# Patient Record
Sex: Female | Born: 1940
Health system: Southern US, Community
[De-identification: ages and names within clinical notes are randomized; demographics above are authoritative.]

## PROBLEM LIST (undated history)

## (undated) DIAGNOSIS — L719 Rosacea, unspecified: Secondary | ICD-10-CM

## (undated) DIAGNOSIS — T8859XA Other complications of anesthesia, initial encounter: Secondary | ICD-10-CM

## (undated) DIAGNOSIS — E0590 Thyrotoxicosis, unspecified without thyrotoxic crisis or storm: Secondary | ICD-10-CM

## (undated) DIAGNOSIS — K589 Irritable bowel syndrome without diarrhea: Secondary | ICD-10-CM

## (undated) DIAGNOSIS — R232 Flushing: Secondary | ICD-10-CM

## (undated) DIAGNOSIS — M4802 Spinal stenosis, cervical region: Secondary | ICD-10-CM

## (undated) DIAGNOSIS — R519 Headache, unspecified: Secondary | ICD-10-CM

## (undated) DIAGNOSIS — M1611 Unilateral primary osteoarthritis, right hip: Secondary | ICD-10-CM

## (undated) DIAGNOSIS — I10 Essential (primary) hypertension: Secondary | ICD-10-CM

## (undated) DIAGNOSIS — E785 Hyperlipidemia, unspecified: Secondary | ICD-10-CM

## (undated) DIAGNOSIS — Z9889 Other specified postprocedural states: Secondary | ICD-10-CM

## (undated) DIAGNOSIS — M1990 Unspecified osteoarthritis, unspecified site: Secondary | ICD-10-CM

## (undated) DIAGNOSIS — I639 Cerebral infarction, unspecified: Secondary | ICD-10-CM

## (undated) DIAGNOSIS — I480 Paroxysmal atrial fibrillation: Secondary | ICD-10-CM

## (undated) DIAGNOSIS — T4145XA Adverse effect of unspecified anesthetic, initial encounter: Secondary | ICD-10-CM

## (undated) DIAGNOSIS — R51 Headache: Secondary | ICD-10-CM

## (undated) DIAGNOSIS — R112 Nausea with vomiting, unspecified: Secondary | ICD-10-CM

## (undated) DIAGNOSIS — K5900 Constipation, unspecified: Secondary | ICD-10-CM

## (undated) HISTORY — PX: EYE SURGERY: SHX253

## (undated) HISTORY — PX: BACK SURGERY: SHX140

## (undated) HISTORY — PX: CARPAL TUNNEL RELEASE: SHX101

## (undated) HISTORY — PX: ABDOMINAL HYSTERECTOMY: SHX81

## (undated) HISTORY — PX: CATARACT EXTRACTION, BILATERAL: SHX1313

## (undated) HISTORY — PX: TUBAL LIGATION: SHX77

## (undated) HISTORY — PX: HEMORRHOID SURGERY: SHX153

## (undated) HISTORY — PX: CHOLECYSTECTOMY: SHX55

---

## 2007-03-15 HISTORY — PX: CARPAL TUNNEL RELEASE: SHX101

## 2007-07-25 ENCOUNTER — Ambulatory Visit (HOSPITAL_BASED_OUTPATIENT_CLINIC_OR_DEPARTMENT_OTHER): Admission: RE | Admit: 2007-07-25 | Discharge: 2007-07-25 | Payer: Self-pay | Admitting: *Deleted

## 2008-11-26 ENCOUNTER — Ambulatory Visit (HOSPITAL_BASED_OUTPATIENT_CLINIC_OR_DEPARTMENT_OTHER): Admission: RE | Admit: 2008-11-26 | Discharge: 2008-11-26 | Payer: Self-pay | Admitting: *Deleted

## 2009-12-01 DIAGNOSIS — I639 Cerebral infarction, unspecified: Secondary | ICD-10-CM

## 2009-12-01 HISTORY — DX: Cerebral infarction, unspecified: I63.9

## 2010-02-02 ENCOUNTER — Encounter: Admission: RE | Admit: 2010-02-02 | Discharge: 2010-02-02 | Payer: Self-pay | Admitting: Family Medicine

## 2010-06-18 LAB — POCT I-STAT, CHEM 8
Creatinine, Ser: 0.9 mg/dL (ref 0.4–1.2)
Glucose, Bld: 90 mg/dL (ref 70–99)
HCT: 45 % (ref 36.0–46.0)
Hemoglobin: 15.3 g/dL — ABNORMAL HIGH (ref 12.0–15.0)
Potassium: 3.4 mEq/L — ABNORMAL LOW (ref 3.5–5.1)
Sodium: 140 mEq/L (ref 135–145)
TCO2: 29 mmol/L (ref 0–100)

## 2010-07-27 NOTE — Op Note (Signed)
NAMELAKENZIE, MCCLAFFERTY NO.:  [id number]   MEDICAL RECORD NO.:  [id number]          PATIENT TYPE:  AMB   LOCATION:  DSC                          FACILITY:  [facility]   PHYSICIAN:  Tennis Must Meyerdierks, M.D.DATE OF BIRTH:  October 03, 1940   DATE OF PROCEDURE:  07/25/2007  DATE OF DISCHARGE:                               OPERATIVE REPORT   PREOPERATIVE DIAGNOSIS:  Right carpal tunnel syndrome.   POSTOPERATIVE DIAGNOSIS:  Right carpal tunnel syndrome.   PROCEDURE:  Decompression median nerve, right carpal tunnel.   SURGEON:  Lowell Bouton, MD   ANESTHESIA:  General.   OPERATIVE FINDINGS:  The patient had no masses in the carpal canal.  The  motor branch of the nerve was intact.   PROCEDURE:  Under 0.5% Marcaine local anesthesia with a tourniquet on  the right arm, the right hand was prepped and draped in usual fashion  and after exsanguinating the limb, the tourniquet was inflated to 250  mmHg.  A 3-cm longitudinal incision was made in the palm just ulnar to  the thenar crease and carried through the subcutaneous tissues.  Bleeding points were coagulated.  Blunt dissection was carried through  the superficial palmar fascia down to the transverse carpal ligament.  Hemostat was then placed in the carpal canal up against the hook of  hamate and the transverse carpal ligament was divided on the ulnar  border of the median nerve.  The proximal end of the ligament was  divided with scissors after dissecting the nerve away from the  undersurface of the ligament.  The carpal canal was then palpated and  was found to be adequately decompressed.  The nerve was examined and the  motor branch was identified.  The wound was then irrigated copiously  with saline.  Marcaine 0.5% was placed in the skin edges for pain  control.  The wound was closed with 4-0 nylon sutures.  Sterile  dressings were applied followed by a volar wrist splint.  The patient  had the tourniquet  released with good circulation of the hand.  She went  to the recovery room awake in stable and good condition.      Lowell Bouton, M.D.  Electronically Signed     EMM/MEDQ  D:  07/25/2007  T:  07/26/2007  Job:  [id number]

## 2014-04-01 ENCOUNTER — Other Ambulatory Visit (HOSPITAL_COMMUNITY): Payer: Self-pay | Admitting: Orthopaedic Surgery

## 2014-04-09 ENCOUNTER — Encounter (HOSPITAL_COMMUNITY): Payer: Self-pay

## 2014-04-09 ENCOUNTER — Encounter (HOSPITAL_COMMUNITY)
Admission: RE | Admit: 2014-04-09 | Discharge: 2014-04-09 | Disposition: A | Discharge status: Home / Self Care | Payer: PPO | Source: Ambulatory Visit | Attending: Orthopaedic Surgery | Admitting: Orthopaedic Surgery

## 2014-04-09 ENCOUNTER — Other Ambulatory Visit (HOSPITAL_COMMUNITY): Payer: Self-pay | Admitting: *Deleted

## 2014-04-09 DIAGNOSIS — Z01810 Encounter for preprocedural cardiovascular examination: Secondary | ICD-10-CM | POA: Insufficient documentation

## 2014-04-09 DIAGNOSIS — R9431 Abnormal electrocardiogram [ECG] [EKG]: Secondary | ICD-10-CM | POA: Insufficient documentation

## 2014-04-09 DIAGNOSIS — I10 Essential (primary) hypertension: Secondary | ICD-10-CM | POA: Insufficient documentation

## 2014-04-09 DIAGNOSIS — Z01812 Encounter for preprocedural laboratory examination: Secondary | ICD-10-CM | POA: Insufficient documentation

## 2014-04-09 DIAGNOSIS — M48061 Spinal stenosis, lumbar region without neurogenic claudication: Secondary | ICD-10-CM

## 2014-04-09 DIAGNOSIS — Z87891 Personal history of nicotine dependence: Secondary | ICD-10-CM | POA: Insufficient documentation

## 2014-04-09 DIAGNOSIS — Z01818 Encounter for other preprocedural examination: Secondary | ICD-10-CM | POA: Insufficient documentation

## 2014-04-09 HISTORY — DX: Essential (primary) hypertension: I10

## 2014-04-09 HISTORY — DX: Cerebral infarction, unspecified: I63.9

## 2014-04-09 HISTORY — DX: Unspecified osteoarthritis, unspecified site: M19.90

## 2014-04-09 HISTORY — DX: Irritable bowel syndrome, unspecified: K58.9

## 2014-04-09 HISTORY — DX: Rosacea, unspecified: L71.9

## 2014-04-09 LAB — TYPE AND SCREEN
ABO/RH(D): A POS
Antibody Screen: NEGATIVE

## 2014-04-09 LAB — CBC
HCT: 43.7 % (ref 36.0–46.0)
HEMOGLOBIN: 15.3 g/dL — AB (ref 12.0–15.0)
MCH: 30.8 pg (ref 26.0–34.0)
MCHC: 35 g/dL (ref 30.0–36.0)
MCV: 88.1 fL (ref 78.0–100.0)
PLATELETS: 170 10*3/uL (ref 150–400)
RBC: 4.96 MIL/uL (ref 3.87–5.11)
RDW: 13.2 % (ref 11.5–15.5)
WBC: 8 10*3/uL (ref 4.0–10.5)

## 2014-04-09 LAB — COMPREHENSIVE METABOLIC PANEL
ALBUMIN: 4.1 g/dL (ref 3.5–5.2)
ALT: 26 U/L (ref 0–35)
ANION GAP: 8 (ref 5–15)
AST: 24 U/L (ref 0–37)
Alkaline Phosphatase: 77 U/L (ref 39–117)
BUN: 17 mg/dL (ref 6–23)
CALCIUM: 9.4 mg/dL (ref 8.4–10.5)
CHLORIDE: 107 mmol/L (ref 96–112)
CO2: 24 mmol/L (ref 19–32)
CREATININE: 0.8 mg/dL (ref 0.50–1.10)
GFR, EST AFRICAN AMERICAN: 83 mL/min — AB (ref >90)
GFR, EST NON AFRICAN AMERICAN: 71 mL/min — AB (ref >90)
GLUCOSE: 103 mg/dL — AB (ref 70–99)
POTASSIUM: 4.1 mmol/L (ref 3.5–5.1)
SODIUM: 139 mmol/L (ref 135–145)
TOTAL PROTEIN: 6.7 g/dL (ref 6.0–8.3)
Total Bilirubin: 1.3 mg/dL — ABNORMAL HIGH (ref 0.3–1.2)

## 2014-04-09 LAB — URINALYSIS, ROUTINE W REFLEX MICROSCOPIC
Bilirubin Urine: NEGATIVE
Glucose, UA: NEGATIVE mg/dL
Hgb urine dipstick: NEGATIVE
Ketones, ur: NEGATIVE mg/dL
LEUKOCYTES UA: NEGATIVE
Nitrite: NEGATIVE
PH: 5.5 (ref 5.0–8.0)
Protein, ur: NEGATIVE mg/dL
Specific Gravity, Urine: 1.021 (ref 1.005–1.030)
Urobilinogen, UA: 0.2 mg/dL (ref 0.0–1.0)

## 2014-04-09 LAB — SURGICAL PCR SCREEN
MRSA, PCR: NEGATIVE
Staphylococcus aureus: NEGATIVE

## 2014-04-09 LAB — PROTIME-INR
INR: 1.04 (ref 0.00–1.49)
PROTHROMBIN TIME: 13.7 s (ref 11.6–15.2)

## 2014-04-09 LAB — ABO/RH: ABO/RH(D): A POS

## 2014-04-09 NOTE — Pre-Procedure Instructions (Signed)
SALEM Booker  04/09/2014   Your procedure is scheduled on:  Wednesday, April 16, 2014 at 12:30 PM.   Report to [facility] Entrance "A" Admitting Office at 10:30 AM.   Call this number if you have problems the morning of surgery: [phone]               Any questions prior to day of surgery, please call [phone] between 8 & 4 PM.    Remember:   Do not eat food or drink liquids after midnight Tuesday, 04/15/14.  Take these medicines the morning of surgery with A SIP OF WATER: Bupropion (Wellbutrin), Dicyclomine (Bentyl) - if needed  Stop Aspirin as of today.   Do not wear jewelry, make-up or nail polish.  Do not wear lotions, powders, or perfumes. You may wear deodorant.  Do not shave 48 hours prior to surgery.   Do not bring valuables to the hospital.  [facility] is not responsible                  for any belongings or valuables.               Contacts, dentures or bridgework may not be worn into surgery.  Leave suitcase in the car. After surgery it may be brought to your room.  For patients admitted to the hospital, discharge time is determined by your                treatment team.            Special Instructions: [facility] - Preparing for Surgery  Before surgery, you can play an important role.  Because skin is not sterile, your skin needs to be as free of germs as possible.  You can reduce the number of germs on you skin by washing with CHG (chlorahexidine gluconate) soap before surgery.  CHG is an antiseptic cleaner which kills germs and bonds with the skin to continue killing germs even after washing.  Please DO NOT use if you have an allergy to CHG or antibacterial soaps.  If your skin becomes reddened/irritated stop using the CHG and inform your nurse when you arrive at Short Stay.  Do not shave (including legs and underarms) for at least 48 hours prior to the first CHG shower.  You may shave your face.  Please follow these instructions  carefully:   1.  Shower with CHG Soap the night before surgery and the                                morning of Surgery.  2.  If you choose to wash your hair, wash your hair first as usual with your       normal shampoo.  3.  After you shampoo, rinse your hair and body thoroughly to remove the                      Shampoo.  4.  Use CHG as you would any other liquid soap.  You can apply chg directly       to the skin and wash gently with scrungie or a clean washcloth.  5.  Apply the CHG Soap to your body ONLY FROM THE NECK DOWN.        Do not use on open wounds or open sores.  Avoid contact with your eyes, ears, mouth and genitals (private parts).  Wash genitals (private parts) with your normal soap.  6.  Wash thoroughly, paying special attention to the area where your surgery        will be performed.  7.  Thoroughly rinse your body with warm water from the neck down.  8.  DO NOT shower/wash with your normal soap after using and rinsing off       the CHG Soap.  9.  Pat yourself dry with a clean towel.            10.  Wear clean pajamas.            11.  Place clean sheets on your bed the night of your first shower and do not        sleep with pets.  Day of Surgery  Do not apply any lotions the morning of surgery.  Please wear clean clothes to the hospital.     Please read over the following fact sheets that you were given: Pain Booklet, Coughing and Deep Breathing, Blood Transfusion Information, MRSA Information and Surgical Site Infection Prevention

## 2014-04-09 NOTE — Progress Notes (Signed)
Pt denies chest pain or sob. States she had a stress test and Echo done in 2011 because she had some left "breast" pain and her physician wanted to rule out her heart. She states those were "fine". Requested copies of Echo, stress test and recent EKG from [facility] in [place].

## 2014-04-10 NOTE — Progress Notes (Addendum)
Anesthesia Chart Review:  Pt is 74 year old female scheduled for L4 Gill procedure, L4-5 decompression and fusion, cage, pedicle instrumentation on 04/16/2014 with Dr. Lorin Mercy.   Cardiologist is [facility] at [employer].   PMH includes: HTN, stroke 2011. Former smoker. BMI 34  Preoperative labs reviewed.    EKG: NSR. Inferior infarct , age undetermined. Poor R wave progression.  Pt reported to PAT RN had stress and echo in 2011 for L breast pain and results came back normal. Requested records from [facility]. Will update note when records received.   Willeen Cass, FNP-BC [facility] Short Stay Surgical Center/Anesthesiology Phone: [phone] 04/10/2014 3:13 PM   Addendum: Still awaiting records.   Discussed with Dr. Tobias Alexander. If stress and echo from 2011 normal, pt may proceed with surgery as scheduled.   Willeen Cass, FNP-BC [facility] Short Stay Surgical Center/Anesthesiology Phone: [phone] 04/11/2014 4:42 PM  Addendum 04/14/2014:  Received records from [place].   Stress echo 11/05/2013: No evidence stress-induced wall motion abnormalities. Poor exercise capacity.   If no changes, I anticipate pt can proceed with surgery as scheduled.   Willeen Cass, FNP-BC [facility] Short Stay Surgical Center/Anesthesiology Phone: [phone] 04/14/2014 2:51 PM

## 2014-04-15 MED ORDER — CEFAZOLIN SODIUM-DEXTROSE 2-3 GM-% IV SOLR
2.0000 g | INTRAVENOUS | Status: AC
  Administered 2014-04-16: 2 g via INTRAVENOUS
  Filled 2014-04-15: qty 50

## 2014-04-15 NOTE — H&P (Signed)
[facility]   A Division of [employer]   [address] [place], [place] [address] Telephone: [phone]  Fax: [phone]     PATIENT: Christina Booker, Christina Booker   MR#: [id number]  DOB: 28-Aug-1940   Visit Date: 03/18/2014     CHIEF COMPLAINT:  Neurogenic claudication and L4-5 severe stenosis with spondylolisthesis.   HISTORY OF PRESENT ILLNESS:  Patient returns with persistent problems with back pain, leg weakness, inability to stand more than 4 to 5 minutes, inability to walk more than a half block.  She gets relief with standing.  She is better if she is leaning over a grocery cart and can walk much further.  She has increased pain with extension, better with flexion.   MEDICATIONS:  Reviewed and include atorvastatin 20 mg daily, bupropion 150 mg daily, lisinopril 5 mg daily, aspirin 325 p.o. daily, vitamin D3 1000 mg b.i.d., MiraLAX p.r.n., Benefiber 1/2 tablespoon q. a.m., zolpidem 10 mg p.r.n., and dicyclomine 10 mg p.r.n.    ALLERGIES:  PENICILLIN which caused a rash.    PAST SURGICAL HISTORY:  Includes bilateral carpal tunnel releases, right in 2009 and left in 2010.  Tubal ligation in 1977, hysterectomy in 1989.  Impacted colon in 1994.  TIA in 2011.   FAMILY HISTORY:  Positive for diabetes, heart disease, multiple myeloma, and uterine cancer.   SOCIAL HISTORY:  Patient is married to her husband, Cletus Gash, who is with her today.  Patient is retired.  She used to smoke 1 pack per day for 16 years.  She drinks 2 or 3 drinks of alcohol a week.   REVIEW OF SYSTEMS:  Positive for arthritis, previous TIA, hypertension, and increased cholesterol.   PHYSICAL EXAMINATION:  Patient is alert and oriented, WD, WN, NAD.  Extraocular movements intact.  Reflexes are 2+ and symmetrical.  Upper extremity range of motion is full.  No evidence of synovitis.  Lungs:  Clear.  Heart:  Regular rate and rhythm.  Normal coordination of the upper extremities.  Hip range  of motion and FABER test are negative.  Quads and hamstrings are strong.  Previous plain radiographs showed some degenerative changes at the L4-5 level.  Dital pulses 2+.  Anterior tib EHL, gastrocsoleus, quads, and hamstrings are normal to testing.  Nail beds are normal.   IMAGING:  Old x-rays demonstrate a grade 1/2 spondylolisthesis at L4-5, slight calcification of the abdominal aorta just cephalad to the iliac bifurcation.  Mild spurring at L1-2 anteriorly.   MRI on 03/17/2014 shows left extraforaminal disk at L3-4 unchanged, 2 mm of retrolisthesis at L2-3 with some narrowing and stenosis.  L4-5 shows 5 mm-plus spondylolisthesis which is increased since the prior MRI 3 years ago.  Severe facet arthritis, significant severe effusion, hypertrophy in the ligaments, and severe compression of the thecal sac which is compressed and progressed.  She has bilateral lateral recess compression.  Moderate facet changes at L5-S1.   ASSESSMENT/PLAN:  Instability and stenosis L4-5, instrumented fusion with Artis Delay procedure L4-5, interbody spacer.  Patient's own bone.  Risks of surgery discussed.  All questions answered.  Risks of bleeding, infection, dural tear, pseudarthrosis, progression at adjacent level, reoperation, pedicle screw malposition all discussed.  Risks of anesthesia, potential for heart attack and death all discussed.  She is seeing Dr. Bea Graff and will need to have preoperative clearance.   For additional information please see handwritten notes, reports, orders and prescriptions in this chart.      Mark C. Lorin Mercy, M.D.  Auto-Authenticated by Thana Farr. Lorin Mercy, M.D.  MCY/lm DD: 03/20/2014  DT: 03/20/2014   cc: Gilford Rile, MD([employer])   [facility]([employer])

## 2014-04-16 ENCOUNTER — Inpatient Hospital Stay (HOSPITAL_COMMUNITY): Payer: PPO | Admitting: Anesthesiology

## 2014-04-16 ENCOUNTER — Inpatient Hospital Stay (HOSPITAL_COMMUNITY)
Admission: RE | Admit: 2014-04-16 | Discharge: 2014-04-19 | DRG: 460 | Disposition: A | Discharge status: Home Health | Payer: PPO | Source: Ambulatory Visit | Attending: Orthopaedic Surgery | Admitting: Orthopaedic Surgery

## 2014-04-16 ENCOUNTER — Encounter (HOSPITAL_COMMUNITY)
Admission: RE | Disposition: A | Discharge status: Home Health | Payer: PPO | Source: Ambulatory Visit | Attending: Orthopaedic Surgery

## 2014-04-16 ENCOUNTER — Encounter (HOSPITAL_COMMUNITY): Payer: Self-pay | Admitting: *Deleted

## 2014-04-16 ENCOUNTER — Inpatient Hospital Stay (HOSPITAL_COMMUNITY): Payer: PPO | Admitting: Emergency Medicine

## 2014-04-16 ENCOUNTER — Inpatient Hospital Stay (HOSPITAL_COMMUNITY): Payer: PPO

## 2014-04-16 DIAGNOSIS — M48061 Spinal stenosis, lumbar region without neurogenic claudication: Secondary | ICD-10-CM

## 2014-04-16 DIAGNOSIS — Z8673 Personal history of transient ischemic attack (TIA), and cerebral infarction without residual deficits: Secondary | ICD-10-CM | POA: Diagnosis not present

## 2014-04-16 DIAGNOSIS — Z87891 Personal history of nicotine dependence: Secondary | ICD-10-CM

## 2014-04-16 DIAGNOSIS — M4806 Spinal stenosis, lumbar region: Secondary | ICD-10-CM | POA: Diagnosis present

## 2014-04-16 DIAGNOSIS — M4316 Spondylolisthesis, lumbar region: Principal | ICD-10-CM | POA: Diagnosis present

## 2014-04-16 DIAGNOSIS — Z981 Arthrodesis status: Secondary | ICD-10-CM

## 2014-04-16 DIAGNOSIS — Z79899 Other long term (current) drug therapy: Secondary | ICD-10-CM

## 2014-04-16 DIAGNOSIS — Z7982 Long term (current) use of aspirin: Secondary | ICD-10-CM | POA: Diagnosis not present

## 2014-04-16 DIAGNOSIS — M549 Dorsalgia, unspecified: Secondary | ICD-10-CM | POA: Diagnosis present

## 2014-04-16 SURGERY — POSTERIOR LUMBAR FUSION 1 LEVEL
Anesthesia: General

## 2014-04-16 MED ORDER — PHENYLEPHRINE HCL 10 MG/ML IJ SOLN
10.0000 mg | INTRAVENOUS | Status: DC | PRN
  Administered 2014-04-16: 50 ug/min via INTRAVENOUS

## 2014-04-16 MED ORDER — PROMETHAZINE HCL 25 MG/ML IJ SOLN
INTRAMUSCULAR | Status: AC
  Administered 2014-04-16: 6.25 mg via INTRAVENOUS
  Filled 2014-04-16: qty 1

## 2014-04-16 MED ORDER — SUCCINYLCHOLINE CHLORIDE 20 MG/ML IJ SOLN
INTRAMUSCULAR | Status: AC
  Filled 2014-04-16: qty 1

## 2014-04-16 MED ORDER — CHLORHEXIDINE GLUCONATE 4 % EX LIQD
60.0000 mL | Freq: Once | CUTANEOUS | Status: DC
  Filled 2014-04-16: qty 60

## 2014-04-16 MED ORDER — GLYCOPYRROLATE 0.2 MG/ML IJ SOLN
INTRAMUSCULAR | Status: AC
Start: 2014-04-16 — End: 2014-04-16
  Filled 2014-04-16: qty 1

## 2014-04-16 MED ORDER — HYDROMORPHONE HCL 1 MG/ML IJ SOLN
0.2500 mg | INTRAMUSCULAR | Status: DC | PRN
  Administered 2014-04-16: 1 mg via INTRAVENOUS

## 2014-04-16 MED ORDER — SURGIFOAM 100 EX MISC
CUTANEOUS | Status: DC | PRN
  Administered 2014-04-16: 20000 mL via TOPICAL

## 2014-04-16 MED ORDER — POLYETHYLENE GLYCOL 3350 17 G PO PACK
17.0000 g | PACK | Freq: Every day | ORAL | Status: DC | PRN
  Administered 2014-04-18: 17 g via ORAL
  Filled 2014-04-16 (×2): qty 1

## 2014-04-16 MED ORDER — ONDANSETRON HCL 4 MG PO TABS: 4.0000 mg | ORAL_TABLET | Freq: Four times a day (QID) | ORAL | Status: DC | PRN

## 2014-04-16 MED ORDER — OXYCODONE-ACETAMINOPHEN 5-325 MG PO TABS
1.0000 | ORAL_TABLET | Freq: Four times a day (QID) | ORAL | Status: DC | PRN
  Administered 2014-04-16 – 2014-04-19 (×8): 1 via ORAL
  Filled 2014-04-16 (×9): qty 1

## 2014-04-16 MED ORDER — OXYCODONE HCL 5 MG PO TABS
5.0000 mg | ORAL_TABLET | ORAL | Status: DC | PRN
  Administered 2014-04-17 – 2014-04-19 (×6): 5 mg via ORAL
  Filled 2014-04-16 (×6): qty 1

## 2014-04-16 MED ORDER — KETOROLAC TROMETHAMINE 30 MG/ML IJ SOLN
30.0000 mg | Freq: Once | INTRAMUSCULAR | Status: AC
  Administered 2014-04-16: 30 mg via INTRAVENOUS

## 2014-04-16 MED ORDER — POTASSIUM CHLORIDE IN NACL 20-0.45 MEQ/L-% IV SOLN
INTRAVENOUS | Status: DC
  Administered 2014-04-17: via INTRAVENOUS
  Filled 2014-04-16 (×8): qty 1000

## 2014-04-16 MED ORDER — LACTATED RINGERS IV SOLN
INTRAVENOUS | Status: DC
  Administered 2014-04-16 (×2): via INTRAVENOUS

## 2014-04-16 MED ORDER — ROCURONIUM BROMIDE 100 MG/10ML IV SOLN
INTRAVENOUS | Status: DC | PRN
  Administered 2014-04-16: 40 mg via INTRAVENOUS

## 2014-04-16 MED ORDER — METOCLOPRAMIDE HCL 5 MG/ML IJ SOLN: 5.0000 mg | Freq: Three times a day (TID) | INTRAMUSCULAR | Status: DC | PRN

## 2014-04-16 MED ORDER — BUPIVACAINE-EPINEPHRINE (PF) 0.25% -1:200000 IJ SOLN
INTRAMUSCULAR | Status: AC
  Filled 2014-04-16: qty 30

## 2014-04-16 MED ORDER — PROPOFOL 10 MG/ML IV BOLUS
INTRAVENOUS | Status: DC | PRN
  Administered 2014-04-16: 140 mg via INTRAVENOUS

## 2014-04-16 MED ORDER — DICYCLOMINE HCL 10 MG PO CAPS
10.0000 mg | ORAL_CAPSULE | Freq: Two times a day (BID) | ORAL | Status: DC | PRN
  Administered 2014-04-19: 10 mg via ORAL
  Filled 2014-04-16: qty 1

## 2014-04-16 MED ORDER — SUFENTANIL CITRATE 50 MCG/ML IV SOLN
INTRAVENOUS | Status: AC
  Filled 2014-04-16: qty 1

## 2014-04-16 MED ORDER — BUPROPION HCL ER (XL) 150 MG PO TB24
150.0000 mg | ORAL_TABLET | Freq: Every morning | ORAL | Status: DC
  Administered 2014-04-17 – 2014-04-19 (×3): 150 mg via ORAL
  Filled 2014-04-16 (×3): qty 1

## 2014-04-16 MED ORDER — MIDAZOLAM HCL 5 MG/5ML IJ SOLN
INTRAMUSCULAR | Status: DC | PRN
  Administered 2014-04-16: 1 mg via INTRAVENOUS

## 2014-04-16 MED ORDER — KETOROLAC TROMETHAMINE 30 MG/ML IJ SOLN
INTRAMUSCULAR | Status: AC
  Administered 2014-04-16: 30 mg via INTRAVENOUS
  Filled 2014-04-16: qty 1

## 2014-04-16 MED ORDER — METHOCARBAMOL 500 MG PO TABS
500.0000 mg | ORAL_TABLET | Freq: Four times a day (QID) | ORAL | Status: DC | PRN
  Administered 2014-04-16 – 2014-04-19 (×7): 500 mg via ORAL
  Filled 2014-04-16 (×8): qty 1

## 2014-04-16 MED ORDER — PHENYLEPHRINE HCL 10 MG/ML IJ SOLN
INTRAMUSCULAR | Status: DC | PRN
  Administered 2014-04-16: 80 ug via INTRAVENOUS

## 2014-04-16 MED ORDER — METHOCARBAMOL 1000 MG/10ML IJ SOLN
500.0000 mg | Freq: Four times a day (QID) | INTRAVENOUS | Status: DC | PRN
  Filled 2014-04-16: qty 5

## 2014-04-16 MED ORDER — HYDROMORPHONE HCL 1 MG/ML IJ SOLN
0.5000 mg | INTRAMUSCULAR | Status: DC | PRN
  Administered 2014-04-17: 0.5 mg via INTRAVENOUS
  Filled 2014-04-16: qty 1

## 2014-04-16 MED ORDER — PROMETHAZINE HCL 25 MG/ML IJ SOLN
6.2500 mg | INTRAMUSCULAR | Status: DC | PRN
  Administered 2014-04-16: 6.25 mg via INTRAVENOUS

## 2014-04-16 MED ORDER — ONDANSETRON HCL 4 MG/2ML IJ SOLN
4.0000 mg | Freq: Four times a day (QID) | INTRAMUSCULAR | Status: DC | PRN
  Administered 2014-04-16: 4 mg via INTRAVENOUS
  Filled 2014-04-16: qty 2

## 2014-04-16 MED ORDER — SUFENTANIL CITRATE 50 MCG/ML IV SOLN
INTRAVENOUS | Status: DC | PRN
  Administered 2014-04-16: 10 ug via INTRAVENOUS
  Administered 2014-04-16 (×2): 5 ug via INTRAVENOUS
  Administered 2014-04-16: 20 ug via INTRAVENOUS

## 2014-04-16 MED ORDER — OXYCODONE HCL 5 MG/5ML PO SOLN: 5.0000 mg | Freq: Once | ORAL | Status: DC | PRN

## 2014-04-16 MED ORDER — LISINOPRIL 5 MG PO TABS
5.0000 mg | ORAL_TABLET | Freq: Every day | ORAL | Status: DC
  Administered 2014-04-16: 5 mg via ORAL
  Filled 2014-04-16 (×4): qty 1

## 2014-04-16 MED ORDER — THROMBIN 20000 UNITS EX SOLR
CUTANEOUS | Status: AC
  Filled 2014-04-16: qty 20000

## 2014-04-16 MED ORDER — BISACODYL 10 MG RE SUPP
10.0000 mg | Freq: Every day | RECTAL | Status: DC | PRN
  Administered 2014-04-18 – 2014-04-19 (×2): 10 mg via RECTAL
  Filled 2014-04-16 (×2): qty 1

## 2014-04-16 MED ORDER — GLYCOPYRROLATE 0.2 MG/ML IJ SOLN
INTRAMUSCULAR | Status: DC | PRN
  Administered 2014-04-16: 0.2 mg via INTRAVENOUS

## 2014-04-16 MED ORDER — BUPIVACAINE-EPINEPHRINE 0.25% -1:200000 IJ SOLN
INTRAMUSCULAR | Status: DC | PRN
  Administered 2014-04-16: 15 mL

## 2014-04-16 MED ORDER — PROPOFOL 10 MG/ML IV BOLUS
INTRAVENOUS | Status: AC
  Filled 2014-04-16: qty 20

## 2014-04-16 MED ORDER — EPHEDRINE SULFATE 50 MG/ML IJ SOLN
INTRAMUSCULAR | Status: DC | PRN
  Administered 2014-04-16: 10 mg via INTRAVENOUS
  Administered 2014-04-16 (×3): 5 mg via INTRAVENOUS

## 2014-04-16 MED ORDER — 0.9 % SODIUM CHLORIDE (POUR BTL) OPTIME
TOPICAL | Status: DC | PRN
  Administered 2014-04-16: 1000 mL

## 2014-04-16 MED ORDER — DOCUSATE SODIUM 100 MG PO CAPS
100.0000 mg | ORAL_CAPSULE | Freq: Two times a day (BID) | ORAL | Status: DC
  Administered 2014-04-16 – 2014-04-19 (×6): 100 mg via ORAL
  Filled 2014-04-16 (×6): qty 1

## 2014-04-16 MED ORDER — LIDOCAINE HCL (CARDIAC) 20 MG/ML IV SOLN
INTRAVENOUS | Status: DC | PRN
  Administered 2014-04-16: 100 mg via INTRAVENOUS

## 2014-04-16 MED ORDER — HYDROMORPHONE HCL 1 MG/ML IJ SOLN
INTRAMUSCULAR | Status: AC
  Administered 2014-04-16: 1 mg via INTRAVENOUS
  Filled 2014-04-16: qty 1

## 2014-04-16 MED ORDER — METOCLOPRAMIDE HCL 10 MG PO TABS: 5.0000 mg | ORAL_TABLET | Freq: Three times a day (TID) | ORAL | Status: DC | PRN

## 2014-04-16 MED ORDER — OXYCODONE HCL 5 MG PO TABS: 5.0000 mg | ORAL_TABLET | Freq: Once | ORAL | Status: DC | PRN

## 2014-04-16 MED ORDER — ATORVASTATIN CALCIUM 20 MG PO TABS
20.0000 mg | ORAL_TABLET | Freq: Every day | ORAL | Status: DC
  Administered 2014-04-16 – 2014-04-18 (×3): 20 mg via ORAL
  Filled 2014-04-16 (×4): qty 1

## 2014-04-16 SURGICAL SUPPLY — 67 items
BLADE SURG ROTATE 9660 (MISCELLANEOUS) IMPLANT
BUR ROUND FLUTED 4 SOFT TCH (BURR) ×2 IMPLANT
BUR ROUND FLUTED 4MM SOFT TCH (BURR) ×1
CAP SPINAL LOCKING TI (Cap) ×12 IMPLANT
CORDS BIPOLAR (ELECTRODE) ×3 IMPLANT
COVER SURGICAL LIGHT HANDLE (MISCELLANEOUS) ×3 IMPLANT
DERMABOND ADHESIVE PROPEN (GAUZE/BANDAGES/DRESSINGS) ×2
DERMABOND ADVANCED (GAUZE/BANDAGES/DRESSINGS) ×2
DERMABOND ADVANCED .7 DNX12 (GAUZE/BANDAGES/DRESSINGS) ×1 IMPLANT
DERMABOND ADVANCED .7 DNX6 (GAUZE/BANDAGES/DRESSINGS) ×1 IMPLANT
DRAPE C-ARM 42X72 X-RAY (DRAPES) ×3 IMPLANT
DRAPE MICROSCOPE LEICA (MISCELLANEOUS) ×3 IMPLANT
DRAPE SURG 17X23 STRL (DRAPES) ×9 IMPLANT
DRAPE TABLE COVER HEAVY DUTY (DRAPES) ×3 IMPLANT
DRSG EMULSION OIL 3X3 NADH (GAUZE/BANDAGES/DRESSINGS) ×3 IMPLANT
DRSG MEPILEX BORDER 4X4 (GAUZE/BANDAGES/DRESSINGS) ×3 IMPLANT
DRSG MEPILEX BORDER 4X8 (GAUZE/BANDAGES/DRESSINGS) ×3 IMPLANT
DRSG PAD ABDOMINAL 8X10 ST (GAUZE/BANDAGES/DRESSINGS) ×6 IMPLANT
DURAPREP 26ML APPLICATOR (WOUND CARE) ×3 IMPLANT
ELECT BLADE 4.0 EZ CLEAN MEGAD (MISCELLANEOUS) ×3
ELECT CAUTERY BLADE 6.4 (BLADE) ×3 IMPLANT
ELECT REM PT RETURN 9FT ADLT (ELECTROSURGICAL) ×3
ELECTRODE BLDE 4.0 EZ CLN MEGD (MISCELLANEOUS) ×1 IMPLANT
ELECTRODE REM PT RTRN 9FT ADLT (ELECTROSURGICAL) ×1 IMPLANT
EVACUATOR 1/8 PVC DRAIN (DRAIN) ×3 IMPLANT
GLOVE BIOGEL PI IND STRL 6.5 (GLOVE) ×1 IMPLANT
GLOVE BIOGEL PI IND STRL 8 (GLOVE) ×2 IMPLANT
GLOVE BIOGEL PI INDICATOR 6.5 (GLOVE) ×2
GLOVE BIOGEL PI INDICATOR 8 (GLOVE) ×4
GLOVE BIOGEL PI ORTHO PRO 7.5 (GLOVE) ×2
GLOVE ECLIPSE 7.0 STRL STRAW (GLOVE) ×3 IMPLANT
GLOVE ORTHO TXT STRL SZ7.5 (GLOVE) ×3 IMPLANT
GLOVE PI ORTHO PRO STRL 7.5 (GLOVE) ×1 IMPLANT
GOWN STRL REUS W/ TWL LRG LVL3 (GOWN DISPOSABLE) ×3 IMPLANT
GOWN STRL REUS W/TWL LRG LVL3 (GOWN DISPOSABLE) ×6
HEMOSTAT SURGICEL 2X14 (HEMOSTASIS) IMPLANT
KIT BASIN OR (CUSTOM PROCEDURE TRAY) ×3 IMPLANT
KIT POSITION SURG JACKSON T1 (MISCELLANEOUS) ×3 IMPLANT
KIT ROOM TURNOVER OR (KITS) ×3 IMPLANT
MANIFOLD NEPTUNE II (INSTRUMENTS) ×3 IMPLANT
NDL SUT .5 MAYO 1.404X.05X (NEEDLE) ×1 IMPLANT
NEEDLE MAYO TAPER (NEEDLE) ×2
NS IRRIG 1000ML POUR BTL (IV SOLUTION) ×3 IMPLANT
PACK LAMINECTOMY ORTHO (CUSTOM PROCEDURE TRAY) ×3 IMPLANT
PAD ARMBOARD 7.5X6 YLW CONV (MISCELLANEOUS) ×6 IMPLANT
PATTIES SURGICAL .5 X.5 (GAUZE/BANDAGES/DRESSINGS) ×3 IMPLANT
PATTIES SURGICAL .75X.75 (GAUZE/BANDAGES/DRESSINGS) IMPLANT
ROD 45MM (Rod) ×4 IMPLANT
ROD SPNL CVD 45X5.5XHRD NS (Rod) ×2 IMPLANT
SCREW MATRIX MIS 6.0X45MM (Screw) ×12 IMPLANT
SPACER CONCORDE CUR 5DEG L10MM (Orthopedic Implant) ×3 IMPLANT
SPONGE LAP 18X18 X RAY DECT (DISPOSABLE) ×3 IMPLANT
SPONGE LAP 4X18 X RAY DECT (DISPOSABLE) ×6 IMPLANT
SPONGE SURGIFOAM ABS GEL 100 (HEMOSTASIS) IMPLANT
STAPLER VISISTAT 35W (STAPLE) IMPLANT
SURGIFLO TRUKIT (HEMOSTASIS) IMPLANT
SUT BONE WAX W31G (SUTURE) ×3 IMPLANT
SUT VIC AB 2-0 CT1 27 (SUTURE) ×2
SUT VIC AB 2-0 CT1 TAPERPNT 27 (SUTURE) ×1 IMPLANT
SUT VICRYL 0 TIES 12 18 (SUTURE) ×3 IMPLANT
SUT VICRYL 4-0 PS2 18IN ABS (SUTURE) ×3 IMPLANT
SUT VICRYL AB 2 0 TIES (SUTURE) ×3 IMPLANT
TOWEL OR 17X24 6PK STRL BLUE (TOWEL DISPOSABLE) ×3 IMPLANT
TOWEL OR 17X26 10 PK STRL BLUE (TOWEL DISPOSABLE) ×3 IMPLANT
TRAY FOLEY CATH 16FRSI W/METER (SET/KITS/TRAYS/PACK) ×3 IMPLANT
WATER STERILE IRR 1000ML POUR (IV SOLUTION) ×3 IMPLANT
YANKAUER SUCT BULB TIP NO VENT (SUCTIONS) ×3 IMPLANT

## 2014-04-16 NOTE — Transfer of Care (Signed)
Immediate Anesthesia Transfer of Care Note  Patient: Christina Booker  Procedure(s) Performed: Procedure(s): L4 Gill Procedure, L4-5 Decompression and Fusion, Cage, Pedicle Instrumentation (N/A)  Patient Location: PACU  Anesthesia Type:General  Level of Consciousness: awake, alert  and oriented  Airway & Oxygen Therapy: Patient Spontanous Breathing and Patient connected to nasal cannula oxygen  Post-op Assessment: Report given to RN, Post -op Vital signs reviewed and stable and Patient moving all extremities  Post vital signs: Reviewed and stable  Last Vitals:  Filed Vitals:   04/16/14 1023  BP: 175/78  Pulse: 70  Temp: 36.6 C  Resp: 20    Complications: No apparent anesthesia complications

## 2014-04-16 NOTE — Anesthesia Preprocedure Evaluation (Addendum)
Anesthesia Evaluation  Patient identified by MRN, date of birth, ID band Patient awake    Reviewed: Allergy & Precautions, NPO status , Patient's Chart, lab work & pertinent test results  Airway Mallampati: II  TM Distance: >3 FB Neck ROM: Full    Dental   Pulmonary former smoker,  breath sounds clear to auscultation        Cardiovascular hypertension, Pt. on medications Rhythm:Regular Rate:Normal  8/15 Normal stress test with no evidence inducible ischemia.   Neuro/Psych TIA   GI/Hepatic negative GI ROS, Neg liver ROS,   Endo/Other  Morbid obesity  Renal/GU negative Renal ROS     Musculoskeletal  (+) Arthritis -,   Abdominal   Peds  Hematology negative hematology ROS (+)   Anesthesia Other Findings   Reproductive/Obstetrics                            Anesthesia Physical Anesthesia Plan  ASA: III  Anesthesia Plan: General   Post-op Pain Management:    Induction: Intravenous  Airway Management Planned: Oral ETT  Additional Equipment:   Intra-op Plan:   Post-operative Plan: Extubation in OR  Informed Consent: I have reviewed the patients History and Physical, chart, labs and discussed the procedure including the risks, benefits and alternatives for the proposed anesthesia with the patient or authorized representative who has indicated his/her understanding and acceptance.     Plan Discussed with: CRNA  Anesthesia Plan Comments:         Anesthesia Quick Evaluation

## 2014-04-16 NOTE — Interval H&P Note (Signed)
History and Physical Interval Note:  04/16/2014 12:27 PM  Christina Booker  has presented today for surgery, with the diagnosis of L4-5 Severe Stenosis, Spondylolisthesis  The various methods of treatment have been discussed with the patient and family. After consideration of risks, benefits and other options for treatment, the patient has consented to  Procedure(s): L4 Gill Procedure, L4-5 Decompression and Fusion, Cage, Pedicle Instrumentation (N/A) as a surgical intervention .  The patient's history has been reviewed, patient examined, no change in status, stable for surgery.  I have reviewed the patient's chart and labs.  Questions were answered to the patient's satisfaction.     Caitrin Pendergraph C

## 2014-04-16 NOTE — Brief Op Note (Signed)
04/16/2014  4:35 PM  PATIENT:  Christina Booker  74 y.o. female  PRE-OPERATIVE DIAGNOSIS:  L4-5 Severe Stenosis, Spondylolisthesis  POST-OPERATIVE DIAGNOSIS:  L4-5 Severe Stenosis, Spondylolisthesis  PROCEDURE:  Procedure(s): L4 Gill Procedure, L4-5 Decompression and Fusion, Cage, Pedicle Instrumentation (N/A)  SURGEON:  Surgeon(s) and Role:    * Marybelle Killings, MD - Primary  PHYSICIAN ASSISTANT: Benjiman Core pa-c   ANESTHESIA:   general  EBL:  Total I/O In: 6546 [I.V.:1000; Blood:450] Out: 1125 [Urine:175; Blood:950]   DRAINS: none   LOCAL MEDICATIONS USED:  MARCAINE     SPECIMEN:  No Specimen  DISPOSITION OF SPECIMEN:  N/A  COUNTS:  YES  TOURNIQUET:  * No tourniquets in log *

## 2014-04-16 NOTE — Progress Notes (Signed)
Orthopedic Tech Progress Note Patient Details:  Christina Booker 03/27/1940 [id number] Called brace order in to [employer]. Patient ID: [name], female   DOB: 03-May-1940, 74 y.o.   MRN: [id number]   Darrol Poke 04/16/2014, 5:09 PM

## 2014-04-16 NOTE — Anesthesia Postprocedure Evaluation (Signed)
Anesthesia Post Note  Patient: Christina Booker  Procedure(s) Performed: Procedure(s) (LRB): L4 Gill Procedure, L4-5 Decompression and Fusion, Cage, Pedicle Instrumentation (N/A)  Anesthesia type: general  Patient location: PACU  Post pain: Pain level controlled  Post assessment: Patient's Cardiovascular Status Stable  Last Vitals:  Filed Vitals:   04/16/14 1730  BP: 145/86  Pulse: 82  Temp:   Resp:     Post vital signs: Reviewed and stable  Level of consciousness: sedated  Complications: No apparent anesthesia complications

## 2014-04-17 LAB — COMPREHENSIVE METABOLIC PANEL
ALBUMIN: 2.6 g/dL — AB (ref 3.5–5.2)
ALT: 21 U/L (ref 0–35)
ANION GAP: 8 (ref 5–15)
AST: 36 U/L (ref 0–37)
Alkaline Phosphatase: 50 U/L (ref 39–117)
BILIRUBIN TOTAL: 1.5 mg/dL — AB (ref 0.3–1.2)
BUN: 18 mg/dL (ref 6–23)
CHLORIDE: 106 mmol/L (ref 96–112)
CO2: 24 mmol/L (ref 19–32)
CREATININE: 1.49 mg/dL — AB (ref 0.50–1.10)
Calcium: 8 mg/dL — ABNORMAL LOW (ref 8.4–10.5)
GFR calc Af Amer: 39 mL/min — ABNORMAL LOW (ref >90)
GFR, EST NON AFRICAN AMERICAN: 34 mL/min — AB (ref >90)
Glucose, Bld: 127 mg/dL — ABNORMAL HIGH (ref 70–99)
Potassium: 4.2 mmol/L (ref 3.5–5.1)
SODIUM: 138 mmol/L (ref 135–145)
Total Protein: 4.5 g/dL — ABNORMAL LOW (ref 6.0–8.3)

## 2014-04-17 LAB — HEMOGLOBIN AND HEMATOCRIT, BLOOD
HCT: 35 % — ABNORMAL LOW (ref 36.0–46.0)
Hemoglobin: 11.4 g/dL — ABNORMAL LOW (ref 12.0–15.0)

## 2014-04-17 MED FILL — Sodium Chloride IV Soln 0.9%: INTRAVENOUS | Qty: 2000 | Status: AC

## 2014-04-17 MED FILL — Heparin Sodium (Porcine) Inj 1000 Unit/ML: INTRAMUSCULAR | Qty: 30 | Status: AC

## 2014-04-17 NOTE — Evaluation (Signed)
Occupational Therapy Evaluation Patient Details Name: Christina Booker MRN: [id number] DOB: 1940/11/09 Today's Date: 04/17/2014    History of Present Illness Patient is a 74 y/o female s/p L4 Gill Procedure, L4-5 Decompression and Fusion, Cage, Pedicle Instrumentation POD 1. PMH of HTN, stroke.   Clinical Impression   PTA pt lived at home with her husband and was independent with ADLs. Pt currently requires min A for functional mobility and assist for LB ADLs due to back pain and back precautions limiting independence. Pt will benefit from acute OT for ADL training and functional mobility.     Follow Up Recommendations  No OT follow up;Supervision/Assistance - 24 hour    Equipment Recommendations  3 in 1 bedside comode    Recommendations for Other Services       Precautions / Restrictions Precautions Precautions: Fall;Back Precaution Booklet Issued: Yes (comment) Precaution Comments: Reviewed handout and precautions. Required Braces or Orthoses: Spinal Brace Spinal Brace: Lumbar corset Restrictions Weight Bearing Restrictions: No      Mobility Bed Mobility Overal bed mobility: Needs Assistance Bed Mobility: Rolling;Sidelying to Sit Rolling: Min guard Sidelying to sit: Min guard;HOB elevated       General bed mobility comments: Min guard and VC's for sequencing and technique.   Transfers Overall transfer level: Needs assistance Equipment used: Rolling walker (2 wheeled) Transfers: Sit to/from Stand Sit to Stand: Min assist         General transfer comment: Min A to stand from chair with cues for anterior translation and hand placement. Increased effort to stand.    Balance Overall balance assessment: Needs assistance Sitting-balance support: Feet supported;No upper extremity supported Sitting balance-Leahy Scale: Fair     Standing balance support: During functional activity Standing balance-Leahy Scale: Poor Standing balance comment: requires BUE support  on RW for balance/safety.                            ADL Overall ADL's : Needs assistance/impaired Eating/Feeding: Independent;Sitting   Grooming: Set up;Sitting   Upper Body Bathing: Set up;Sitting   Lower Body Bathing: Moderate assistance;Sit to/from stand   Upper Body Dressing : Sitting;Moderate assistance (including back brace)   Lower Body Dressing: Maximal assistance;Sit to/from stand Lower Body Dressing Details (indicate cue type and reason): max A due to back precautions Toilet Transfer: Minimal assistance;Ambulation;RW Toilet Transfer Details (indicate cue type and reason): bed>recliner         Functional mobility during ADLs: Min guard;Rolling walker General ADL Comments: Educated pt on back precautions and incorporating into ADLs. Pt will benefit from further compensatory and ADL training               Pertinent Vitals/Pain Pain Assessment: 0-10 Pain Score: 5  Pain Location: back, surgical Pain Descriptors / Indicators: Sore Pain Intervention(s): Monitored during session;Repositioned     Hand Dominance Right   Extremity/Trunk Assessment Upper Extremity Assessment Upper Extremity Assessment: Overall WFL for tasks assessed   Lower Extremity Assessment Lower Extremity Assessment: Generalized weakness   Cervical / Trunk Assessment Cervical / Trunk Assessment: Normal   Communication Communication Communication: No difficulties   Cognition Arousal/Alertness: Awake/alert Behavior During Therapy: WFL for tasks assessed/performed Overall Cognitive Status: Within Functional Limits for tasks assessed                                Home Living Family/patient expects to be discharged to:: Private  residence Living Arrangements: Spouse/significant other Available Help at Discharge: Family;Available 24 hours/day Type of Home: House Home Access: Stairs to enter [employer] of Steps: 4 Entrance Stairs-Rails: Right Home  Layout: One level     Bathroom Shower/Tub: Occupational psychologist: Standard     Home Equipment: Environmental consultant - 2 wheels;Grab bars - toilet          Prior Functioning/Environment Level of Independence: Independent             OT Diagnosis: Generalized weakness;Acute pain   OT Problem List: Decreased strength;Decreased range of motion;Decreased activity tolerance;Impaired balance (sitting and/or standing);Decreased knowledge of precautions;Decreased knowledge of use of DME or AE;Pain   OT Treatment/Interventions: Self-care/ADL training;Therapeutic exercise;Energy conservation;DME and/or AE instruction;Therapeutic activities;Patient/family education;Balance training    OT Goals(Current goals can be found in the care plan section) Acute Rehab OT Goals Patient Stated Goal: to return home OT Goal Formulation: With patient Time For Goal Achievement: 05/01/14 Potential to Achieve Goals: Good ADL Goals Pt Will Perform Grooming: with supervision;standing Pt Will Perform Lower Body Bathing: with set-up;with supervision;with adaptive equipment;sit to/from stand Pt Will Perform Lower Body Dressing: with set-up;with supervision;with adaptive equipment;sit to/from stand Pt Will Transfer to Toilet: with supervision;ambulating Pt Will Perform Toileting - Clothing Manipulation and hygiene: with supervision;sit to/from stand Pt Will Perform Tub/Shower Transfer: Shower transfer;with supervision;ambulating;3 in 1;rolling walker Additional ADL Goal #1: Pt will don/doff back brace independently to prepare for ADLs.   OT Frequency: Min 2X/week    End of Session Equipment Utilized During Treatment: Gait belt;Rolling walker;Back brace Nurse Communication: Mobility status  Activity Tolerance: Patient tolerated treatment well Patient left: in chair;with call bell/phone within reach   Time: 1627-1652 OT Time Calculation (min): 25 min Charges:  OT General Charges $OT Visit: 1 Procedure OT  Evaluation $Initial OT Evaluation Tier I: 1 Procedure OT Treatments $Self Care/Home Management : 8-22 mins G-Codes:    Juluis Rainier 04-24-2014, 5:35 PM   Cyndie Chime, OTR/L Occupational Therapist [phone] (pager)

## 2014-04-17 NOTE — Progress Notes (Signed)
Orthopedic Tech Progress Note Patient Details:  Christina Booker 1940-10-28 [id number] Spoke with patient's nurse who stated patient already had back brace. No action needed from Ortho Tech at this time. Patient ID: Christina Booker, female   DOB: 1941/01/31, 74 y.o.   MRN: [id number]   Fenton Foy 04/17/2014, 11:07 AM

## 2014-04-17 NOTE — Progress Notes (Signed)
Subjective: 1 Day Post-Op Procedure(s) (LRB): L4 Gill Procedure, L4-5 Decompression and Fusion, Cage, Pedicle Instrumentation (N/A) Patient reports pain as 2 on 0-10 scale.    Objective: Vital signs in last 24 hours: Temp:  [97.8 F (36.6 C)-99 F (37.2 C)] 99 F (37.2 C) (02/04 0512) Pulse Rate:  [68-89] 71 (02/04 0512) Resp:  [10-20] 16 (02/04 0512) BP: (93-175)/(50-95) 105/50 mmHg (02/04 0512) SpO2:  [96 %-100 %] 96 % (02/04 0512) Weight:  [78.382 kg (172 lb 12.8 oz)] 78.382 kg (172 lb 12.8 oz) (02/03 1023)  Intake/Output from previous day: 02/03 0701 - 02/04 0700 In: 2384.8 [P.O.:240; I.V.:1694.8; Blood:450] Out: 1625 [Urine:675; Blood:950] Intake/Output this shift:    No results for input(s): HGB in the last 72 hours. No results for input(s): WBC, RBC, HCT, PLT in the last 72 hours. No results for input(s): NA, K, CL, CO2, BUN, CREATININE, GLUCOSE, CALCIUM in the last 72 hours. No results for input(s): LABPT, INR in the last 72 hours.  Neurologically intact  Assessment/Plan: 1 Day Post-Op Procedure(s) (LRB): L4 Gill Procedure, L4-5 Decompression and Fusion, Cage, Pedicle Instrumentation (N/A) Up with therapy H and H ordered.   Banita Lehn C 04/17/2014, 7:54 AM

## 2014-04-17 NOTE — Evaluation (Signed)
Physical Therapy Evaluation Patient Details Name: Christina Booker MRN: [id number] DOB: 1940/04/24 Today's Date: 04/17/2014   History of Present Illness  Patient is a 74 y/o female s/p L4 Gill Procedure, L4-5 Decompression and Fusion, Cage, Pedicle Instrumentation POD 1. PMH of HTN, stroke.    Clinical Impression  Patient presents with pain, generalized weakness and balance deficits s/p above surgery. Education provided on back precautions and pt able to verbalize 3/3 from OT session. Pt tolerated ambulation with Min guard assist for safety. Pt would benefit from skilled PT to improve transfers, gait, balance and mobility so pt can maximize independence and return to PLOF.    Follow Up Recommendations Home health PT;Supervision/Assistance - 24 hour    Equipment Recommendations  None recommended by PT    Recommendations for Other Services       Precautions / Restrictions Precautions Precautions: Fall;Back Precaution Booklet Issued: Yes (comment) Precaution Comments: Reviewed handout and precautions. Required Braces or Orthoses: Spinal Brace Spinal Brace: Lumbar corset Restrictions Weight Bearing Restrictions: No      Mobility  Bed Mobility               General bed mobility comments: Received sitting in chair upon PT arrival.  Transfers Overall transfer level: Needs assistance Equipment used: Rolling walker (2 wheeled) Transfers: Sit to/from Stand Sit to Stand: Min assist         General transfer comment: Min A to stand from chair with cues for anterior translation and hand placement. Increased effort to stand.  Ambulation/Gait Ambulation/Gait assistance: Min guard Ambulation Distance (Feet): 75 Feet Assistive device: Rolling walker (2 wheeled) Gait Pattern/deviations: Step-to pattern;Decreased stride length;Decreased step length - right;Decreased step length - left;Narrow base of support   Gait velocity interpretation: Below normal speed for  age/gender General Gait Details: Pt with slow, unsteady gait. Cues for RW proximity and upright posture.   Stairs            Wheelchair Mobility    Modified Rankin (Stroke Patients Only)       Balance Overall balance assessment: Needs assistance Sitting-balance support: Feet supported;No upper extremity supported Sitting balance-Leahy Scale: Fair     Standing balance support: During functional activity Standing balance-Leahy Scale: Poor Standing balance comment: requires BUE support on RW for balance/safety.                             Pertinent Vitals/Pain Pain Assessment: 0-10 Pain Score: 5  Pain Location: back at surgical site Pain Descriptors / Indicators: Sore Pain Intervention(s): Monitored during session;Repositioned    Home Living Family/patient expects to be discharged to:: Private residence Living Arrangements: Spouse/significant other Available Help at Discharge: Family;Available 24 hours/day Type of Home: House Home Access: Stairs to enter Entrance Stairs-Rails: Right Entrance Stairs-Number of Steps: 4 Home Layout: One level Home Equipment: Walker - 2 wheels      Prior Function Level of Independence: Independent               Hand Dominance        Extremity/Trunk Assessment   Upper Extremity Assessment: Defer to OT evaluation           Lower Extremity Assessment: Generalized weakness         Communication   Communication: No difficulties  Cognition Arousal/Alertness: Awake/alert Behavior During Therapy: WFL for tasks assessed/performed Overall Cognitive Status: Within Functional Limits for tasks assessed  General Comments      Exercises        Assessment/Plan    PT Assessment Patient needs continued PT services  PT Diagnosis Generalized weakness;Acute pain;Difficulty walking   PT Problem List Decreased strength;Pain;Decreased activity tolerance;Decreased balance;Decreased  mobility;Decreased knowledge of precautions  PT Treatment Interventions Balance training;Gait training;Stair training;DME instruction;Therapeutic activities;Therapeutic exercise;Patient/family education;Functional mobility training   PT Goals (Current goals can be found in the Care Plan section) Acute Rehab PT Goals Patient Stated Goal: to return home PT Goal Formulation: With patient Time For Goal Achievement: 05/01/14 Potential to Achieve Goals: Fair    Frequency Min 5X/week   Barriers to discharge        Co-evaluation               End of Session Equipment Utilized During Treatment: Gait belt;Back brace Activity Tolerance: Patient tolerated treatment well Patient left: in chair;with call bell/phone within reach (chair alarm pad under patient but no alarm box. Pt instructed to press call bell for assist with mobility.) Nurse Communication: Mobility status         Time: 1735-6701 PT Time Calculation (min) (ACUTE ONLY): 18 min   Charges:   PT Evaluation $Initial PT Evaluation Tier I: 1 Procedure     PT G CodesCandy Sledge A 05-04-14, 5:29 PM Candy Sledge, PT, DPT [phone]

## 2014-04-17 NOTE — Op Note (Signed)
NAMEYASLENE, LINDAMOOD NO.:  [id number]  MEDICAL RECORD NO.:  [id number]  LOCATION:  [id number]                        FACILITY:  [facility]  PHYSICIAN:  Jametta Moorehead C. Lorin Mercy, M.D.    DATE OF BIRTH:  [birth year]  DATE OF PROCEDURE:  04/16/2014 DATE OF DISCHARGE:                              OPERATIVE REPORT   PREOPERATIVE DIAGNOSES:  Unstable spondylolisthesis with severe spinal stenosis, lateral recess and foraminal stenosis.  POSTOPERATIVE DIAGNOSES:  Unstable spondylolisthesis with severe spinal stenosis, lateral recess and foraminal stenosis.  PROCEDURE:  L4 Gill procedure, bilateral right and left L5 laminotomy superiorly.  Resection of left L4-5 facet joint.  A 10-mm carbon PEEK interbody spacer with local bone graft.  Bilateral gutter fluid fusion. Pedicle instrumentation DePuy.  A 10-mm lordotic cord curved cage.  6 x 45 mm screws, 45-mm rods, Matrix screws.  SURGEON:  Chesney Klimaszewski C. Lorin Mercy, MD  ASSISTANT:  Benjiman Core, PA-C medically necessary and present for the entire procedure.  ANESTHESIA:  General plus Marcaine local.  COMPLICATIONS:  None.  EBL:  700.  BRIEF HISTORY:  This is a 74 year old female who has had progressive stenosis, symptoms of neurogenic claudication, instability with rocking on flexion and extension views at the L4-5 level greater than 3 mm with severe lateral recess stenosis, foraminal stenosis more left than right and severe central stenosis multifactorial.  DESCRIPTION OF PROCEDURE:  After induction of general anesthesia, the patient was placed on the spine frame, prone, yellow pads underneath the shoulders and arms at 90/ 90.  SCDs were applied.  DuraPrep, Ancef prophylaxis.  Area squared with towels, Betadine, Steri-Drape after sterile skin marker, laminectomy sheets and drapes and time-out procedure.  Midline incision was made over the palpable 4-5 level subperiosteal dissection.  Once the lamina was exposed, the step-off was noted and  instability with abnormal motion, and Kocher clamp was placed at  the planned level for decompression.  The top of spinous process of L4 and in the mid portion of L5 confirmed with cross-table lateral C-arm that had been sterilely draped.  Dissection out to the transverse processes of L4 and L5 right and left, placement of the  Viper retractor.  The posterior element of L4 lamina spinous process was resected.  It was meticulously cleaned by the scrub nurse while a decompression was performed.  There was large chunks of hypertrophic ligamentum which removed.  Operative microscope was draped and brought in.  Overhanging spurs and spurs from the side caused hourglass constriction and these spurs were resected.  Obvious step-off of 4-5 abnormal motion was performed.  Facet  was resected on the left in line with the disk space.  There were multiple epidural veins which were coagulated with bipolar cautery.  Combination of some thrombin, Gelfoam, and Surgicel was used.  Resection of the disk was performed with angled curettes.  The top portion of L5 right and left laminotomy was performed.  Due to the patient's anterolisthesis to give the dura more room with L5 laminotomy on right and left.  Lateral recess was completely decompressed.  Right and left bone removed at the level of the pedicle and foraminotomies performed on the right to free up the nerve root with  resection of the facet joint on the left.  Cleaning up the disk space angled curettes, straight curettes, rasp, straight pituitaries, wide pituitaries, up and down biting pituitaries were performed.  The ring curettes were used as well.  Endplates were bleeding.  Trial sizers marking up to a 10 which restored the height to aid in reduction and correction of the spondylolisthesis with greater than 50% correction.  A 10-mm Concorde cage was packed with bone in the middle.  The patient's bone had been cleaned, was packed anterior to  the cage, and the cage was pushed and kicked over and it was checked under fluoroscopy and was almost exactly in the midline.  Cage was countersunk.  There was good reduction.  Screws were then placed using the prong pedicle Feeler, tapping pedicle Feeler again using a hockey stick medially in the canal to make sure there is no penetration to the inferior aspect of the pedicle as well as none through the medial side of the pedicle.  Fluoroscopy was used checking with the prong first, then once the joystick had been advanced, 45 mm were selected after tapping, checking with the C-arm again, and then placement of the screw, and then final confirmation pictures showing the good screw position. This was repeated four times.  Before the screw was finally inserted, lateral transverse process was decorticated from the lateral fusion for additional securement due to the patient's significant instability.  The 45-mm rods were inserted, compressed.  On the right side, rod was not sticking out through the bottom, and so the screw caps were loosened and the rod was slid down and then was sticking out on both sides and re- compressed.  The left side where the cage had been inserted was compressed first followed by the opposite side.  There was good compression, good reduction, and final x-rays were taken.  Good position of the rods.  Bone was meticulously packed in the decorticated lateral gutter onto the transverse processes.  Canal was checked and there was minimal bleeding.  The Gelfoam was removed.  All patties were removed, repeat irrigation.  Fascia was closed with #1 Vicryl, 2-0 Vicryl in subcutaneous tissue, subcuticular closure.  Dermabond and postop dressing.  Instrument count and needle count was correct.     Layali Freund C. Lorin Mercy, M.D.     MCY/MEDQ  D:  04/16/2014  T:  04/17/2014  Job:  [id number]

## 2014-04-18 LAB — COMPREHENSIVE METABOLIC PANEL
ALT: 18 U/L (ref 0–35)
AST: 28 U/L (ref 0–37)
Albumin: 2.6 g/dL — ABNORMAL LOW (ref 3.5–5.2)
Alkaline Phosphatase: 47 U/L (ref 39–117)
Anion gap: 4 — ABNORMAL LOW (ref 5–15)
BILIRUBIN TOTAL: 1.4 mg/dL — AB (ref 0.3–1.2)
BUN: 17 mg/dL (ref 6–23)
CO2: 24 mmol/L (ref 19–32)
Calcium: 8 mg/dL — ABNORMAL LOW (ref 8.4–10.5)
Chloride: 108 mmol/L (ref 96–112)
Creatinine, Ser: 1.11 mg/dL — ABNORMAL HIGH (ref 0.50–1.10)
GFR calc Af Amer: 56 mL/min — ABNORMAL LOW (ref >90)
GFR, EST NON AFRICAN AMERICAN: 48 mL/min — AB (ref >90)
Glucose, Bld: 115 mg/dL — ABNORMAL HIGH (ref 70–99)
Potassium: 3.9 mmol/L (ref 3.5–5.1)
Sodium: 136 mmol/L (ref 135–145)
Total Protein: 4.4 g/dL — ABNORMAL LOW (ref 6.0–8.3)

## 2014-04-18 MED ORDER — OXYCODONE-ACETAMINOPHEN 10-325 MG PO TABS: 1.0000 | ORAL_TABLET | ORAL | Status: DC | PRN

## 2014-04-18 MED ORDER — ONDANSETRON 4 MG PO TBDP: 4.0000 mg | ORAL_TABLET | Freq: Three times a day (TID) | ORAL | Status: DC | PRN

## 2014-04-18 MED ORDER — METHOCARBAMOL 500 MG PO TABS: 500.0000 mg | ORAL_TABLET | Freq: Four times a day (QID) | ORAL | Status: DC | PRN

## 2014-04-18 NOTE — Progress Notes (Signed)
Utilization review completed. Latana Colin, RN, BSN. 

## 2014-04-18 NOTE — Progress Notes (Signed)
Subjective: Doing well.  Pain controlled.   Objective: Vital signs in last 24 hours: Temp:  [98.6 F (37 C)-98.9 F (37.2 C)] 98.7 F (37.1 C) (02/05 0508) Pulse Rate:  [69-78] 77 (02/05 0508) Resp:  [16-17] 16 (02/05 0508) BP: (83-93)/(40-53) 93/53 mmHg (02/05 0508) SpO2:  [90 %-95 %] 92 % (02/05 0621)  Intake/Output from previous day: 02/04 0701 - 02/05 0700 In: 960 [P.O.:960] Out: 300 [Urine:300] Intake/Output this shift:     Recent Labs  04/17/14 0908  HGB 11.4*    Recent Labs  04/17/14 0908  HCT 35.0*    Recent Labs  04/17/14 1026 04/18/14 0454  NA 138 136  K 4.2 3.9  CL 106 108  CO2 24 24  BUN 18 17  CREATININE 1.49* 1.11*  GLUCOSE 127* 115*  CALCIUM 8.0* 8.0*   No results for input(s): LABPT, INR in the last 72 hours.  Exam:  Wound looks good.  No drainage or signs of infection.  bilat calves nontender.  NVI.    Assessment/Plan: Will plan for discharge home tomorrow.  Discussed with patient and family members present.  Continue PT.  Scripts on chart.    Stacey Sago M 04/18/2014, 1:09 PM

## 2014-04-18 NOTE — Progress Notes (Signed)
Physical Therapy Treatment Patient Details Name: ANNAYAH WORTHLEY MRN: [id number] DOB: 01-07-1941 Today's Date: 04/18/2014    History of Present Illness Patient is a 74 y/o female s/p L4 Gill Procedure, L4-5 Decompression and Fusion, Cage, Pedicle Instrumentation POD 1. PMH of HTN, stroke.    PT Comments    Pt. Would benefit from transfer training to increase independence in mobility. Was upset by not being helped getting out of the chair before session, but her mood was fine during session. Took instruction well and is appropriate for d/c to home health.   Follow Up Recommendations  Home health PT;Supervision/Assistance - 24 hour     Equipment Recommendations  None recommended by PT    Recommendations for Other Services       Precautions / Restrictions Precautions Precautions: Fall;Back Precaution Booklet Issued: Yes (comment) Required Braces or Orthoses: Spinal Brace Spinal Brace: Lumbar corset Restrictions Weight Bearing Restrictions: No    Mobility  Bed Mobility Overal bed mobility: Needs Assistance Bed Mobility: Sit to Supine;Rolling Rolling: Min guard     Sit to supine: Min guard   General bed mobility comments: Min guard and VC's for sequencing and technique.   Transfers Overall transfer level: Needs assistance Equipment used: Rolling walker (2 wheeled) Transfers: Sit to/from Stand Sit to Stand: Min assist         General transfer comment: Min A to stand from chair with cues for anterior translation and hand placement. Increased effort to stand. prefers one hand on and one hand on surface  Ambulation/Gait Ambulation/Gait assistance: Supervision Ambulation Distance (Feet): 100 Feet Assistive device: Rolling walker (2 wheeled) Gait Pattern/deviations: Step-through pattern;Step-to pattern;Decreased stride length   Gait velocity interpretation: Below normal speed for age/gender General Gait Details: Vc's and demonstration of step through gait to increase  stride length, cues for amount of pressure on RW and upright posture.    Stairs            Wheelchair Mobility    Modified Rankin (Stroke Patients Only)       Balance Overall balance assessment: Needs assistance   Sitting balance-Leahy Scale: Fair       Standing balance-Leahy Scale: Poor Standing balance comment: requires BUE support on RW for balance/safety                    Cognition   Behavior During Therapy: [facility] for tasks assessed/performed Overall Cognitive Status: Within Functional Limits for tasks assessed                      Exercises      General Comments        Pertinent Vitals/Pain Pain Score: 2  Pain Location: back Pain Descriptors / Indicators: Sore Pain Intervention(s): Monitored during session;Repositioned    Home Living                      Prior Function            PT Goals (current goals can now be found in the care plan section) Progress towards PT goals: Progressing toward goals    Frequency  Min 5X/week    PT Plan Current plan remains appropriate    Co-evaluation             End of Session Equipment Utilized During Treatment: Back brace Activity Tolerance: Patient tolerated treatment well Patient left: in bed;with call bell/phone within reach;with bed alarm set     Time: 7322-0254 PT  Time Calculation (min) (ACUTE ONLY): 28 min  Charges:                       G Codes:      Jodi Geralds, SPTA 04/18/2014, 10:47 AM

## 2014-04-18 NOTE — Progress Notes (Signed)
Was called into patients room by NT because pt was feeling SOB. Pt was frustrated because "she had called out to get back to bed and it took longer than expected". Pt was put on 2L O2 and O2 saturation was 97%. Pt states that she had "gotten worked up". Pt back to bed and resting. Will continue to monitor.

## 2014-04-19 LAB — COMPREHENSIVE METABOLIC PANEL
ALBUMIN: 2.6 g/dL — AB (ref 3.5–5.2)
ALK PHOS: 49 U/L (ref 39–117)
ALT: 17 U/L (ref 0–35)
AST: 24 U/L (ref 0–37)
Anion gap: 7 (ref 5–15)
BILIRUBIN TOTAL: 1.3 mg/dL — AB (ref 0.3–1.2)
BUN: 9 mg/dL (ref 6–23)
CO2: 24 mmol/L (ref 19–32)
Calcium: 8.1 mg/dL — ABNORMAL LOW (ref 8.4–10.5)
Chloride: 105 mmol/L (ref 96–112)
Creatinine, Ser: 0.81 mg/dL (ref 0.50–1.10)
GFR calc Af Amer: 82 mL/min — ABNORMAL LOW (ref >90)
GFR, EST NON AFRICAN AMERICAN: 70 mL/min — AB (ref >90)
Glucose, Bld: 121 mg/dL — ABNORMAL HIGH (ref 70–99)
Potassium: 4.1 mmol/L (ref 3.5–5.1)
SODIUM: 136 mmol/L (ref 135–145)
TOTAL PROTEIN: 5 g/dL — AB (ref 6.0–8.3)

## 2014-04-19 NOTE — Progress Notes (Signed)
Physical Therapy Treatment Patient Details Name: KESHONA KARTES MRN: [id number] DOB: 12-17-1940 Today's Date: 04/19/2014    History of Present Illness      PT Comments    Stair training complete.  Pt pending d/c home today.  Follow Up Recommendations  Home health PT;Supervision/Assistance - 24 hour     Equipment Recommendations  None recommended by PT    Recommendations for Other Services       Precautions / Restrictions Precautions Precautions: Back;Fall Precaution Comments: Reviewed handout and precautions. Required Braces or Orthoses: Spinal Brace Spinal Brace: Lumbar corset Restrictions Weight Bearing Restrictions: No    Mobility  Bed Mobility                  Transfers   Equipment used: Rolling walker (2 wheeled) Transfers: Sit to/from [employer] Transfers Sit to Stand: Supervision Stand pivot transfers: Supervision       General transfer comment: verbal cues for safety  Ambulation/Gait Ambulation/Gait assistance: Supervision Ambulation Distance (Feet): 150 Feet Assistive device: Rolling walker (2 wheeled) Gait Pattern/deviations: Step-through pattern;Decreased stride length Gait velocity: decreased       Stairs Stairs: Yes Stairs assistance: Min guard Stair Management: Two rails;Forwards;Step to pattern Number of Stairs: 5 (x 2)    Wheelchair Mobility    Modified Rankin (Stroke Patients Only)       Balance                                    Cognition Arousal/Alertness: Awake/alert Behavior During Therapy: WFL for tasks assessed/performed Overall Cognitive Status: Within Functional Limits for tasks assessed                      Exercises      General Comments        Pertinent Vitals/Pain Pain Assessment: No/denies pain    Home Living                      Prior Function            PT Goals (current goals can now be found in the care plan section) Progress towards PT  goals: Progressing toward goals    Frequency  Min 5X/week    PT Plan Current plan remains appropriate    Co-evaluation             End of Session Equipment Utilized During Treatment: Gait belt;Back brace Activity Tolerance: Patient tolerated treatment well Patient left: in chair;with call bell/phone within reach;with family/visitor present     Time: 6314-9702 PT Time Calculation (min) (ACUTE ONLY): 16 min  Charges:  $Gait Training: 8-22 mins                    G Codes:      Lorriane Shire 04/19/2014, 11:25 AM

## 2014-05-06 NOTE — Discharge Summary (Signed)
Patient ID: MAZY CULTON MRN: [id number] DOB/AGE: [birth year] 74 y.o.  Admit date: 04/16/2014 Discharge date: 05/06/2014  Admission Diagnoses:  Active Problems:   S/P lumbar fusion   Discharge Diagnoses:  Active Problems:   S/P lumbar fusion  status post Procedure(s): L4 Gill Procedure, L4-5 Decompression and Fusion, Cage, Pedicle Instrumentation  Past Medical History  Diagnosis Date  . Hypertension   . Stroke 12/01/2009    tia  . Arthritis   . Irritable bowel syndrome   . Rosacea     Surgeries: Procedure(s): L4 Gill Procedure, L4-5 Decompression and Fusion, Cage, Pedicle Instrumentation on 04/16/2014   Consultants:    Discharged Condition: Improved  Hospital Course: Christina Booker is an 74 y.o. female who was admitted 04/16/2014 for operative treatment of lumbar stenosis. Patient failed conservative treatments (please see the history and physical for the specifics) and had severe unremitting pain that affects sleep, daily activities and work/hobbies. After pre-op clearance, the patient was taken to the operating room on 04/16/2014 and underwent  Procedure(s): L4 Gill Procedure, L4-5 Decompression and Fusion, Cage, Pedicle Instrumentation.    Patient was given perioperative antibiotics:  Anti-infectives    Start     Dose/Rate Route Frequency Ordered Stop   04/16/14 0600  ceFAZolin (ANCEF) IVPB 2 g/50 mL premix     2 g 100 mL/hr over 30 Minutes Intravenous On call to O.R. 04/15/14 1242 04/16/14 1304       Patient was given sequential compression devices and early ambulation to prevent DVT.   Patient benefited maximally from hospital stay and there were no complications. At the time of discharge, the patient was urinating/moving their bowels without difficulty, tolerating a regular diet, pain is controlled with oral pain medications and they have been cleared by PT/OT.   Recent vital signs: No data found.    Recent laboratory studies: No results for input(s):  WBC, HGB, HCT, PLT, NA, K, CL, CO2, BUN, CREATININE, GLUCOSE, INR, CALCIUM in the last 72 hours.  Invalid input(s): PT, 2   Discharge Medications:     Medication List    STOP taking these medications        aspirin EC 325 MG tablet     zolpidem 10 MG tablet  Commonly known as:  AMBIEN      TAKE these medications        atorvastatin 20 MG tablet  Commonly known as:  LIPITOR  Take 20 mg by mouth every evening.     BENEFIBER PO  Take by mouth every morning. 1 tablespoon     buPROPion 150 MG 24 hr tablet  Commonly known as:  WELLBUTRIN XL  Take 150 mg by mouth every morning.     Cholecalciferol 1000 UNITS capsule  Take 1,000 Units by mouth 2 (two) times daily.     dicyclomine 10 MG capsule  Commonly known as:  BENTYL  Take 10 mg by mouth 2 (two) times daily as needed for spasms.     glycerin adult 2 G Supp  Place 1 suppository rectally 3 (three) times daily as needed (constipation).     lisinopril 5 MG tablet  Commonly known as:  PRINIVIL,ZESTRIL  Take 5 mg by mouth every evening.     methocarbamol 500 MG tablet  Commonly known as:  ROBAXIN  Take 1 tablet (500 mg total) by mouth every 6 (six) hours as needed for muscle spasms.     ondansetron 4 MG disintegrating tablet  Commonly known as:  ZOFRAN ODT  Take 1 tablet (4 mg total) by mouth every 8 (eight) hours as needed.     oxyCODONE-acetaminophen 10-325 MG per tablet  Commonly known as:  PERCOCET  Take 1 tablet by mouth every 4 (four) hours as needed.     polyethylene glycol powder powder  Commonly known as:  GLYCOLAX/MIRALAX  Take by mouth every evening. 1 tablespoon        Diagnostic Studies: Dg Chest 2 View  04/10/2014   CLINICAL DATA:  Preoperative examination prior to a procedure for spinal stenosis  EXAM: CHEST  2 VIEW  COMPARISON:  Portable chest x-ray of December 07, 2009.  FINDINGS: The lungs are adequately inflated. There is no focal infiltrate. There is no pleural effusion. The heart and  pulmonary vascularity are normal. There is tortuosity of the descending thoracic aorta. The observed bony thorax is unremarkable.  IMPRESSION: There is no active cardiopulmonary disease.   Electronically Signed   By: David  Martinique   On: 04/10/2014 08:26   Dg Lumbar Spine 2-3 Views  04/16/2014   CLINICAL DATA:  Spinal stenosis.  Spondylolisthesis.  EXAM: LUMBAR SPINE - 2-3 VIEW; DG C-ARM 61-120 MIN  COMPARISON:  MRI dated 03/13/2014  FINDINGS: AP and lateral C-arm images demonstrate the patient has undergone interbody and posterior fusion at L4-5. Hardware appears in good position. Grade 1 spondylolisthesis is noted.  IMPRESSION: Fusion performed at L4-5.   Electronically Signed   By: Rozetta Nunnery M.D.   On: 04/16/2014 16:30   Dg C-arm 1-60 Min  04/16/2014   CLINICAL DATA:  Spinal stenosis.  Spondylolisthesis.  EXAM: LUMBAR SPINE - 2-3 VIEW; DG C-ARM 61-120 MIN  COMPARISON:  MRI dated 03/13/2014  FINDINGS: AP and lateral C-arm images demonstrate the patient has undergone interbody and posterior fusion at L4-5. Hardware appears in good position. Grade 1 spondylolisthesis is noted.  IMPRESSION: Fusion performed at L4-5.   Electronically Signed   By: Rozetta Nunnery M.D.   On: 04/16/2014 16:30        Discharge Instructions    Call MD / Call 911    Complete by:  As directed   If you experience chest pain or shortness of breath, CALL 911 and be transported to the hospital emergency room.  If you develope a fever above 101 F, pus (white drainage) or increased drainage or redness at the wound, or calf pain, call your surgeon's office.     Call MD / Call 911    Complete by:  As directed   If you experience chest pain or shortness of breath, CALL 911 and be transported to the hospital emergency room.  If you develope a fever above 101 F, pus (white drainage) or increased drainage or redness at the wound, or calf pain, call your surgeon's office.     Constipation Prevention    Complete by:  As directed   Drink  plenty of fluids.  Prune juice may be helpful.  You may use a stool softener, such as Colace (over the counter) 100 mg twice a day.  Use MiraLax (over the counter) for constipation as needed.     Constipation Prevention    Complete by:  As directed   Drink plenty of fluids.  Prune juice may be helpful.  You may use a stool softener, such as Colace (over the counter) 100 mg twice a day.  Use MiraLax (over the counter) for constipation as needed.     Diet - low sodium heart healthy    Complete  by:  As directed      Diet - low sodium heart healthy    Complete by:  As directed      Discharge instructions    Complete by:  As directed   Must wear LSO brace at all times.  Do not apply any creams or ointments to incision.  No bending, lifting, squatting, twisting or strenuous activity.     Driving restrictions    Complete by:  As directed   No driving until further notice.     Increase activity slowly as tolerated    Complete by:  As directed      Increase activity slowly as tolerated    Complete by:  As directed      Lifting restrictions    Complete by:  As directed   No lifting until further notice.           Follow-up Information    Follow up with [facility].   Why:  They will contact you to schedule home therapy visits.   Contact information:   [address] [place] [address] [phone]       Follow up with Marybelle Killings, MD In 1 week.   Specialty:  Orthopedic Surgery   Contact information:   [address] [place] [address] [phone]       Discharge Plan:  discharge to home  Disposition:     Signed: Lanae Crumbly 05/06/2014, 3:22 PM

## 2014-06-03 ENCOUNTER — Other Ambulatory Visit: Payer: Self-pay

## 2014-06-03 DIAGNOSIS — Z1231 Encounter for screening mammogram for malignant neoplasm of breast: Secondary | ICD-10-CM

## 2014-06-16 ENCOUNTER — Ambulatory Visit: Payer: PPO

## 2014-07-03 ENCOUNTER — Other Ambulatory Visit: Payer: Self-pay

## 2014-07-03 DIAGNOSIS — Z1231 Encounter for screening mammogram for malignant neoplasm of breast: Secondary | ICD-10-CM

## 2014-07-29 ENCOUNTER — Ambulatory Visit
Admission: RE | Admit: 2014-07-29 | Discharge: 2014-07-29 | Disposition: A | Discharge status: Home / Self Care | Payer: PPO | Source: Ambulatory Visit

## 2014-07-29 DIAGNOSIS — Z1231 Encounter for screening mammogram for malignant neoplasm of breast: Secondary | ICD-10-CM

## 2014-12-01 ENCOUNTER — Other Ambulatory Visit: Payer: Self-pay

## 2014-12-01 ENCOUNTER — Ambulatory Visit
Admission: RE | Admit: 2014-12-01 | Discharge: 2014-12-01 | Disposition: A | Discharge status: Home / Self Care | Payer: PPO | Source: Ambulatory Visit | Attending: General Surgery | Admitting: General Surgery

## 2014-12-01 DIAGNOSIS — R109 Unspecified abdominal pain: Secondary | ICD-10-CM

## 2014-12-01 DIAGNOSIS — R1030 Lower abdominal pain, unspecified: Secondary | ICD-10-CM

## 2014-12-16 ENCOUNTER — Other Ambulatory Visit (HOSPITAL_BASED_OUTPATIENT_CLINIC_OR_DEPARTMENT_OTHER): Payer: Self-pay | Admitting: Orthopaedic Surgery

## 2014-12-16 ENCOUNTER — Other Ambulatory Visit (HOSPITAL_COMMUNITY): Payer: Self-pay | Admitting: Orthopaedic Surgery

## 2014-12-16 ENCOUNTER — Encounter (HOSPITAL_BASED_OUTPATIENT_CLINIC_OR_DEPARTMENT_OTHER): Payer: Self-pay | Admitting: *Deleted

## 2014-12-17 ENCOUNTER — Other Ambulatory Visit (HOSPITAL_COMMUNITY): Payer: Self-pay | Admitting: Orthopaedic Surgery

## 2014-12-17 ENCOUNTER — Encounter (HOSPITAL_BASED_OUTPATIENT_CLINIC_OR_DEPARTMENT_OTHER)
Admission: RE | Admit: 2014-12-17 | Discharge: 2014-12-17 | Disposition: A | Discharge status: Home / Self Care | Payer: PPO | Source: Ambulatory Visit | Attending: Orthopaedic Surgery | Admitting: Orthopaedic Surgery

## 2014-12-17 DIAGNOSIS — Z6833 Body mass index (BMI) 33.0-33.9, adult: Secondary | ICD-10-CM | POA: Diagnosis not present

## 2014-12-17 DIAGNOSIS — Z79899 Other long term (current) drug therapy: Secondary | ICD-10-CM | POA: Diagnosis not present

## 2014-12-17 DIAGNOSIS — K589 Irritable bowel syndrome without diarrhea: Secondary | ICD-10-CM | POA: Diagnosis not present

## 2014-12-17 DIAGNOSIS — I10 Essential (primary) hypertension: Secondary | ICD-10-CM | POA: Diagnosis not present

## 2014-12-17 DIAGNOSIS — Z7982 Long term (current) use of aspirin: Secondary | ICD-10-CM | POA: Diagnosis not present

## 2014-12-17 DIAGNOSIS — M67442 Ganglion, left hand: Secondary | ICD-10-CM | POA: Diagnosis present

## 2014-12-17 DIAGNOSIS — Z01818 Encounter for other preprocedural examination: Secondary | ICD-10-CM | POA: Insufficient documentation

## 2014-12-17 DIAGNOSIS — M1990 Unspecified osteoarthritis, unspecified site: Secondary | ICD-10-CM | POA: Diagnosis not present

## 2014-12-17 DIAGNOSIS — Z87891 Personal history of nicotine dependence: Secondary | ICD-10-CM | POA: Diagnosis not present

## 2014-12-17 DIAGNOSIS — Z8673 Personal history of transient ischemic attack (TIA), and cerebral infarction without residual deficits: Secondary | ICD-10-CM | POA: Diagnosis not present

## 2014-12-18 LAB — BASIC METABOLIC PANEL
Anion gap: 6 (ref 5–15)
BUN: 11 mg/dL (ref 6–20)
CALCIUM: 9.1 mg/dL (ref 8.9–10.3)
CO2: 26 mmol/L (ref 22–32)
CREATININE: 0.94 mg/dL (ref 0.44–1.00)
Chloride: 107 mmol/L (ref 101–111)
GFR calc Af Amer: >60 mL/min (ref >60)
GFR, EST NON AFRICAN AMERICAN: 58 mL/min — AB (ref >60)
GLUCOSE: 90 mg/dL (ref 65–99)
POTASSIUM: 4.7 mmol/L (ref 3.5–5.1)
SODIUM: 139 mmol/L (ref 135–145)

## 2014-12-18 LAB — CBC
HCT: 44.4 % (ref 36.0–46.0)
Hemoglobin: 14.4 g/dL (ref 12.0–15.0)
MCH: 29.9 pg (ref 26.0–34.0)
MCHC: 32.4 g/dL (ref 30.0–36.0)
MCV: 92.1 fL (ref 78.0–100.0)
PLATELETS: 172 10*3/uL (ref 150–400)
RBC: 4.82 MIL/uL (ref 3.87–5.11)
RDW: 13.4 % (ref 11.5–15.5)
WBC: 6.3 10*3/uL (ref 4.0–10.5)

## 2014-12-22 ENCOUNTER — Encounter (HOSPITAL_BASED_OUTPATIENT_CLINIC_OR_DEPARTMENT_OTHER)
Admission: RE | Disposition: A | Discharge status: Home / Self Care | Payer: Self-pay | Source: Ambulatory Visit | Attending: Orthopaedic Surgery

## 2014-12-22 ENCOUNTER — Ambulatory Visit (HOSPITAL_BASED_OUTPATIENT_CLINIC_OR_DEPARTMENT_OTHER)
Admission: RE | Admit: 2014-12-22 | Discharge: 2014-12-22 | Disposition: A | Discharge status: Home / Self Care | Payer: PPO | Source: Ambulatory Visit | Attending: Orthopaedic Surgery | Admitting: Orthopaedic Surgery

## 2014-12-22 ENCOUNTER — Ambulatory Visit (HOSPITAL_BASED_OUTPATIENT_CLINIC_OR_DEPARTMENT_OTHER): Payer: PPO | Admitting: Anesthesiology

## 2014-12-22 ENCOUNTER — Encounter (HOSPITAL_BASED_OUTPATIENT_CLINIC_OR_DEPARTMENT_OTHER): Payer: Self-pay | Admitting: *Deleted

## 2014-12-22 DIAGNOSIS — Z7982 Long term (current) use of aspirin: Secondary | ICD-10-CM | POA: Insufficient documentation

## 2014-12-22 DIAGNOSIS — Z8673 Personal history of transient ischemic attack (TIA), and cerebral infarction without residual deficits: Secondary | ICD-10-CM | POA: Diagnosis not present

## 2014-12-22 DIAGNOSIS — Z79899 Other long term (current) drug therapy: Secondary | ICD-10-CM | POA: Insufficient documentation

## 2014-12-22 DIAGNOSIS — Z6833 Body mass index (BMI) 33.0-33.9, adult: Secondary | ICD-10-CM | POA: Insufficient documentation

## 2014-12-22 DIAGNOSIS — M67442 Ganglion, left hand: Secondary | ICD-10-CM | POA: Insufficient documentation

## 2014-12-22 DIAGNOSIS — K589 Irritable bowel syndrome without diarrhea: Secondary | ICD-10-CM | POA: Insufficient documentation

## 2014-12-22 DIAGNOSIS — Z87891 Personal history of nicotine dependence: Secondary | ICD-10-CM | POA: Insufficient documentation

## 2014-12-22 DIAGNOSIS — I10 Essential (primary) hypertension: Secondary | ICD-10-CM | POA: Insufficient documentation

## 2014-12-22 DIAGNOSIS — M1990 Unspecified osteoarthritis, unspecified site: Secondary | ICD-10-CM | POA: Insufficient documentation

## 2014-12-22 HISTORY — PX: GANGLION CYST EXCISION: SHX1691

## 2014-12-22 SURGERY — EXCISION, GANGLION CYST, WRIST
Anesthesia: Regional | Site: Hand | Laterality: Left

## 2014-12-22 MED ORDER — MIDAZOLAM HCL 2 MG/2ML IJ SOLN
1.0000 mg | INTRAMUSCULAR | Status: DC | PRN
Start: 2014-12-22 — End: 2014-12-22

## 2014-12-22 MED ORDER — PHENYLEPHRINE 40 MCG/ML (10ML) SYRINGE FOR IV PUSH (FOR BLOOD PRESSURE SUPPORT)
PREFILLED_SYRINGE | INTRAVENOUS | Status: AC
  Filled 2014-12-22: qty 10

## 2014-12-22 MED ORDER — ONDANSETRON HCL 4 MG/2ML IJ SOLN
INTRAMUSCULAR | Status: DC | PRN
  Administered 2014-12-22: 4 mg via INTRAVENOUS

## 2014-12-22 MED ORDER — ONDANSETRON HCL 4 MG/2ML IJ SOLN
INTRAMUSCULAR | Status: AC
  Filled 2014-12-22: qty 2

## 2014-12-22 MED ORDER — CEFAZOLIN SODIUM-DEXTROSE 2-3 GM-% IV SOLR
INTRAVENOUS | Status: AC
  Filled 2014-12-22: qty 50

## 2014-12-22 MED ORDER — CEFAZOLIN SODIUM-DEXTROSE 2-3 GM-% IV SOLR
INTRAVENOUS | Status: DC | PRN
  Administered 2014-12-22: 2 g via INTRAVENOUS

## 2014-12-22 MED ORDER — FENTANYL CITRATE (PF) 100 MCG/2ML IJ SOLN
INTRAMUSCULAR | Status: AC
  Filled 2014-12-22: qty 4

## 2014-12-22 MED ORDER — FENTANYL CITRATE (PF) 100 MCG/2ML IJ SOLN
50.0000 ug | INTRAMUSCULAR | Status: DC | PRN
  Administered 2014-12-22: 50 ug via INTRAVENOUS

## 2014-12-22 MED ORDER — PROMETHAZINE HCL 25 MG/ML IJ SOLN: 6.2500 mg | INTRAMUSCULAR | Status: DC | PRN

## 2014-12-22 MED ORDER — PROPOFOL 10 MG/ML IV BOLUS
INTRAVENOUS | Status: AC
  Filled 2014-12-22: qty 20

## 2014-12-22 MED ORDER — DEXAMETHASONE SODIUM PHOSPHATE 10 MG/ML IJ SOLN
INTRAMUSCULAR | Status: AC
  Filled 2014-12-22: qty 1

## 2014-12-22 MED ORDER — DEXAMETHASONE SODIUM PHOSPHATE 4 MG/ML IJ SOLN
INTRAMUSCULAR | Status: DC | PRN
  Administered 2014-12-22: 10 mg via INTRAVENOUS

## 2014-12-22 MED ORDER — LIDOCAINE HCL (CARDIAC) 20 MG/ML IV SOLN
INTRAVENOUS | Status: AC
  Filled 2014-12-22: qty 5

## 2014-12-22 MED ORDER — CHLORHEXIDINE GLUCONATE 4 % EX LIQD: 60.0000 mL | Freq: Once | CUTANEOUS | Status: DC

## 2014-12-22 MED ORDER — BUPIVACAINE HCL (PF) 0.5 % IJ SOLN
INTRAMUSCULAR | Status: AC
  Filled 2014-12-22: qty 30

## 2014-12-22 MED ORDER — SCOPOLAMINE 1 MG/3DAYS TD PT72: 1.0000 | MEDICATED_PATCH | Freq: Once | TRANSDERMAL | Status: DC | PRN

## 2014-12-22 MED ORDER — GLYCOPYRROLATE 0.2 MG/ML IJ SOLN
0.2000 mg | Freq: Once | INTRAMUSCULAR | Status: AC | PRN
  Administered 2014-12-22: 0.2 mg via INTRAVENOUS

## 2014-12-22 MED ORDER — LIDOCAINE HCL (CARDIAC) 20 MG/ML IV SOLN
INTRAVENOUS | Status: DC | PRN
  Administered 2014-12-22: 60 mg via INTRAVENOUS

## 2014-12-22 MED ORDER — HYDROCODONE-ACETAMINOPHEN 5-325 MG PO TABS: 2.0000 | ORAL_TABLET | Freq: Four times a day (QID) | ORAL | Status: DC | PRN

## 2014-12-22 MED ORDER — LACTATED RINGERS IV SOLN
INTRAVENOUS | Status: DC
  Administered 2014-12-22: 10:00:00 via INTRAVENOUS

## 2014-12-22 MED ORDER — MEPERIDINE HCL 25 MG/ML IJ SOLN: 6.2500 mg | INTRAMUSCULAR | Status: DC | PRN

## 2014-12-22 MED ORDER — GLYCOPYRROLATE 0.2 MG/ML IJ SOLN
INTRAMUSCULAR | Status: AC
  Filled 2014-12-22: qty 1

## 2014-12-22 MED ORDER — PROPOFOL 10 MG/ML IV BOLUS
INTRAVENOUS | Status: DC | PRN
  Administered 2014-12-22: 150 mg via INTRAVENOUS
  Administered 2014-12-22: 50 mg via INTRAVENOUS

## 2014-12-22 MED ORDER — HYDROMORPHONE HCL 1 MG/ML IJ SOLN: 0.2500 mg | INTRAMUSCULAR | Status: DC | PRN

## 2014-12-22 MED ORDER — BUPIVACAINE HCL (PF) 0.5 % IJ SOLN
INTRAMUSCULAR | Status: DC | PRN
  Administered 2014-12-22: 4 mL

## 2014-12-22 SURGICAL SUPPLY — 37 items
BANDAGE ELASTIC 4 VELCRO ST LF (GAUZE/BANDAGES/DRESSINGS) ×3 IMPLANT
BLADE SURG 15 STRL LF DISP TIS (BLADE) ×1 IMPLANT
BLADE SURG 15 STRL SS (BLADE) ×2
BNDG CONFORM 3 STRL LF (GAUZE/BANDAGES/DRESSINGS) ×3 IMPLANT
BNDG ESMARK 4X9 LF (GAUZE/BANDAGES/DRESSINGS) ×3 IMPLANT
BRUSH SCRUB EZ PLAIN DRY (MISCELLANEOUS) ×3 IMPLANT
CORDS BIPOLAR (ELECTRODE) ×3 IMPLANT
COVER BACK TABLE 60X90IN (DRAPES) ×3 IMPLANT
COVER MAYO STAND STRL (DRAPES) ×3 IMPLANT
DRAPE EXTREMITY T 121X128X90 (DRAPE) ×3 IMPLANT
GAUZE SPONGE 4X4 12PLY STRL (GAUZE/BANDAGES/DRESSINGS) ×3 IMPLANT
GAUZE XEROFORM 1X8 LF (GAUZE/BANDAGES/DRESSINGS) ×3 IMPLANT
GLOVE BIOGEL PI IND STRL 7.0 (GLOVE) ×2 IMPLANT
GLOVE BIOGEL PI IND STRL 7.5 (GLOVE) ×1 IMPLANT
GLOVE BIOGEL PI INDICATOR 7.0 (GLOVE) ×4
GLOVE BIOGEL PI INDICATOR 7.5 (GLOVE) ×2
GLOVE ECLIPSE 6.5 STRL STRAW (GLOVE) ×3 IMPLANT
GLOVE ORTHO TXT STRL SZ7.5 (GLOVE) ×3 IMPLANT
GOWN STRL REUS W/ TWL LRG LVL3 (GOWN DISPOSABLE) ×2 IMPLANT
GOWN STRL REUS W/TWL LRG LVL3 (GOWN DISPOSABLE) ×4
NEEDLE HYPO 25X1 1.5 SAFETY (NEEDLE) ×3 IMPLANT
NS IRRIG 1000ML POUR BTL (IV SOLUTION) ×3 IMPLANT
PACK BASIN DAY SURGERY FS (CUSTOM PROCEDURE TRAY) ×3 IMPLANT
PAD CAST 3X4 CTTN HI CHSV (CAST SUPPLIES) ×1 IMPLANT
PADDING CAST ABS 4INX4YD NS (CAST SUPPLIES) ×2
PADDING CAST ABS COTTON 4X4 ST (CAST SUPPLIES) ×1 IMPLANT
PADDING CAST COTTON 3X4 STRL (CAST SUPPLIES) ×2
SPLINT PLASTER CAST XFAST 3X15 (CAST SUPPLIES) IMPLANT
SPLINT PLASTER XTRA FASTSET 3X (CAST SUPPLIES)
STOCKINETTE 4X48 STRL (DRAPES) ×3 IMPLANT
SUT ETHILON 4 0 PS 2 18 (SUTURE) ×6 IMPLANT
SUT ETHILON 5 0 PS 2 18 (SUTURE) ×3 IMPLANT
SUT ETHILON 6 0 P 1 (SUTURE) ×3 IMPLANT
SYR BULB 3OZ (MISCELLANEOUS) ×3 IMPLANT
SYR CONTROL 10ML LL (SYRINGE) ×3 IMPLANT
TOWEL OR 17X24 6PK STRL BLUE (TOWEL DISPOSABLE) ×6 IMPLANT
UNDERPAD 30X30 (UNDERPADS AND DIAPERS) ×3 IMPLANT

## 2014-12-22 NOTE — Brief Op Note (Signed)
12/22/2014  11:49 AM  PATIENT:  Christina Booker  74 y.o. female  PRE-OPERATIVE DIAGNOSIS:  Left Hand Cyst  POST-OPERATIVE DIAGNOSIS:  Left Hand Cyst  PROCEDURE:  Procedure(s): Excision Left Hand Ganglion cyst (Left)  SURGEON:  Surgeon(s) and Role:    * Marybelle Killings, MD - Primary  PHYSICIAN ASSISTANT:   ASSISTANTS: none   ANESTHESIA:   local and general  EBL:     BLOOD ADMINISTERED:none  DRAINS: none   LOCAL MEDICATIONS USED:  MARCAINE     SPECIMEN:  Source of Specimen:  hand, grossly ganglion cyst of tendon sheath  DISPOSITION OF SPECIMEN:  PATHOLOGY  COUNTS:  YES  TOURNIQUET:   Total Tourniquet Time Documented: Upper Arm (Left) - 14 minutes Total: Upper Arm (Left) - 14 minutes   DICTATION: .Other Dictation: Dictation Number [id number]  PLAN OF CARE: Discharge to home after PACU  PATIENT DISPOSITION:  PACU - hemodynamically stable.   Delay start of Pharmacological VTE agent (>24hrs) due to surgical blood loss or risk of bleeding: not applicable

## 2014-12-22 NOTE — Interval H&P Note (Signed)
History and Physical Interval Note:  12/22/2014 10:56 AM  Christina Booker  has presented today for surgery, with the diagnosis of Left Hand Cyst  The various methods of treatment have been discussed with the patient and family. After consideration of risks, benefits and other options for treatment, the patient has consented to  Procedure(s): Excision Left Hand Palmar cyst (Left) as a surgical intervention .  The patient's history has been reviewed, patient examined, no change in status, stable for surgery.  I have reviewed the patient's chart and labs.  Questions were answered to the patient's satisfaction.     [name] C

## 2014-12-22 NOTE — Anesthesia Procedure Notes (Signed)
Procedure Name: LMA Insertion Date/Time: 12/22/2014 11:09 AM Performed by: Lyndee Leo Pre-anesthesia Checklist: Patient identified, Emergency Drugs available, Suction available and Patient being monitored Patient Re-evaluated:Patient Re-evaluated prior to inductionOxygen Delivery Method: Circle System Utilized Preoxygenation: Pre-oxygenation with 100% oxygen Intubation Type: IV induction Ventilation: Mask ventilation without difficulty LMA: LMA inserted LMA Size: 4.0 Number of attempts: 1 Airway Equipment and Method: Bite block Placement Confirmation: positive ETCO2 Tube secured with: Tape Dental Injury: Teeth and Oropharynx as per pre-operative assessment

## 2014-12-22 NOTE — Discharge Summary (Signed)
  Patient had hand surgery never admitted. Went home after procedure. No admission

## 2014-12-22 NOTE — H&P (Signed)
Christina Booker is an 74 y.o. female.   A 74 year old female returns.  She had previous lumbar fusion in February.  She has done well.  Off pain medicine in a week or so.  She has a new problem which is a painful left hand, palmar cyst.  When she walks which she likes to do daily with her husband, her hand tends to ache, throbs.  She has not done any abnormal activity with squeezing.  It is adjacent to her flexor tendon.  It sometimes gets bigger if she is using her hand more.  She denies any numbness or tingling in the long finger.  It does not wake her up at night.  No fever or chills.  She is here with her husband Cletus Gash.   CURRENT MEDICATIONS:   Reviewed and updated and are unchanged from 02/05/2014.     PAST SURGICAL HISTORY:   No change.  She has had carpal tunnel releases both hands.  Fusion 2016 was L4-5 single level for spondylolisthesis.   ALLERGIES:   Penicillin.     Past Medical History  Diagnosis Date  . Hypertension   . Stroke ([place]) 12/01/2009    tia  . Irritable bowel syndrome   . Rosacea   . Arthritis     hands, back    Past Surgical History  Procedure Laterality Date  . Carpal tunnel release Right 2009  . Carpal tunnel release Left   . Hemorrhoid surgery    . Abdominal hysterectomy    . Tubal ligation    . Back surgery      Family History  Problem Relation Age of Onset  . Cancer Mother   . Uterine cancer Mother   . Hypertension Mother   . Heart attack Father    Social History:  reports that she quit smoking about 22 years ago. She has never used smokeless tobacco. She reports that she drinks about 2.4 oz of alcohol per week. She reports that she does not use illicit drugs.  Allergies:  Allergies  Allergen Reactions  . Penicillins Rash    Medications Prior to Admission  Medication Sig Dispense Refill  . aspirin 325 MG tablet Take 325 mg by mouth daily.    Marland Kitchen atorvastatin (LIPITOR) 20 MG tablet Take 20 mg by mouth every evening.    Marland Kitchen buPROPion  (WELLBUTRIN XL) 150 MG 24 hr tablet Take 150 mg by mouth every morning.    . Cholecalciferol 1000 UNITS capsule Take 1,000 Units by mouth 2 (two) times daily.    Marland Kitchen dicyclomine (BENTYL) 10 MG capsule Take 10 mg by mouth 2 (two) times daily as needed for spasms.    Marland Kitchen lisinopril (PRINIVIL,ZESTRIL) 5 MG tablet Take 5 mg by mouth every evening.    . polyethylene glycol powder (GLYCOLAX/MIRALAX) powder Take by mouth every evening. 1 tablespoon    . Wheat Dextrin (BENEFIBER PO) Take by mouth every morning. 1 tablespoon    . glycerin adult 2 G SUPP Place 1 suppository rectally 3 (three) times daily as needed (constipation).      No results found for this or any previous visit (from the past 48 hour(s)). No results found.  Review of Systems  Constitutional: Negative.   HENT: Negative.   Respiratory: Negative.   Cardiovascular: Negative.   Genitourinary: Negative.   Neurological: Negative.   Psychiatric/Behavioral: Negative.     Blood pressure 147/69, pulse 64, temperature 97.7 F (36.5 C), temperature source Oral, resp. rate 16, height 5' (1.524 m), weight  78.109 kg (172 lb 3.2 oz), SpO2 98 %. Physical Exam  Constitutional: She is oriented to person, place, and time. No distress.  HENT:  Head: Atraumatic.  Eyes: EOM are normal.  Neck: Normal range of motion.  Respiratory: No respiratory distress.  GI: She exhibits no distension.  Musculoskeletal: She exhibits tenderness.  Neurological: She is alert and oriented to person, place, and time.  Skin: Skin is warm and dry.  Psychiatric: She has a normal mood and affect.    PHYSICAL EXAMINATION:  The patient is alert and oriented, cooperative, pleasant.  Extraocular movements intact.  No rash over exposed skin.  The lumbar incision is well-healed.  She has a palpable mass left hand adjacent to the flexor tendon, adjacent to the A1 pulley.  It is tender with palpation.  Normal digital Allen test.  Profundus sublimi are intact.  No palmar  fasciitis.  The carpal tunnel incision is well-healed.  Negative Phalen, negative Tinel.     ASSESSMENT:   Likely ganglion of the flexor tendon sheath.   PLAN:  We discussed options including injection.  She states that she does not want to consider injection.  She would like to proceed with cyst excision as an outpatient.  It bothers her on a daily basis with home activities.  She can have this under short general anesthetic.  We discussed a V-shaped incision, isolation of the nerve to protect it and then removal of the cyst.  She understands that there is about a 10% chance of cyst recurrence.  Questions were elicited and answered and she requested we proceed.  Mette Southgate M 12/22/2014, 10:33 AM

## 2014-12-22 NOTE — Anesthesia Postprocedure Evaluation (Signed)
Anesthesia Post Note  Patient: Christina Booker  Procedure(s) Performed: Procedure(s) (LRB): Excision Left Hand Ganglion cyst (Left)  Anesthesia type: General  Patient location: PACU  Post pain: Pain level controlled  Post assessment: Post-op Vital signs reviewed  Last Vitals: BP 139/70 mmHg  Pulse 69  Temp(Src) 36.6 C (Oral)  Resp 16  Ht 5' (1.524 m)  Wt 172 lb 3.2 oz (78.109 kg)  BMI 33.63 kg/m2  SpO2 99%  Post vital signs: Reviewed  Level of consciousness: sedated  Complications: No apparent anesthesia complications

## 2014-12-22 NOTE — Transfer of Care (Signed)
Immediate Anesthesia Transfer of Care Note  Patient: Christina Booker  Procedure(s) Performed: Procedure(s): Excision Left Hand Ganglion cyst (Left)  Patient Location: PACU  Anesthesia Type:GA combined with regional for post-op pain  Level of Consciousness: awake, sedated and patient cooperative  Airway & Oxygen Therapy: Patient Spontanous Breathing and Patient connected to face mask oxygen  Post-op Assessment: Report given to RN and Post -op Vital signs reviewed and stable  Post vital signs: Reviewed and stable  Last Vitals:  Filed Vitals:   12/22/14 0952  BP: 147/69  Pulse: 64  Temp: 36.5 C  Resp: 16    Complications: No apparent anesthesia complications

## 2014-12-22 NOTE — Discharge Instructions (Signed)
°  Post Anesthesia Home Care Instructions  Activity: Get plenty of rest for the remainder of the day. A responsible adult should stay with you for 24 hours following the procedure.  For the next 24 hours, DO NOT: -Drive a car -Paediatric nurse -Drink alcoholic beverages -Take any medication unless instructed by your physician -Make any legal decisions or sign important papers.  Meals: Start with liquid foods such as gelatin or soup. Progress to regular foods as tolerated. Avoid greasy, spicy, heavy foods. If nausea and/or vomiting occur, drink only clear liquids until the nausea and/or vomiting subsides. Call your physician if vomiting continues.  Special Instructions/Symptoms: Your throat may feel dry or sore from the anesthesia or the breathing tube placed in your throat during surgery. If this causes discomfort, gargle with warm salt water. The discomfort should disappear within 24 hours.  If you had a scopolamine patch placed behind your ear for the management of post- operative nausea and/or vomiting:  1. The medication in the patch is effective for 72 hours, after which it should be removed.  Wrap patch in a tissue and discard in the trash. Wash hands thoroughly with soap and water. 2. You may remove the patch earlier than 72 hours if you experience unpleasant side effects which may include dry mouth, dizziness or visual disturbances. 3. Avoid touching the patch. Wash your hands with soap and water after contact with the patch.   Keep hand elevated to help with pain and swelling for 24 to 48 hrs. Keep dressing dry. OK to shower with hand elevated and bag over hand. See Dr Lorin Mercy in about one week.

## 2014-12-22 NOTE — Anesthesia Preprocedure Evaluation (Addendum)
Anesthesia Evaluation  Patient identified by MRN, date of birth, ID band Patient awake    Reviewed: Allergy & Precautions, NPO status , Patient's Chart, lab work & pertinent test results  Airway Mallampati: II  TM Distance: >3 FB Neck ROM: Full    Dental   Pulmonary former smoker,    breath sounds clear to auscultation       Cardiovascular hypertension, Pt. on medications  Rhythm:Regular Rate:Normal  8/15 Normal stress test with no evidence inducible ischemia.   Neuro/Psych TIA   GI/Hepatic negative GI ROS, Neg liver ROS,   Endo/Other  Morbid obesity  Renal/GU negative Renal ROS     Musculoskeletal  (+) Arthritis ,   Abdominal   Peds  Hematology negative hematology ROS (+)   Anesthesia Other Findings   Reproductive/Obstetrics                        Anesthesia Physical  Anesthesia Plan  ASA: III  Anesthesia Plan: Bier Block and General   Post-op Pain Management:    Induction: Intravenous  Airway Management Planned: LMA  Additional Equipment:   Intra-op Plan:   Post-operative Plan:   Informed Consent: I have reviewed the patients History and Physical, chart, labs and discussed the procedure including the risks, benefits and alternatives for the proposed anesthesia with the patient or authorized representative who has indicated his/her understanding and acceptance.   Dental advisory given  Plan Discussed with: CRNA  Anesthesia Plan Comments:       Anesthesia Quick Evaluation

## 2014-12-23 ENCOUNTER — Encounter (HOSPITAL_BASED_OUTPATIENT_CLINIC_OR_DEPARTMENT_OTHER): Payer: Self-pay | Admitting: Orthopaedic Surgery

## 2014-12-24 NOTE — Op Note (Signed)
NAMESHARINA, Christina Booker             ACCOUNT NO.:  [id number]  MEDICAL RECORD NO.:  [id number]  LOCATION:                               FACILITY:  [facility]  PHYSICIAN:  Brendan Gruwell C. Lorin Mercy, M.D.    DATE OF BIRTH:  29-Jun-1940  DATE OF PROCEDURE:  12/22/2014 DATE OF DISCHARGE:  12/22/2014                              OPERATIVE REPORT   PREOPERATIVE DIAGNOSIS:  Left hand ganglion of the tendon sheath, left palm.  POSTOPERATIVE DIAGNOSIS:  Left hand ganglion of the tendon sheath, left palm.  PROCEDURE:  Excision of left thumb ganglion cyst of the tendon sheath.  SURGEON:  Yazmeen Woolf C. Lorin Mercy, M.D.  ANESTHESIA:  Local plus general.  TOURNIQUET TIME:  Less than 30 minutes.  BRIEF HISTORY:  A 74 year old female, has painful ganglion with gripping and walking.  She has throbbing pain in her hand with ganglion 1-2 cm in size just proximal and slightly toward the radial aspect of the A1 pulley of the left ring finger.  The patient desired to have the cyst excised.  DESCRIPTION OF PROCEDURE:  After induction of general anesthesia, prepping proximal arm tourniquet.  A time-out procedure, Ancef prophylaxis.  A V-shaped incision was made Zig-Zag starting at the proximal finger crease of the ring finger.  Flap was developed and a tag suture 4-0 nylon was placed.  This extended and subcutaneous tissue was bluntly dissected in the midline over the palpable flexor tendon. Palmar fascia was divided and the radial neurovascular bundle was identified.  Nerve was carefully protected.  Large ganglion was immediately present, that was multiloculated and was 1.5 to 2 cm in size.  It was followed down to the base of the tendon sheath and then excised.  The A1 pulley was well-visualized.  It was split and there were no masses inside the tendon sheath.  The patient had not had any triggering.  Bipolar had been used for meticulous hemostasis. Tourniquet was deflated.  Pressure was held for 2 minutes.  Then hemostasis  was obtained with a bipolar.  Neurovascular bundle was again inspected and was well-preserved.  No remaining ganglion was present. Ganglion was sent out for pathologic exam and had typical ganglion fluid present inside it.  4-0 nylon was used for interrupted closure.  Xeroform, 4x4s, twiners, clean Webril and Ace wrap were applied for postoperative dressing.  Outpatient surgery was appropriate for treatment of this condition.  The patient will be sent home and office for followup in 1 week.     Kingston Shawgo C. Lorin Mercy, M.D.     MCY/MEDQ  D:  12/22/2014  T:  12/23/2014  Job:  [id number]

## 2015-03-24 DIAGNOSIS — M25511 Pain in right shoulder: Secondary | ICD-10-CM | POA: Diagnosis not present

## 2015-07-30 DIAGNOSIS — I10 Essential (primary) hypertension: Secondary | ICD-10-CM | POA: Diagnosis not present

## 2015-07-30 DIAGNOSIS — F3340 Major depressive disorder, recurrent, in remission, unspecified: Secondary | ICD-10-CM | POA: Diagnosis not present

## 2015-07-30 DIAGNOSIS — Z79899 Other long term (current) drug therapy: Secondary | ICD-10-CM | POA: Diagnosis not present

## 2015-07-30 DIAGNOSIS — M791 Myalgia: Secondary | ICD-10-CM | POA: Diagnosis not present

## 2015-07-30 DIAGNOSIS — F5101 Primary insomnia: Secondary | ICD-10-CM | POA: Diagnosis not present

## 2015-07-30 DIAGNOSIS — E782 Mixed hyperlipidemia: Secondary | ICD-10-CM | POA: Diagnosis not present

## 2015-07-30 DIAGNOSIS — R7309 Other abnormal glucose: Secondary | ICD-10-CM | POA: Diagnosis not present

## 2015-07-30 DIAGNOSIS — M150 Primary generalized (osteo)arthritis: Secondary | ICD-10-CM | POA: Diagnosis not present

## 2015-07-30 DIAGNOSIS — K589 Irritable bowel syndrome without diarrhea: Secondary | ICD-10-CM | POA: Diagnosis not present

## 2015-08-04 DIAGNOSIS — L728 Other follicular cysts of the skin and subcutaneous tissue: Secondary | ICD-10-CM | POA: Diagnosis not present

## 2015-08-04 DIAGNOSIS — L820 Inflamed seborrheic keratosis: Secondary | ICD-10-CM | POA: Diagnosis not present

## 2015-08-20 DIAGNOSIS — M150 Primary generalized (osteo)arthritis: Secondary | ICD-10-CM | POA: Diagnosis not present

## 2015-08-20 DIAGNOSIS — R2232 Localized swelling, mass and lump, left upper limb: Secondary | ICD-10-CM | POA: Diagnosis not present

## 2015-08-20 DIAGNOSIS — Z1231 Encounter for screening mammogram for malignant neoplasm of breast: Secondary | ICD-10-CM | POA: Diagnosis not present

## 2015-08-20 DIAGNOSIS — G5690 Unspecified mononeuropathy of unspecified upper limb: Secondary | ICD-10-CM | POA: Diagnosis not present

## 2015-08-20 DIAGNOSIS — Z79899 Other long term (current) drug therapy: Secondary | ICD-10-CM | POA: Diagnosis not present

## 2015-08-24 ENCOUNTER — Other Ambulatory Visit: Payer: Self-pay | Admitting: Internal Medicine

## 2015-08-24 DIAGNOSIS — N6320 Unspecified lump in the left breast, unspecified quadrant: Secondary | ICD-10-CM

## 2015-08-24 DIAGNOSIS — Z1231 Encounter for screening mammogram for malignant neoplasm of breast: Secondary | ICD-10-CM

## 2015-08-27 DIAGNOSIS — Z79899 Other long term (current) drug therapy: Secondary | ICD-10-CM | POA: Diagnosis not present

## 2015-08-31 ENCOUNTER — Ambulatory Visit
Admission: RE | Admit: 2015-08-31 | Discharge: 2015-08-31 | Disposition: A | Discharge status: Home / Self Care | Payer: PPO | Source: Ambulatory Visit | Attending: Internal Medicine | Admitting: Internal Medicine

## 2015-08-31 DIAGNOSIS — H2513 Age-related nuclear cataract, bilateral: Secondary | ICD-10-CM | POA: Diagnosis not present

## 2015-08-31 DIAGNOSIS — H52223 Regular astigmatism, bilateral: Secondary | ICD-10-CM | POA: Diagnosis not present

## 2015-08-31 DIAGNOSIS — N6320 Unspecified lump in the left breast, unspecified quadrant: Secondary | ICD-10-CM

## 2015-08-31 DIAGNOSIS — H524 Presbyopia: Secondary | ICD-10-CM | POA: Diagnosis not present

## 2015-08-31 DIAGNOSIS — N644 Mastodynia: Secondary | ICD-10-CM | POA: Diagnosis not present

## 2015-09-01 ENCOUNTER — Other Ambulatory Visit: Payer: Self-pay

## 2015-09-01 NOTE — Patient Outreach (Signed)
[facility]) Care Management  09/01/2015  Christina Booker 06-May-1940 [id number]   Telephone Screen  Referral Date: 09/01/15 Referral Source: [facility] concierges Referral Reason: "patient wants to talk with a nurse"   Outreach attempt #1 to patient. Patient reached. She states that she "doesn't have a computer" and "can't look this stuff up online." She is inquiring about an ongoing issue she has been experiencing. She complains of mild pain to "under part of left upper arm." She has been taking Ibuprofen prn with some relief. She saw PCP recently and they were unable to determine what was causing the issue. Patient states that all her tests have come back negative including diagnostic mammogram and ultrasound. She voices that she did see an ortho MD about a year or so ago for ganglion issues and had it removed. Encouraged patient to follow up with MDs regarding her concerns. Patient needs any issues with meds or transportation. She is self sufficient and voices being able to manage her care independently. No RN CM needs at this time. She was agreeable to RN CM mailing out [facility] brochure and contact info for future needs or concerns.    Plan: RN CM will send [facility] brochure and letter to patient.    Enzo Montgomery, RN,BSN,CCM [facility] Telephonic Care Management Coordinator Direct Phone: [phone] Toll Free: [phone] Fax: [phone]

## 2015-09-03 DIAGNOSIS — M791 Myalgia: Secondary | ICD-10-CM | POA: Diagnosis not present

## 2015-09-03 DIAGNOSIS — R21 Rash and other nonspecific skin eruption: Secondary | ICD-10-CM | POA: Diagnosis not present

## 2015-09-25 ENCOUNTER — Other Ambulatory Visit: Payer: Self-pay | Admitting: *Deleted

## 2015-09-25 NOTE — Patient Outreach (Signed)
[facility]) Care Management  09/25/2015  Christina Booker 05/16/1940 [id number]  Subjective: Telephone call to patient's home number, spoke with patient, and HIPAA verified.  Patient also verified spelling of her last name and that she did call the 24 hour nurse line on 09/21/15.   Discussed [facility] 24 hour nurse advice line follow up and care management services.   Patient voices understanding and verified speaking with another [place] on  09/01/15.   Patient states she is okay, has not followed up with her MD, since nurse line call on 09/21/15, just living with the periodic pain, and will follow up if the pain gets worse.    States she is able to self manage her healthcare, realizes she is getting older, weird things seem to happen to her, will continue to live with pain, and the tingling feeling she gets in her arm at night.   States she has been having this pain and funny feeling in her arm for awhile.   States she has seen primary MD for her symptoms, testing completed without abnormal findings,  MD recommended that she see a neurologist if symptoms worsen and continue.   States states she has not followed up on the neurologist appointment to date.   RNCM advised that even if insurance company does not require a referral for specialist services, neurologist office may require a referral from primary MD because of being a specialist and per specialist office policy.   Patient voices understanding and states she will up with primary MD is she decides to pursue the neurologist referral.  States she is getting to leave for vacation and is very appreciative of RNCM follow up call.   States she has received [facility] Care Management information in the past and will call if she has any additional questions.  Patient does not have any disease management, disease monitoring, disease education, care coordination, community resource, transportation, or pharmacy needs at this  time.    Objective: Per chart review: Patient has not has any recent hospitalizations or ED visits.   [place] Management telephone screen completed on 09/01/15 and no care management needs identified.   Assessment: Received 24 hour nurse line referral on 09/22/15.    Referral source states:  Patient called in having pain underneath left arm.  It is like stinging feeling that started in December.  It got worse.  She has seen the MD about this, had mammogram, and ultrasound.   Vitals and blood work were good.  Might send to neurologist.   MD did a lot of test and patient still having pain.   Plan: RNCM will send case closure due to follow up completed / no care management needs to [facility] [facility].   Jovan Colligan H. Annia Friendly, BSN, [place] [facility] Telephonic CM Phone: [phone] Fax: [phone]

## 2015-11-17 DIAGNOSIS — H2511 Age-related nuclear cataract, right eye: Secondary | ICD-10-CM | POA: Diagnosis not present

## 2015-12-08 DIAGNOSIS — Z79899 Other long term (current) drug therapy: Secondary | ICD-10-CM | POA: Diagnosis not present

## 2015-12-08 DIAGNOSIS — K219 Gastro-esophageal reflux disease without esophagitis: Secondary | ICD-10-CM | POA: Diagnosis not present

## 2015-12-08 DIAGNOSIS — H2511 Age-related nuclear cataract, right eye: Secondary | ICD-10-CM | POA: Diagnosis not present

## 2015-12-08 DIAGNOSIS — E785 Hyperlipidemia, unspecified: Secondary | ICD-10-CM | POA: Diagnosis not present

## 2015-12-08 DIAGNOSIS — I10 Essential (primary) hypertension: Secondary | ICD-10-CM | POA: Diagnosis not present

## 2015-12-08 DIAGNOSIS — Z8673 Personal history of transient ischemic attack (TIA), and cerebral infarction without residual deficits: Secondary | ICD-10-CM | POA: Diagnosis not present

## 2015-12-08 DIAGNOSIS — Z87891 Personal history of nicotine dependence: Secondary | ICD-10-CM | POA: Diagnosis not present

## 2015-12-08 DIAGNOSIS — H9190 Unspecified hearing loss, unspecified ear: Secondary | ICD-10-CM | POA: Diagnosis not present

## 2015-12-08 DIAGNOSIS — H2513 Age-related nuclear cataract, bilateral: Secondary | ICD-10-CM | POA: Diagnosis not present

## 2015-12-22 DIAGNOSIS — Z79899 Other long term (current) drug therapy: Secondary | ICD-10-CM | POA: Diagnosis not present

## 2015-12-22 DIAGNOSIS — K589 Irritable bowel syndrome without diarrhea: Secondary | ICD-10-CM | POA: Diagnosis not present

## 2015-12-22 DIAGNOSIS — Z8673 Personal history of transient ischemic attack (TIA), and cerebral infarction without residual deficits: Secondary | ICD-10-CM | POA: Diagnosis not present

## 2015-12-22 DIAGNOSIS — Z7982 Long term (current) use of aspirin: Secondary | ICD-10-CM | POA: Diagnosis not present

## 2015-12-22 DIAGNOSIS — E785 Hyperlipidemia, unspecified: Secondary | ICD-10-CM | POA: Diagnosis not present

## 2015-12-22 DIAGNOSIS — H2512 Age-related nuclear cataract, left eye: Secondary | ICD-10-CM | POA: Diagnosis not present

## 2015-12-22 DIAGNOSIS — I10 Essential (primary) hypertension: Secondary | ICD-10-CM | POA: Diagnosis not present

## 2015-12-22 DIAGNOSIS — F329 Major depressive disorder, single episode, unspecified: Secondary | ICD-10-CM | POA: Diagnosis not present

## 2015-12-22 DIAGNOSIS — M1990 Unspecified osteoarthritis, unspecified site: Secondary | ICD-10-CM | POA: Diagnosis not present

## 2015-12-22 DIAGNOSIS — K219 Gastro-esophageal reflux disease without esophagitis: Secondary | ICD-10-CM | POA: Diagnosis not present

## 2015-12-22 DIAGNOSIS — H2513 Age-related nuclear cataract, bilateral: Secondary | ICD-10-CM | POA: Diagnosis not present

## 2015-12-22 DIAGNOSIS — Z87891 Personal history of nicotine dependence: Secondary | ICD-10-CM | POA: Diagnosis not present

## 2015-12-22 DIAGNOSIS — H259 Unspecified age-related cataract: Secondary | ICD-10-CM | POA: Diagnosis not present

## 2016-03-22 DIAGNOSIS — R29898 Other symptoms and signs involving the musculoskeletal system: Secondary | ICD-10-CM | POA: Diagnosis not present

## 2016-03-22 DIAGNOSIS — M542 Cervicalgia: Secondary | ICD-10-CM | POA: Diagnosis not present

## 2016-03-22 DIAGNOSIS — R208 Other disturbances of skin sensation: Secondary | ICD-10-CM | POA: Diagnosis not present

## 2016-03-22 DIAGNOSIS — Z9181 History of falling: Secondary | ICD-10-CM | POA: Diagnosis not present

## 2016-03-22 DIAGNOSIS — G629 Polyneuropathy, unspecified: Secondary | ICD-10-CM | POA: Diagnosis not present

## 2016-04-06 DIAGNOSIS — M5416 Radiculopathy, lumbar region: Secondary | ICD-10-CM | POA: Diagnosis not present

## 2016-04-06 DIAGNOSIS — M150 Primary generalized (osteo)arthritis: Secondary | ICD-10-CM | POA: Diagnosis not present

## 2016-04-13 DIAGNOSIS — E559 Vitamin D deficiency, unspecified: Secondary | ICD-10-CM | POA: Diagnosis not present

## 2016-04-13 DIAGNOSIS — F3340 Major depressive disorder, recurrent, in remission, unspecified: Secondary | ICD-10-CM | POA: Diagnosis not present

## 2016-04-13 DIAGNOSIS — I10 Essential (primary) hypertension: Secondary | ICD-10-CM | POA: Diagnosis not present

## 2016-04-13 DIAGNOSIS — F5101 Primary insomnia: Secondary | ICD-10-CM | POA: Diagnosis not present

## 2016-04-13 DIAGNOSIS — R5381 Other malaise: Secondary | ICD-10-CM | POA: Diagnosis not present

## 2016-04-13 DIAGNOSIS — M150 Primary generalized (osteo)arthritis: Secondary | ICD-10-CM | POA: Diagnosis not present

## 2016-04-13 DIAGNOSIS — E782 Mixed hyperlipidemia: Secondary | ICD-10-CM | POA: Diagnosis not present

## 2016-04-13 DIAGNOSIS — R5383 Other fatigue: Secondary | ICD-10-CM | POA: Diagnosis not present

## 2016-04-13 DIAGNOSIS — R413 Other amnesia: Secondary | ICD-10-CM | POA: Diagnosis not present

## 2016-04-13 DIAGNOSIS — K589 Irritable bowel syndrome without diarrhea: Secondary | ICD-10-CM | POA: Diagnosis not present

## 2016-04-13 DIAGNOSIS — R7309 Other abnormal glucose: Secondary | ICD-10-CM | POA: Diagnosis not present

## 2016-04-22 DIAGNOSIS — M50223 Other cervical disc displacement at C6-C7 level: Secondary | ICD-10-CM | POA: Diagnosis not present

## 2016-04-22 DIAGNOSIS — M5412 Radiculopathy, cervical region: Secondary | ICD-10-CM | POA: Diagnosis not present

## 2016-04-22 DIAGNOSIS — M5080 Other cervical disc disorders, unspecified cervical region: Secondary | ICD-10-CM | POA: Diagnosis not present

## 2016-04-27 ENCOUNTER — Ambulatory Visit (INDEPENDENT_AMBULATORY_CARE_PROVIDER_SITE_OTHER): Payer: PPO

## 2016-04-27 ENCOUNTER — Ambulatory Visit (INDEPENDENT_AMBULATORY_CARE_PROVIDER_SITE_OTHER): Payer: PPO | Admitting: Surgery

## 2016-04-27 ENCOUNTER — Encounter (INDEPENDENT_AMBULATORY_CARE_PROVIDER_SITE_OTHER): Payer: Self-pay | Admitting: Surgery

## 2016-04-27 ENCOUNTER — Encounter (INDEPENDENT_AMBULATORY_CARE_PROVIDER_SITE_OTHER): Payer: Self-pay

## 2016-04-27 VITALS — BP 166/90 | HR 70

## 2016-04-27 DIAGNOSIS — M542 Cervicalgia: Secondary | ICD-10-CM | POA: Diagnosis not present

## 2016-04-27 DIAGNOSIS — M545 Low back pain: Secondary | ICD-10-CM

## 2016-04-27 NOTE — Progress Notes (Signed)
Office Visit Note   Patient: Christina Booker           Date of Birth: [birth year]           MRN: [id number] Visit Date: 04/27/2016              Requested by: Raina Mina, MD [address], [place] [address] PCP: Gilford Rile, MD   Assessment & Plan: Visit Diagnoses:  1. Cervicalgia   2. Right low back pain, unspecified chronicity, with sciatica presence unspecified     Plan: With patient's ongoing low back pain and right lower extremity radiculopathy I scheduled a lumbar spine MRI to rule out HNP/stenosis. Patient will follow up in the office with Dr. Lorin Mercy to discuss findings on the cervical and lumbar spine MRI scans and to go over treatment options. I did review the cervical MRI with patient and her husband who was present today. I did as patient to bring in all of her office notes from her neurologist Dr Huntley Dec office for Dr. Lorin Mercy to review.  All questions answered.  Follow-Up Instructions: Return for yates after MRI lumbar spine. Dr. Lorin Mercy must be in clinic.   Orders:  Orders Placed This Encounter  Procedures  . XR Cervical Spine 2 or 3 views  . XR Lumbar Spine 2-3 Views  . MR Lumbar Spine W Wo Contrast   No orders of the defined types were placed in this encounter.     Procedures: No procedures performed   Clinical Data: No additional findings.   Subjective: Chief Complaint  Patient presents with  . Left Arm - Pain  . Right Arm - Pain  . Lower Back - Pain    Christina Booker is here for bilateral arm pain and low back pain.  She states that she is having right groin and thigh pain, states that the samething happened on the left side and had surgery to fix it.  She has been on pred pak that has helped in the past, but the most recent pak did not last long for her. Wants to get something done before it gets to bad.   She is also having bilateral arm pain that has been going on for 1.5 years and she recently saw neurologist and she wasn't impressed,  they did an MRI of her neck.  She brought MRI in on CD (on canopy also).  Patient states neck pain and left greater than right upper extremity radicular pain and numbness tingling burning 2-3 months. This is been progressively worsening. Over the last year she's had multiple prednisone tapers for neck and low back symptoms. She is followed by a neurologist in [place] gave a prednisone taper last month that did not give any improvement. He did order a cervical spine MRI scan which was done 04/22/2016. Report read okay ligamentum flavum thickening and a disc bulge at C7-T1 that effaces CSF about the cord. Small focus of myelomalacia within the cord is likely related to spondylosis. Moderate to moderately severe foraminal narrowing at C3-4 is worse on the right. Mild deformity of the right hemi-cord and C4-5 due to disc. Flattening of the ventral cord and severe left and moderately severe right foraminal narrowing C5-6. She denies any upper or lower extremity weakness. No complaints of bowel or bladder incontinence. No balance issues. She did not bring any records from her neurologist office for [place] to review. Over the last year associated having increased low back pain and right lower extremity radiculopathy into  the right thigh. Nothing radiating below the knee. No radicular symptoms on the left. Status post L4-L5 instrumented fusion by Dr. Lorin Mercy February 2016.  Review of Systems  Constitutional: Negative.   HENT: Negative.   Respiratory: Negative.   Cardiovascular: Negative.   Genitourinary: Negative.   Musculoskeletal: Positive for back pain, myalgias, neck pain and neck stiffness. Negative for gait problem.  Skin: Negative.   Neurological: Positive for numbness. Negative for weakness.  Psychiatric/Behavioral: Negative.      Objective: Vital Signs: BP (!) 166/90   Pulse 70   Physical Exam  Constitutional: She is oriented to person, place, and time. No distress.  HENT:  Head: Normocephalic  and atraumatic.  Eyes: EOM are normal. Pupils are equal, round, and reactive to light.  Neck:  Bilateral brachial plexus trapezius tenderness.  Pulmonary/Chest: No respiratory distress.  Abdominal: She exhibits no distension.  Musculoskeletal:  Bilateral shoulders good range of motion. Negative impingement test. Good cuff strength. No weakness of the bilateral biceps or triceps. Bilateral elbows unremarkable. She has lumbar paraspinal tenderness. Bilateral hips good range of motion. Negative straight leg raise. Nontender over the bilateral hip greater trochanter versus. No focal motor deficits upper or lower extremity's. Gait is normal.  Neurological: She is alert and oriented to person, place, and time.  Skin: Skin is warm and dry.    Ortho Exam  Specialty Comments:  No specialty comments available.  Imaging: No results found.   PMFS History: Patient Active Problem List   Diagnosis Date Noted  . Ganglion of left hand 12/22/2014  . S/P lumbar fusion 04/16/2014   Past Medical History:  Diagnosis Date  . Arthritis    hands, back  . Hypertension   . Irritable bowel syndrome   . Rosacea   . Stroke [facility]) 12/01/2009   tia    Family History  Problem Relation Age of Onset  . Cancer Mother   . Uterine cancer Mother   . Hypertension Mother   . Heart attack Father     Past Surgical History:  Procedure Laterality Date  . ABDOMINAL HYSTERECTOMY    . BACK SURGERY    . CARPAL TUNNEL RELEASE Right 2009  . CARPAL TUNNEL RELEASE Left   . GANGLION CYST EXCISION Left 12/22/2014   Procedure: Excision Left Hand Ganglion cyst;  Surgeon: Marybelle Killings, MD;  Location: [facility];  Service: Orthopedics;  Laterality: Left;  . HEMORRHOID SURGERY    . TUBAL LIGATION     Social History   Occupational History  . Not on file.   Social History Main Topics  . Smoking status: Former Smoker    Quit date: 04/09/1992  . Smokeless tobacco: Never Used  . Alcohol use 2.4 oz/week     4 Glasses of wine per week     Comment: 4  . Drug use: No  . Sexual activity: Not on file

## 2016-04-29 ENCOUNTER — Telehealth (INDEPENDENT_AMBULATORY_CARE_PROVIDER_SITE_OTHER): Payer: Self-pay | Admitting: Orthopaedic Surgery

## 2016-04-29 MED ORDER — TRAMADOL HCL 50 MG PO TABS: 50.0000 mg | ORAL_TABLET | Freq: Two times a day (BID) | ORAL | 0 refills | Status: DC

## 2016-04-29 NOTE — Telephone Encounter (Signed)
Patient called saying she is in a lot of pain. Wanted to know if she could be prescribed something today for the pain. She said that tramadol worked really well for her in the past. CB # [phone]

## 2016-04-29 NOTE — Telephone Encounter (Signed)
IC patient and advised I just s/w them and they do have the prescription ready for pickup

## 2016-04-29 NOTE — Addendum Note (Signed)
Addended by: Rozell Searing L on: 04/29/2016 11:30 AM   Modules accepted: Orders

## 2016-04-29 NOTE — Telephone Encounter (Signed)
Patient just called back and said that [facility] in [place] did not receive the Rx. Please send again. Thank you. CB # [phone]

## 2016-04-29 NOTE — Telephone Encounter (Signed)
Okay to give tramadol 50 mg 1 tab by mouth twice a day when necessary for pain #60 tablets no refills

## 2016-04-29 NOTE — Telephone Encounter (Signed)
I called prescription into patients pharmacy [facility] in [place]. I called patient to make aware.

## 2016-05-04 ENCOUNTER — Ambulatory Visit (INDEPENDENT_AMBULATORY_CARE_PROVIDER_SITE_OTHER): Payer: PPO | Admitting: Family

## 2016-05-04 DIAGNOSIS — M5416 Radiculopathy, lumbar region: Secondary | ICD-10-CM | POA: Diagnosis not present

## 2016-05-04 DIAGNOSIS — M542 Cervicalgia: Secondary | ICD-10-CM | POA: Diagnosis not present

## 2016-05-04 DIAGNOSIS — Z981 Arthrodesis status: Secondary | ICD-10-CM | POA: Diagnosis not present

## 2016-05-04 MED ORDER — PREDNISONE 10 MG PO TABS: ORAL_TABLET | ORAL | 0 refills | Status: DC

## 2016-05-05 NOTE — Progress Notes (Signed)
Office Visit Note   Patient: Christina Booker           Date of Birth: [birth year]           MRN: [id number] Visit Date: 05/04/2016              Requested by: Raina Mina, MD [address], [place] [address] PCP: Gilford Rile, MD  No chief complaint on file.   HPI: The patient is a 76 year old woman seen today for evaluation of continued upper extremity pain and numbness, left worse than right as well as low back pain radiating down the left thigh to the shin. States is miserable in pain. Hopes we can do something to ease her pain. Has appoiontment with Lorin Mercy on March 21, hopes to get more comfortable without having to wait that long.   She was seen last week by Jeneen Rinks who ordered an MRI of her lumbar spine. This has not yet been scheduled. Waiting on insurance approval.   She is following with [facility] Neurology for upper extremities and neck. They have done a recent MRI of her c spine. She provided a disc to [place] for review.   Has had prednisone tapers in the past as well a Kenalog injection that were moderately helpful. Most recently, about 4 weeks ago was placed on a prednisone taper, 5 mg that was less helpful.  She denies any upper or lower extremity weakness. No complaints of bowel or bladder incontinence. No balance issues.  Cervical spine MRI scan which was done 04/22/2016. Report read okay ligamentum flavum thickening and a disc bulge at C7-T1 that effaces CSF about the cord. Small focus of myelomalacia within the cord is likely related to spondylosis. Moderate to moderately severe foraminal narrowing at C3-4 is worse on the right. Mild deformity of the right hemi-cord and C4-5 due to disc. Flattening of the ventral cord and severe left and moderately severe right foraminal narrowing C5-6.  Assessment & Plan: Visit Diagnoses:  1. Cervicalgia   2. Lumbar radiculopathy   3. S/P lumbar fusion     Plan: Will refer her over to Dr. Ernestina Patches for [facility] of her c-spine. Have  provided a prescription for a prednisone taper to try to get her more comfortable in the interim. She may use her Tramadol as needed as well. We will call her to set her up for her MRI of the lumbar spine. Follow up in office with Yates for definitive plan.   Follow-Up Instructions: Return for as scheduled with yates.   Physical Exam  Constitutional: Appears well-developed.  Head: Normocephalic.  Eyes: EOM are normal.  Neck: Normal range of motion.  Cardiovascular: Normal rate.   Pulmonary/Chest: Effort normal.  Neurological: Is alert.  Skin: Skin is warm.  Psychiatric: Has a normal mood and affect.  Back Exam   Tenderness  The patient is experiencing tenderness in the lumbar.  Muscle Strength  The patient has normal back strength.  Tests  Straight leg raise right: negative Straight leg raise left: negative  Other  Gait: normal        Imaging: No results found.  Orders:  No orders of the defined types were placed in this encounter.  Meds ordered this encounter  Medications  . DISCONTD: predniSONE (DELTASONE) 10 MG tablet    Sig: 6 tablets for 2 days, then 5 tablets for 2 days, then 4 tablets for 2 days, then 3 tablets for 2 days, then 2 tablets for 2 days,  then 1 tablet for 2 days    Dispense:  42 tablet    Refill:  0  . DISCONTD: predniSONE (DELTASONE) 10 MG tablet    Sig: 6 tablets for 2 days, then 5 tablets for 2 days, then 4 tablets for 2 days, then 3 tablets for 2 days, then 2 tablets for 2 days, then 1 tablet for 2 days    Dispense:  42 tablet    Refill:  0  . predniSONE (DELTASONE) 10 MG tablet    Sig: 6 tablets for 2 days, then 5  for 2 days, then 12for 2 days, then 3  for 2 days, then 2 for 2 days, then 1 for 2 days    Dispense:  42 tablet    Refill:  0     Procedures: No procedures performed  Clinical Data: No additional findings.  Subjective: Review of Systems  Objective: Vital Signs: There were no vitals taken for this visit.  Specialty  Comments:  No specialty comments available.  PMFS History: Patient Active Problem List   Diagnosis Date Noted  . Ganglion of left hand 12/22/2014  . S/P lumbar fusion 04/16/2014   Past Medical History:  Diagnosis Date  . Arthritis    hands, back  . Hypertension   . Irritable bowel syndrome   . Rosacea   . Stroke [facility]) 12/01/2009   tia    Family History  Problem Relation Age of Onset  . Cancer Mother   . Uterine cancer Mother   . Hypertension Mother   . Heart attack Father     Past Surgical History:  Procedure Laterality Date  . ABDOMINAL HYSTERECTOMY    . BACK SURGERY    . CARPAL TUNNEL RELEASE Right 2009  . CARPAL TUNNEL RELEASE Left   . GANGLION CYST EXCISION Left 12/22/2014   Procedure: Excision Left Hand Ganglion cyst;  Surgeon: Marybelle Killings, MD;  Location: [place];  Service: Orthopedics;  Laterality: Left;  . HEMORRHOID SURGERY    . TUBAL LIGATION     Social History   Occupational History  . Not on file.   Social History Main Topics  . Smoking status: Former Smoker    Quit date: 04/09/1992  . Smokeless tobacco: Never Used  . Alcohol use 2.4 oz/week    4 Glasses of wine per week     Comment: 4  . Drug use: No  . Sexual activity: Not on file

## 2016-05-10 ENCOUNTER — Telehealth (INDEPENDENT_AMBULATORY_CARE_PROVIDER_SITE_OTHER): Payer: Self-pay | Admitting: Physical Medicine and Rehabilitation

## 2016-05-10 ENCOUNTER — Encounter (INDEPENDENT_AMBULATORY_CARE_PROVIDER_SITE_OTHER): Payer: Self-pay | Admitting: Physical Medicine and Rehabilitation

## 2016-05-10 ENCOUNTER — Ambulatory Visit (INDEPENDENT_AMBULATORY_CARE_PROVIDER_SITE_OTHER): Payer: PPO | Admitting: Physical Medicine and Rehabilitation

## 2016-05-10 VITALS — BP 134/83 | HR 69

## 2016-05-10 DIAGNOSIS — M4802 Spinal stenosis, cervical region: Secondary | ICD-10-CM | POA: Diagnosis not present

## 2016-05-10 DIAGNOSIS — M5412 Radiculopathy, cervical region: Secondary | ICD-10-CM

## 2016-05-10 DIAGNOSIS — M542 Cervicalgia: Secondary | ICD-10-CM | POA: Diagnosis not present

## 2016-05-10 NOTE — Progress Notes (Signed)
Christina Booker - 76 y.o. female MRN [id number]  Date of birth: 1940/06/08  Office Visit Note: Visit Date: 05/10/2016 PCP: Gilford Rile, MD Referred by: Raina Mina., MD  Subjective: Chief Complaint  Patient presents with  . Neck - Pain   HPI: [name] is a 76 year old female that we've seen in the past to Dr. Lorin Mercy for her lumbar spine which continues to be an issue. Since I have last seen her she has undergone lumbar fusion at L4-5. However she comes in today complaining of  bilateral arm pain that has been going on for 1.5 years and she recently saw neurologist they did an MRI of her neck.  We have the cervical MRI on CD and also can be pulled up on the regular canopy software. This is reviewed with the patient reviewed below. She did not find a neurologist very helpful so didn't seek evaluation and treatment in our office. She saw Benjiman Core, PA and then Dondra Prader, [place]. He was then ultimately referred to [place] for possible treatment. Patient states neck pain and left greater than right upper extremity painful radicular pain and numbness tingling burning 2-3 months. This is on the posterior side of the left arm and in the axilla consistent with a T1 symptoms. This is been progressively worsening. Over the last year she's had multiple prednisone tapers for neck and low back symptoms. She is followed by a neurologist in [place] gave a prednisone taper last month that did not give any improvement.. Nothing seems to help pain in arms. Occasionally, she denies numbness and tingling. She however finds it very problematic on the left side she has to move her arm around because of the symptoms. She is not felt like there is been any specific weakness. She is right-hand dominant. She has not had any electrodiagnostic studies. She has had some therapy in the past. Unfortunately her back pain is also undergoing an as problematic. She currently takes hydrocodone and tramadol at times.      Review of Systems  Constitutional: Negative for chills, fever, malaise/fatigue and weight loss.  HENT: Negative for hearing loss and sinus pain.   Eyes: Negative for blurred vision, double vision and photophobia.  Respiratory: Negative for cough and shortness of breath.   Cardiovascular: Negative for chest pain, palpitations and leg swelling.  Gastrointestinal: Negative for abdominal pain, nausea and vomiting.  Genitourinary: Negative for flank pain.  Musculoskeletal: Positive for back pain and neck pain. Negative for myalgias.  Skin: Negative for itching and rash.  Neurological: Negative for tremors, focal weakness and weakness.  Endo/Heme/Allergies: Negative.   Psychiatric/Behavioral: Negative for depression.  All other systems reviewed and are negative.  Otherwise per HPI.  Assessment & Plan: Visit Diagnoses:  1. Cervical radiculopathy   2. Spinal stenosis of cervical region   3. Cervicalgia     Plan: Findings:  Chronic several year history of neck and arm pain particularly on the left posterior arm and across to the axilla and more of a T1 distribution. This is gotten progressively worse on the left side despite management with opioid medication and prednisone on different occasions as well as some therapy. She now has an MRI that was reviewed below in the notes but basically shows pretty significant stenosis at C7-T1 which is multifactorial but there is significant ligamentum flavum thickening compared to the other levels. The report also notes some myelomalacia in this area as well. Despite these findings are physical exam is fairly benign. I do think  she be a good candidate for cervical injection. We likely would have to do the injection at either C6-7 which there is plenty of room or maybe a T1-2 low this level. She also is at a point where she probably is going to need to consider surgery in terms of the myelomalacia at its present in the increased risk of central cord syndrome  with even mild trauma. Unfortunately she is also being worked up for her lumbar spine as well as she's had a prior fusion here. There are trigger points on exam but I think these are more of a symptom of the underlying problem. We will try to get her approved and in for an injection. Unfortunately my fluoroscopy unit is down right now but hopefully we'll get this back on board so we can get her an injection pretty quickly. She'll continue to follow up with Dr. Lorin Mercy however. I spent more than 25 minutes speaking face-to-face with the patient with 50% of the time in counseling.    Meds & Orders: No orders of the defined types were placed in this encounter.  No orders of the defined types were placed in this encounter.   Follow-up: Return for Left C7-T1 interlaminar epidural steroid injection.   Procedures: No procedures performed  No notes on file   Clinical History: Cervical MRI 04/22/2016 Bulky ligamentum flavum thickening and a disc bulge at C7-T1 efface CSF about the cord. Small focus of myelomalacia within the cord is likely related to spondylosis.  Moderate to moderately severe foraminal narrowing at C3-4 is worse on the right.  Mild deformity of the right hemicord at C4-5 due to disc.  Flattening of the ventral cord and severe left and moderately severe right foraminal narrowing C5-6.  She reports that she quit smoking about 24 years ago. She has never used smokeless tobacco. No results for input(s): HGBA1C, LABURIC in the last 8760 hours.  Objective:  VS:  HT:    WT:   BMI:     BP:134/83  HR:69bpm  TEMP: ( )  RESP:  Physical Exam  Constitutional: She is oriented to person, place, and time. She appears well-developed and well-nourished. No distress.  HENT:  Head: Normocephalic and atraumatic.  Nose: Nose normal.  Mouth/Throat: Oropharynx is clear and moist.  Eyes: Conjunctivae and EOM are normal. Pupils are equal, round, and reactive to light.  Neck: Neck supple.  She  has limited range of motion with rotation and forward flexion. She has a negative Spurling's test bilaterally.  Cardiovascular: Normal rate, regular rhythm and intact distal pulses.   Pulmonary/Chest: Effort normal and breath sounds normal.  Abdominal: She exhibits no distension.  Musculoskeletal:  Again cervical range of motion is limited in ranges with rotation and extension and flexion. She does have some positive trigger points in the levator scapula as well as supraspinatus and trapezius. She has no impingement signs of the shoulders. She actually has intact symmetric strength bilaterally with shoulder abduction elbow flexion and wrist extension long finger flexion abduction of the fingers as well as wrist extension. She has equivocal decreased sensation in the T1 dermatome on the left. She has a negative Spurling's test. She has a negative Hoffmann's test bilaterally. She has 2+ muscle stretch reflexes bilaterally at the brachioradialis and biceps.  Neurological: She is alert and oriented to person, place, and time. She displays normal reflexes. She exhibits normal muscle tone. Coordination normal.  Skin: Skin is warm and dry. No rash noted. No erythema.  Psychiatric: She  has a normal mood and affect. Her behavior is normal.  Nursing note and vitals reviewed.   Ortho Exam Imaging: No results found.  Past Medical/Family/Surgical/Social History: Medications & Allergies reviewed per EMR Patient Active Problem List   Diagnosis Date Noted  . Cervical radiculopathy 05/12/2016  . Spinal stenosis of cervical region 05/12/2016  . Cervicalgia 05/12/2016  . Ganglion of left hand 12/22/2014  . S/P lumbar fusion 04/16/2014   Past Medical History:  Diagnosis Date  . Arthritis    hands, back  . Hypertension   . Irritable bowel syndrome   . Rosacea   . Stroke [facility]) 12/01/2009   tia   Family History  Problem Relation Age of Onset  . Cancer Mother   . Uterine cancer Mother   . Hypertension  Mother   . Heart attack Father    Past Surgical History:  Procedure Laterality Date  . ABDOMINAL HYSTERECTOMY    . BACK SURGERY    . CARPAL TUNNEL RELEASE Right 2009  . CARPAL TUNNEL RELEASE Left   . GANGLION CYST EXCISION Left 12/22/2014   Procedure: Excision Left Hand Ganglion cyst;  Surgeon: Marybelle Killings, MD;  Location: [place];  Service: Orthopedics;  Laterality: Left;  . HEMORRHOID SURGERY    . TUBAL LIGATION     Social History   Occupational History  . Not on file.   Social History Main Topics  . Smoking status: Former Smoker    Quit date: 04/09/1992  . Smokeless tobacco: Never Used  . Alcohol use 2.4 oz/week    4 Glasses of wine per week     Comment: 4  . Drug use: No  . Sexual activity: Not on file

## 2016-05-12 ENCOUNTER — Encounter (INDEPENDENT_AMBULATORY_CARE_PROVIDER_SITE_OTHER): Payer: Self-pay | Admitting: Physical Medicine and Rehabilitation

## 2016-05-12 DIAGNOSIS — M4802 Spinal stenosis, cervical region: Secondary | ICD-10-CM | POA: Insufficient documentation

## 2016-05-12 DIAGNOSIS — M542 Cervicalgia: Secondary | ICD-10-CM | POA: Insufficient documentation

## 2016-05-12 DIAGNOSIS — M5412 Radiculopathy, cervical region: Secondary | ICD-10-CM | POA: Insufficient documentation

## 2016-05-12 NOTE — Telephone Encounter (Signed)
Faxed auth form with office notes to [facility]

## 2016-05-13 NOTE — Telephone Encounter (Signed)
Received auth from [id number]. Eff 05/12/16-08/10/16. Called pt and scheduled her for 05/20/16 @ 10:15 w/driver.

## 2016-05-19 DIAGNOSIS — M48061 Spinal stenosis, lumbar region without neurogenic claudication: Secondary | ICD-10-CM | POA: Diagnosis not present

## 2016-05-19 DIAGNOSIS — M545 Low back pain: Secondary | ICD-10-CM | POA: Diagnosis not present

## 2016-05-20 ENCOUNTER — Ambulatory Visit (INDEPENDENT_AMBULATORY_CARE_PROVIDER_SITE_OTHER): Payer: PPO | Admitting: Physical Medicine and Rehabilitation

## 2016-05-20 ENCOUNTER — Ambulatory Visit (INDEPENDENT_AMBULATORY_CARE_PROVIDER_SITE_OTHER): Payer: Self-pay

## 2016-05-20 ENCOUNTER — Encounter (INDEPENDENT_AMBULATORY_CARE_PROVIDER_SITE_OTHER): Payer: Self-pay | Admitting: Physical Medicine and Rehabilitation

## 2016-05-20 VITALS — HR 71 | Temp 98.0°F

## 2016-05-20 DIAGNOSIS — M5412 Radiculopathy, cervical region: Secondary | ICD-10-CM | POA: Diagnosis not present

## 2016-05-20 MED ORDER — LIDOCAINE HCL (PF) 1 % IJ SOLN
0.3300 mL | Freq: Once | INTRAMUSCULAR | Status: AC
  Administered 2016-05-20: 0.3 mL

## 2016-05-20 MED ORDER — METHYLPREDNISOLONE ACETATE 80 MG/ML IJ SUSP
80.0000 mg | Freq: Once | INTRAMUSCULAR | Status: AC
  Administered 2016-05-20: 80 mg

## 2016-05-20 NOTE — Progress Notes (Signed)
SHERRI MCARTHY - 76 y.o. female MRN [id number]  Date of birth: December 18, 1940  Office Visit Note: Visit Date: 05/20/2016 PCP: Gilford Rile, MD Referred by: Raina Mina., MD  Subjective: Chief Complaint  Patient presents with  . Neck - Pain   HPI: Mrs. Arsenio Loader 32 is a 76 year old female that we seen in the past for her lumbar complaints. I saw her recently for evaluation and management of her neck and shoulder pain left more than right. Patient is here today for planned left C7-T1 interlaminar injection. No change in symptoms. Please see our prior evaluation and management note for further details and justification. She does have significant stenosis at C7-T1. We are going to complete a C6-C7 epidural injection. I did review the MRI imaging in this is a safe area to do this. If she doesn't get much relief she would need follow-up with Dr. Lorin Mercy for surgical consideration given the level of findings C7-T1.    ROS Otherwise per HPI.  Assessment & Plan: Visit Diagnoses:  1. Cervical radiculopathy     Plan: Findings:  Left C6"-7 interlaminar epidural steroid injection.    Meds & Orders:  Meds ordered this encounter  Medications  . lidocaine (PF) (XYLOCAINE) 1 % injection 0.3 mL  . methylPREDNISolone acetate (DEPO-MEDROL) injection 80 mg    Orders Placed This Encounter  Procedures  . XR C-ARM NO REPORT  . Epidural Steroid injection    Follow-up: Return for scheduled follow up with Dr. Lorin Mercy.   Procedures: No procedures performed  No notes on file   Clinical History: Cervical MRI 04/22/2016 Bulky ligamentum flavum thickening and a disc bulge at C7-T1 efface CSF about the cord. Small focus of myelomalacia within the cord is likely related to spondylosis.  Moderate to moderately severe foraminal narrowing at C3-4 is worse on the right.  Mild deformity of the right hemicord at C4-5 due to disc.  Flattening of the ventral cord and severe left and moderately severe right  foraminal narrowing C5-6.  She reports that she quit smoking about 24 years ago. She has never used smokeless tobacco. No results for input(s): HGBA1C, LABURIC in the last 8760 hours.  Objective:  VS:  HT:    WT:   BMI:     BP:   HR:71bpm  TEMP:98 F (36.7 C)( )  RESP:96 % Physical Exam  Musculoskeletal:  Patient has good strength in the upper extremities bilaterally with 5 out of 5 strength. Wrist extension long finger flexion and finger abduction.    Ortho Exam Imaging: No results found.  Past Medical/Family/Surgical/Social History: Medications & Allergies reviewed per EMR Patient Active Problem List   Diagnosis Date Noted  . Cervical radiculopathy 05/12/2016  . Spinal stenosis of cervical region 05/12/2016  . Cervicalgia 05/12/2016  . Ganglion of left hand 12/22/2014  . S/P lumbar fusion 04/16/2014   Past Medical History:  Diagnosis Date  . Arthritis    hands, back  . Hypertension   . Irritable bowel syndrome   . Rosacea   . Stroke [facility]) 12/01/2009   tia   Family History  Problem Relation Age of Onset  . Cancer Mother   . Uterine cancer Mother   . Hypertension Mother   . Heart attack Father    Past Surgical History:  Procedure Laterality Date  . ABDOMINAL HYSTERECTOMY    . BACK SURGERY    . CARPAL TUNNEL RELEASE Right 2009  . CARPAL TUNNEL RELEASE Left   . GANGLION CYST EXCISION Left 12/22/2014  Procedure: Excision Left Hand Ganglion cyst;  Surgeon: Marybelle Killings, MD;  Location: [place];  Service: Orthopedics;  Laterality: Left;  . HEMORRHOID SURGERY    . TUBAL LIGATION     Social History   Occupational History  . Not on file.   Social History Main Topics  . Smoking status: Former Smoker    Quit date: 04/09/1992  . Smokeless tobacco: Never Used  . Alcohol use 2.4 oz/week    4 Glasses of wine per week     Comment: 4  . Drug use: No  . Sexual activity: Not on file

## 2016-05-20 NOTE — Patient Instructions (Signed)

## 2016-05-24 DIAGNOSIS — R1084 Generalized abdominal pain: Secondary | ICD-10-CM | POA: Diagnosis not present

## 2016-05-27 DIAGNOSIS — R1013 Epigastric pain: Secondary | ICD-10-CM | POA: Diagnosis not present

## 2016-05-27 DIAGNOSIS — K2960 Other gastritis without bleeding: Secondary | ICD-10-CM | POA: Diagnosis not present

## 2016-05-27 DIAGNOSIS — K3189 Other diseases of stomach and duodenum: Secondary | ICD-10-CM | POA: Diagnosis not present

## 2016-05-28 DIAGNOSIS — R1084 Generalized abdominal pain: Secondary | ICD-10-CM | POA: Diagnosis not present

## 2016-05-28 DIAGNOSIS — D1771 Benign lipomatous neoplasm of kidney: Secondary | ICD-10-CM | POA: Diagnosis not present

## 2016-05-28 DIAGNOSIS — K8020 Calculus of gallbladder without cholecystitis without obstruction: Secondary | ICD-10-CM | POA: Diagnosis not present

## 2016-05-31 DIAGNOSIS — R109 Unspecified abdominal pain: Secondary | ICD-10-CM | POA: Diagnosis not present

## 2016-05-31 DIAGNOSIS — K8020 Calculus of gallbladder without cholecystitis without obstruction: Secondary | ICD-10-CM | POA: Diagnosis not present

## 2016-06-01 ENCOUNTER — Ambulatory Visit (INDEPENDENT_AMBULATORY_CARE_PROVIDER_SITE_OTHER): Payer: PPO | Admitting: Orthopaedic Surgery

## 2016-06-01 ENCOUNTER — Encounter (INDEPENDENT_AMBULATORY_CARE_PROVIDER_SITE_OTHER): Payer: Self-pay | Admitting: Orthopaedic Surgery

## 2016-06-01 VITALS — BP 116/74 | HR 69

## 2016-06-01 DIAGNOSIS — M4802 Spinal stenosis, cervical region: Secondary | ICD-10-CM | POA: Diagnosis not present

## 2016-06-01 DIAGNOSIS — M48062 Spinal stenosis, lumbar region with neurogenic claudication: Secondary | ICD-10-CM | POA: Insufficient documentation

## 2016-06-01 DIAGNOSIS — M48061 Spinal stenosis, lumbar region without neurogenic claudication: Secondary | ICD-10-CM | POA: Diagnosis not present

## 2016-06-01 NOTE — Progress Notes (Signed)
Office Visit Note   Patient: Christina Booker           Date of Birth: [birth year]           MRN: [id number] Visit Date: 06/01/2016              Requested by: Raina Mina, MD [address] [place], [place] [address] PCP: Gilford Rile, MD   Assessment & Plan: Visit Diagnoses:  1. Spinal stenosis of cervical region   2. Spinal stenosis of lumbar region without neurogenic claudication           at L3-4 level above solid L4-5 decompression fusion from 2015.  Plan: We'll proceed with lumbar epidural injection likely facet injections at L3-4 versus epidural with Dr. Ernestina Patches. I'll follow her up in the 2 months.  Patient's had the symptoms related to the cervical spine for about 2 years. I discussed with her that if she has progression of symptoms or increased symptoms then decompression and fusion would be required. We discussed anterior versus posterior. She has short neck, more thickening of the ligaments posteriorly and likely posterior fusion would be required with lateral mass screws. I reviewed with her preoperative lumbar MRI scan. Husband was with her today we also reviewed the MRI scan cervical as well as recent lumbar MRI scan images and gave him copy of reports. We discussed complex problems she has with some short pedicles in both cervical spine and lumbar spine, the cord myelomalacia. We discussed the adjacent level progression at L3-4 and possibility of needing an extension of the fusion if the cyst enlarges more and she develops lumbar neurogenic claudication symptoms from the stenosis at L3-4. We discussed adjacent changes at L2-3 which is not normal with some moderate narrowing and potential for this later be a problem. Extensive discussion about the adjacent to the level progression. Follow-up 2 months. Follow-Up Instructions: No Follow-up on file.   Orders:  No orders of the defined types were placed in this encounter.  No orders of the defined types were placed in this  encounter.     Procedures: No procedures performed   Clinical Data: No additional findings.   Subjective: Chief Complaint  Patient presents with  . Neck - Pain, Follow-up  . Lower Back - Pain, Follow-up    Patient comes in for a follow up cervicalgia and lumbar radiculopathy. She saw Dondra Prader on 05/04/16. MRI was ordered by Jeneen Rinks. MRI was done in [place]. Injection with Dr Ernestina Patches was given  05/20/16. Patient states it did not help at all. She is still having pain.     Review of Systems 14 point review of systems unchanged from 04/27/2016. She's had numbness in the left arm for about 2 years in a portion of the arm between the elbow and shoulder. She's having some of the symptoms now on the opposite right side. Arms bother her more than her back or legs. She has some back pain and discomfort with activities but does not have claudication at this time.   Objective: Vital Signs: BP 116/74   Pulse 69   Physical Exam  Constitutional: She is oriented to person, place, and time. She appears well-developed.  HENT:  Head: Normocephalic.  Right Ear: External ear normal.  Left Ear: External ear normal.  Eyes: Pupils are equal, round, and reactive to light.  Neck: No tracheal deviation present. No thyromegaly present.  Cardiovascular: Normal rate.   Pulmonary/Chest: Effort normal.  Abdominal: Soft.  Musculoskeletal:  Open lower  extremity reflexes are 1+ and symmetrical. Interossei per fundi are strong. No extensor weakness no sensory deficit C6 dermatome. Decreased sensation upper inner arm left more so than right. Deltoids biceps triceps are strong.  Neurological: She is alert and oriented to person, place, and time.  Skin: Skin is warm and dry.  Psychiatric: She has a normal mood and affect. Her behavior is normal.    Ortho Exam patient is intact pulses EHL anterior tib and gastrocsoleus is strong. Well-healed lumbar incision. No sensory deficit lower extremities. Some  discomfort lumbar forward bending. She is comfortable sitting position.  Specialty Comments:  No specialty comments available.  Imaging: Cervical MRI 04/22/2016 shows bulky ligamentum flavum anterolisthesis degenerative in nature at C7-T1 with effacement of the CSF around the cord. Focal myelomalacia of the cord likely related to spondylosis. She also has flattening of the cord at C5-6 with severe left and moderate right foraminal narrowing.  Lumbar MRI 05/19/2016 shows satisfactory decompression post fusion L4-5 with resolution of spinal stenosis. She has adjacent segment disease with now significant increase facet fluid with trace anterolisthesis at L3-4. Degenerative synovial cyst septate millimeters with moderate to severe stenosis right greater than left lateral recess stenosis. She has some changes as well at L2-3. L5-S1 level looks good without compression.   PMFS History: Patient Active Problem List   Diagnosis Date Noted  . Spinal stenosis of lumbar region without neurogenic claudication 06/01/2016  . Cervical radiculopathy 05/12/2016  . Spinal stenosis of cervical region 05/12/2016  . Cervicalgia 05/12/2016  . Ganglion of left hand 12/22/2014  . S/P lumbar fusion 04/16/2014   Past Medical History:  Diagnosis Date  . Arthritis    hands, back  . Hypertension   . Irritable bowel syndrome   . Rosacea   . Stroke [facility]) 12/01/2009   tia    Family History  Problem Relation Age of Onset  . Cancer Mother   . Uterine cancer Mother   . Hypertension Mother   . Heart attack Father     Past Surgical History:  Procedure Laterality Date  . ABDOMINAL HYSTERECTOMY    . BACK SURGERY    . CARPAL TUNNEL RELEASE Right 2009  . CARPAL TUNNEL RELEASE Left   . GANGLION CYST EXCISION Left 12/22/2014   Procedure: Excision Left Hand Ganglion cyst;  Surgeon: Marybelle Killings, MD;  Location: [place];  Service: Orthopedics;  Laterality: Left;  . HEMORRHOID SURGERY    . TUBAL  LIGATION     Social History   Occupational History  . Not on file.   Social History Main Topics  . Smoking status: Former Smoker    Quit date: 04/09/1992  . Smokeless tobacco: Never Used  . Alcohol use 2.4 oz/week    4 Glasses of wine per week     Comment: 4  . Drug use: No  . Sexual activity: Not on file

## 2016-06-01 NOTE — Addendum Note (Signed)
Addended by: Meyer Cory on: 06/01/2016 11:43 AM   Modules accepted: Orders

## 2016-06-20 ENCOUNTER — Ambulatory Visit (INDEPENDENT_AMBULATORY_CARE_PROVIDER_SITE_OTHER): Payer: Self-pay

## 2016-06-20 ENCOUNTER — Ambulatory Visit (INDEPENDENT_AMBULATORY_CARE_PROVIDER_SITE_OTHER): Payer: PPO | Admitting: Physical Medicine and Rehabilitation

## 2016-06-20 ENCOUNTER — Telehealth (INDEPENDENT_AMBULATORY_CARE_PROVIDER_SITE_OTHER): Payer: Self-pay | Admitting: Physical Medicine and Rehabilitation

## 2016-06-20 ENCOUNTER — Encounter (INDEPENDENT_AMBULATORY_CARE_PROVIDER_SITE_OTHER): Payer: Self-pay | Admitting: Physical Medicine and Rehabilitation

## 2016-06-20 VITALS — BP 145/84 | HR 66 | Temp 97.7°F

## 2016-06-20 DIAGNOSIS — M961 Postlaminectomy syndrome, not elsewhere classified: Secondary | ICD-10-CM

## 2016-06-20 DIAGNOSIS — M47816 Spondylosis without myelopathy or radiculopathy, lumbar region: Secondary | ICD-10-CM

## 2016-06-20 MED ORDER — LIDOCAINE HCL (PF) 1 % IJ SOLN
0.3300 mL | Freq: Once | INTRAMUSCULAR | Status: AC
  Administered 2016-06-20: 0.3 mL

## 2016-06-20 MED ORDER — METHYLPREDNISOLONE ACETATE 80 MG/ML IJ SUSP
80.0000 mg | Freq: Once | INTRAMUSCULAR | Status: AC
  Administered 2016-06-20: 80 mg

## 2016-06-20 NOTE — Progress Notes (Deleted)
   Office Visit Note   Patient: Christina Booker           Date of Birth: 03-19-1940           MRN: [id number] Visit Date: 06/20/2016              Requested by: Raina Mina, MD [address] [place], [place] [address] PCP: Gilford Rile, MD   Assessment & Plan: Visit Diagnoses: No diagnosis found.  Plan: ***  Follow-Up Instructions: No Follow-up on file.   Orders:  No orders of the defined types were placed in this encounter.  No orders of the defined types were placed in this encounter.     Procedures: No procedures performed   Clinical Data: No additional findings.   Subjective: Chief Complaint  Patient presents with  . Lower Back - Pain    Pt here for injection on right side lower back hurts more the groin and right side thigh, pain scale 4-6/10, especially when walking, or standing to long. Right thigh gets numb. Has driver, no contrast allergy, 81mg  aspirin.    Review of Systems   Objective: Vital Signs: There were no vitals taken for this visit.  Physical Exam  Ortho Exam  Specialty Comments:  No specialty comments available.  Imaging: No results found.   PMFS History: Patient Active Problem List   Diagnosis Date Noted  . Spinal stenosis of lumbar region without neurogenic claudication 06/01/2016  . Cervical radiculopathy 05/12/2016  . Spinal stenosis of cervical region 05/12/2016  . Cervicalgia 05/12/2016  . Ganglion of left hand 12/22/2014  . S/P lumbar fusion 04/16/2014   Past Medical History:  Diagnosis Date  . Arthritis    hands, back  . Hypertension   . Irritable bowel syndrome   . Rosacea   . Stroke [facility]) 12/01/2009   tia    Family History  Problem Relation Age of Onset  . Cancer Mother   . Uterine cancer Mother   . Hypertension Mother   . Heart attack Father     Past Surgical History:  Procedure Laterality Date  . ABDOMINAL HYSTERECTOMY    . BACK SURGERY    . CARPAL TUNNEL RELEASE Right 2009  . CARPAL TUNNEL  RELEASE Left   . GANGLION CYST EXCISION Left 12/22/2014   Procedure: Excision Left Hand Ganglion cyst;  Surgeon: Marybelle Killings, MD;  Location: [place];  Service: Orthopedics;  Laterality: Left;  . HEMORRHOID SURGERY    . TUBAL LIGATION     Social History   Occupational History  . Not on file.   Social History Main Topics  . Smoking status: Former Smoker    Quit date: 04/09/1992  . Smokeless tobacco: Never Used  . Alcohol use 2.4 oz/week    4 Glasses of wine per week     Comment: 4  . Drug use: No  . Sexual activity: Not on file

## 2016-06-20 NOTE — Patient Instructions (Signed)

## 2016-06-22 NOTE — Procedures (Signed)
Lumbar Facet Joint Intra-Articular Injection(s) with Fluoroscopic Guidance  Patient: Christina Booker      Date of Birth: [birth year] MRN: [id number] PCP: Gilford Rile, MD      Visit Date: 06/20/2016   Universal Protocol:    Date/Time: 04/11/188:34 AM  Consent Given By: the patient  Position: PRONE   Additional Comments: Vital signs were monitored before and after the procedure. Patient was prepped and draped in the usual sterile fashion. The correct patient, procedure, and site was verified.   Injection Procedure Details:  Procedure Site One Meds Administered:  Meds ordered this encounter  Medications  . lidocaine (PF) (XYLOCAINE) 1 % injection 0.3 mL  . methylPREDNISolone acetate (DEPO-MEDROL) injection 80 mg     Laterality: Right  Location/Site:  L3-L4  Needle size: 22 guage  Needle type: Spinal  Needle Placement: Articular  Findings:  -Contrast Used: 1 mL iohexol 180 mg iodine/mL   -Comments: Excellent flow of contrast producing a partial arthrogram.  Procedure Details: The fluoroscope beam is vertically oriented in AP, and the inferior recess is visualized beneath the lower pole of the inferior apophyseal process, which represents the target point for needle insertion. When direct visualization is difficult the target point is located at the medial projection of the vertebral pedicle. The region overlying each aforementioned target is locally anesthetized with a 1 to 2 ml. volume of 1% Lidocaine without Epinephrine.   The spinal needle was inserted into each of the above mentioned facet joints using biplanar fluoroscopic guidance. A 0.25 to 0.5 ml. volume of Isovue-250 was injected and a partial facet joint arthrogram was obtained. A single spot film was obtained of the resulting arthrogram.    One to 1.25 ml of the steroid/anesthetic solution was then injected into each of the facet joints noted above.   Additional Comments:  The patient tolerated the procedure  well Dressing: Band-Aid    Post-procedure details: Patient was observed during the procedure. Post-procedure instructions were reviewed.  Patient left the clinic in stable condition.

## 2016-06-22 NOTE — Progress Notes (Signed)
Christina Booker - 76 y.o. female MRN [id number]  Date of birth: 1940/06/27  Office Visit Note: Visit Date: 06/20/2016 PCP: Gilford Rile, MD Referred by: Raina Mina., MD  Subjective: Chief Complaint  Patient presents with  . Lower Back - Pain   HPI: Mrs. Christina Booker is a pleasant 76 year old female that comes in today for Facet joint block above her fusion. She has an MRI showing adjacent level disease at L3-for with facet joint cyst particularly on the right. She is experiencing right low back and pain referred to the groin and hip area. She does feel like there is a little bit of numbness in the leg.  The complete a diagnostic of therapeutic right L3-for facet joint block. Otherwise she still complaining of neck and arm pain particularly in the T1 distribution. She does have significant cervical disease at C7-T1. Dr. Lorin Mercy basically told her he would need to do a cervical fusion on his last note. Talking with her today she acted like nothing was said to her but then once older with the notes that she said yes we did discuss this. I told her that we could try one more injection we did try to inject above the C7-T1 level but we could try to go just below it and see if she gets some relief. I'll have her come back for that injection.    ROS Otherwise per HPI.  Assessment & Plan: Visit Diagnoses:  1. Spondylosis without myelopathy or radiculopathy, lumbar region   2. Post laminectomy syndrome     Plan: Findings:  Right L3-for facet joint block.    Meds & Orders:  Meds ordered this encounter  Medications  . lidocaine (PF) (XYLOCAINE) 1 % injection 0.3 mL  . methylPREDNISolone acetate (DEPO-MEDROL) injection 80 mg    Orders Placed This Encounter  Procedures  . Facet Injection  . XR C-ARM NO REPORT    Follow-up: Return for T1-2 interlam esi.   Procedures: No procedures performed  Lumbar Facet Joint Intra-Articular Injection(s) with Fluoroscopic Guidance  Patient: Christina Booker      Date of Birth: November 03, 1940 MRN: [id number] PCP: Gilford Rile, MD      Visit Date: 06/20/2016   Universal Protocol:    Date/Time: 04/11/188:34 AM  Consent Given By: the patient  Position: PRONE   Additional Comments: Vital signs were monitored before and after the procedure. Patient was prepped and draped in the usual sterile fashion. The correct patient, procedure, and site was verified.   Injection Procedure Details:  Procedure Site One Meds Administered:  Meds ordered this encounter  Medications  . lidocaine (PF) (XYLOCAINE) 1 % injection 0.3 mL  . methylPREDNISolone acetate (DEPO-MEDROL) injection 80 mg     Laterality: Right  Location/Site:  L3-L4  Needle size: 22 guage  Needle type: Spinal  Needle Placement: Articular  Findings:  -Contrast Used: 1 mL iohexol 180 mg iodine/mL   -Comments: Excellent flow of contrast producing a partial arthrogram.  Procedure Details: The fluoroscope beam is vertically oriented in AP, and the inferior recess is visualized beneath the lower pole of the inferior apophyseal process, which represents the target point for needle insertion. When direct visualization is difficult the target point is located at the medial projection of the vertebral pedicle. The region overlying each aforementioned target is locally anesthetized with a 1 to 2 ml. volume of 1% Lidocaine without Epinephrine.   The spinal needle was inserted into each of the above mentioned facet joints using biplanar fluoroscopic guidance. A  0.25 to 0.5 ml. volume of Isovue-250 was injected and a partial facet joint arthrogram was obtained. A single spot film was obtained of the resulting arthrogram.    One to 1.25 ml of the steroid/anesthetic solution was then injected into each of the facet joints noted above.   Additional Comments:  The patient tolerated the procedure well Dressing: Band-Aid    Post-procedure details: Patient was observed during the  procedure. Post-procedure instructions were reviewed.  Patient left the clinic in stable condition.     Clinical History: Cervical MRI 04/22/2016 Bulky ligamentum flavum thickening and a disc bulge at C7-T1 efface CSF about the cord. Small focus of myelomalacia within the cord is likely related to spondylosis.  Moderate to moderately severe foraminal narrowing at C3-4 is worse on the right.  Mild deformity of the right hemicord at C4-5 due to disc.  Flattening of the ventral cord and severe left and moderately severe right foraminal narrowing C5-6.  She reports that she quit smoking about 24 years ago. She has never used smokeless tobacco. No results for input(s): HGBA1C, LABURIC in the last 8760 hours.  Objective:  VS:  HT:    WT:   BMI:     BP:(!) 145/84  HR:66bpm  TEMP:97.7 F (36.5 C)(Oral)  RESP:95 % Physical Exam  Musculoskeletal:  Patient ambulates without aid with a very stiff lumbar spine she does have pain with extension and she has good distal strength.    Ortho Exam Imaging: No results found.  Past Medical/Family/Surgical/Social History: Medications & Allergies reviewed per EMR Patient Active Problem List   Diagnosis Date Noted  . Spinal stenosis of lumbar region without neurogenic claudication 06/01/2016  . Cervical radiculopathy 05/12/2016  . Spinal stenosis of cervical region 05/12/2016  . Cervicalgia 05/12/2016  . Ganglion of left hand 12/22/2014  . S/P lumbar fusion 04/16/2014   Past Medical History:  Diagnosis Date  . Arthritis    hands, back  . Hypertension   . Irritable bowel syndrome   . Rosacea   . Stroke [facility]) 12/01/2009   tia   Family History  Problem Relation Age of Onset  . Cancer Mother   . Uterine cancer Mother   . Hypertension Mother   . Heart attack Father    Past Surgical History:  Procedure Laterality Date  . ABDOMINAL HYSTERECTOMY    . BACK SURGERY    . CARPAL TUNNEL RELEASE Right 2009  . CARPAL TUNNEL RELEASE Left     . GANGLION CYST EXCISION Left 12/22/2014   Procedure: Excision Left Hand Ganglion cyst;  Surgeon: Marybelle Killings, MD;  Location: [place];  Service: Orthopedics;  Laterality: Left;  . HEMORRHOID SURGERY    . TUBAL LIGATION     Social History   Occupational History  . Not on file.   Social History Main Topics  . Smoking status: Former Smoker    Quit date: 04/09/1992  . Smokeless tobacco: Never Used  . Alcohol use 2.4 oz/week    4 Glasses of wine per week     Comment: 4  . Drug use: No  . Sexual activity: Not on file

## 2016-06-22 NOTE — Telephone Encounter (Signed)
Faxed auth form with notes to [facility]

## 2016-06-27 NOTE — Telephone Encounter (Signed)
auth received. Auth [id number]

## 2016-06-30 ENCOUNTER — Ambulatory Visit (INDEPENDENT_AMBULATORY_CARE_PROVIDER_SITE_OTHER): Payer: PPO | Admitting: Physical Medicine and Rehabilitation

## 2016-06-30 ENCOUNTER — Encounter (INDEPENDENT_AMBULATORY_CARE_PROVIDER_SITE_OTHER): Payer: Self-pay | Admitting: Physical Medicine and Rehabilitation

## 2016-06-30 ENCOUNTER — Ambulatory Visit (INDEPENDENT_AMBULATORY_CARE_PROVIDER_SITE_OTHER): Payer: Self-pay

## 2016-06-30 VITALS — BP 149/83 | HR 62

## 2016-06-30 DIAGNOSIS — M4802 Spinal stenosis, cervical region: Secondary | ICD-10-CM

## 2016-06-30 DIAGNOSIS — M5413 Radiculopathy, cervicothoracic region: Secondary | ICD-10-CM

## 2016-06-30 MED ORDER — METHYLPREDNISOLONE ACETATE 80 MG/ML IJ SUSP
80.0000 mg | Freq: Once | INTRAMUSCULAR | Status: AC
  Administered 2016-06-30: 80 mg

## 2016-06-30 MED ORDER — LIDOCAINE HCL (PF) 1 % IJ SOLN
0.3300 mL | Freq: Once | INTRAMUSCULAR | Status: AC
  Administered 2016-06-30: 0.3 mL

## 2016-06-30 NOTE — Progress Notes (Deleted)
Patient is here today for planned T1-2 interlaminar injection. No change in symptoms.  States lower back is doing well since last injection.

## 2016-06-30 NOTE — Procedures (Signed)
Thoracic Epidural Steroid Injection - Interlaminar Approach with Fluoroscopic Guidance  Patient: Christina Booker      Date of Birth: October 12, 1940 MRN: [id number] PCP: Gilford Rile, MD      Visit Date: 06/30/2016   Universal Protocol:    Date/Time: 04/19/181:15 PM  Consent Given By: the patient  Position: PRONE  Additional Comments: Vital signs were monitored before and after the procedure. Patient was prepped and draped in the usual sterile fashion. The correct patient, procedure, and site was verified.   Injection Procedure Details:  Procedure Site One Meds Administered:  Meds ordered this encounter  Medications  . lidocaine (PF) (XYLOCAINE) 1 % injection 0.3 mL  . methylPREDNISolone acetate (DEPO-MEDROL) injection 80 mg     Laterality: Right  Location/Site:  T1-2  Needle size: 20 G  Needle type: Touhy  Needle Placement: Paramedian epidural space  Findings:  -Contrast Used: 1 mL iohexol 180 mg iodine/mL   -Comments: Excellent flow of contrast into the epidural space. With instillation of the injectate the patient did get quite a bit of pain throughout the anterior chest. This would be consistent with the T1 and T2 nerve root distribution. We did proceed slowly and able to put in about 3 mL of steroid solution. After the injection the patient was having some difficulty with pain in the anterior chest region. She was very anxious about this. We did try to reassure that the injection looked well placed and there were no issues with that. She was having no neurologic signs or symptoms. She did well with 20 minutes of monitoring and relaxation of the pain did diminish quite significantly. She is to call us if there is any questions.  Procedure Details: Using a paramedian approach from the side mentioned above, the region overlying the inferior lamina was localized under fluoroscopic visualization and the soft tissues overlying this structure were infiltrated with 4 ml. of 1%  Lidocaine without Epinephrine. A # 20 gauge, Tuohy needle was inserted into the epidural space using a paramedian approach.  The epidural space was localized using loss of resistance along with lateral and contralateral oblique bi-planar fluoroscopic views.  After negative aspirate for air, blood, and CSF, a 2 ml. volume of Isovue-250 was injected into the epidural space and the flow of contrast was observed. Radiographs were obtained for documentation purposes.   The injectate was administered into the level noted above.  Additional Comments:  The patient tolerated the procedure well Dressing: Band-Aid    Post-procedure details: Patient was observed during the procedure. Post-procedure instructions were reviewed.  Patient left the clinic in stable condition.

## 2016-06-30 NOTE — Patient Instructions (Signed)

## 2016-07-01 NOTE — Progress Notes (Signed)
Christina Booker - 76 y.o. female MRN [id number]  Date of birth: 1940-12-20  Office Visit Note: Visit Date: 06/30/2016 PCP: Gilford Rile, MD Referred by: Raina Mina., MD  Subjective: Chief Complaint  Patient presents with  . Middle Back - Pain   HPI: Christina Booker is a 76 year old female with chronic neck and back pain. Recently saw her for low back problems epidural injection that gave her quite a bit relief. We have recently completed the C6-7 interlaminar injection for her neck without much relief at all. She'll follow-up with Dr. Lorin Mercy who at that time talked about surgery and conservative care. We are today going to complete a T1-2 below her level of stenosis to see if she gets some relief. She doesn't get relief with this unfortunately she is probably going to be a surgical candidate. She gets pain in the neck radiating to the T1 distribution and C8 to the degree under her arms. The left is worse than right.    ROS Otherwise per HPI.  Assessment & Plan: Visit Diagnoses:  1. Radiculopathy of cervicothoracic region   2. Spinal stenosis of cervical region     Plan: Findings:  T1-T2 interlaminar epidural steroid injection completed with excellent visualization and excellent flow of contrast. During the injectate the patient was having increased pain in the anterior chest which would be consistent with a T1 or T2 nerve root irritation. We do see with normal intralaminar epidural steroid injections of the cervical spine some warmth or pain across the chest. Cause of her discomfort we put in 3 mL of the injectate very slowly. After the procedure she continued to have some pain in the chest was actually quite anxious about this. We did monitor her for a while the pain continued to be relieved after 20 minutes. She had no neurologic complaints or signs or symptoms. She will call us with any concerns.    Meds & Orders:  Meds ordered this encounter  Medications  . lidocaine (PF)  (XYLOCAINE) 1 % injection 0.3 mL  . methylPREDNISolone acetate (DEPO-MEDROL) injection 80 mg    Orders Placed This Encounter  Procedures  . XR C-ARM NO REPORT  . Epidural Steroid injection    Follow-up: Return if symptoms worsen or fail to improve, for Dr. Lorin Mercy.   Procedures: No procedures performed  Thoracic Epidural Steroid Injection - Interlaminar Approach with Fluoroscopic Guidance  Patient: Christina Booker      Date of Birth: 03-18-1940 MRN: [id number] PCP: Gilford Rile, MD      Visit Date: 06/30/2016   Universal Protocol:    Date/Time: 04/19/181:15 PM  Consent Given By: the patient  Position: PRONE  Additional Comments: Vital signs were monitored before and after the procedure. Patient was prepped and draped in the usual sterile fashion. The correct patient, procedure, and site was verified.   Injection Procedure Details:  Procedure Site One Meds Administered:  Meds ordered this encounter  Medications  . lidocaine (PF) (XYLOCAINE) 1 % injection 0.3 mL  . methylPREDNISolone acetate (DEPO-MEDROL) injection 80 mg     Laterality: Right  Location/Site:  T1-2  Needle size: 20 G  Needle type: Touhy  Needle Placement: Paramedian epidural space  Findings:  -Contrast Used: 1 mL iohexol 180 mg iodine/mL   -Comments: Excellent flow of contrast into the epidural space. With instillation of the injectate the patient did get quite a bit of pain throughout the anterior chest. This would be consistent with the T1 and T2 nerve root distribution. We did  proceed slowly and able to put in about 3 mL of steroid solution. After the injection the patient was having some difficulty with pain in the anterior chest region. She was very anxious about this. We did try to reassure that the injection looked well placed and there were no issues with that. She was having no neurologic signs or symptoms. She did well with 20 minutes of monitoring and relaxation of the pain did diminish  quite significantly. She is to call us if there is any questions.  Procedure Details: Using a paramedian approach from the side mentioned above, the region overlying the inferior lamina was localized under fluoroscopic visualization and the soft tissues overlying this structure were infiltrated with 4 ml. of 1% Lidocaine without Epinephrine. A # 20 gauge, Tuohy needle was inserted into the epidural space using a paramedian approach.  The epidural space was localized using loss of resistance along with lateral and contralateral oblique bi-planar fluoroscopic views.  After negative aspirate for air, blood, and CSF, a 2 ml. volume of Isovue-250 was injected into the epidural space and the flow of contrast was observed. Radiographs were obtained for documentation purposes.   The injectate was administered into the level noted above.  Additional Comments:  The patient tolerated the procedure well Dressing: Band-Aid    Post-procedure details: Patient was observed during the procedure. Post-procedure instructions were reviewed.  Patient left the clinic in stable condition.     Clinical History: Cervical MRI 04/22/2016 Bulky ligamentum flavum thickening and a disc bulge at C7-T1 efface CSF about the cord. Small focus of myelomalacia within the cord is likely related to spondylosis.  Moderate to moderately severe foraminal narrowing at C3-4 is worse on the right.  Mild deformity of the right hemicord at C4-5 due to disc.  Flattening of the ventral cord and severe left and moderately severe right foraminal narrowing C5-6.  She reports that she quit smoking about 24 years ago. She has never used smokeless tobacco. No results for input(s): HGBA1C, LABURIC in the last 8760 hours.  Objective:  VS:  HT:    WT:   BMI:     BP:(!) 149/83  HR:62bpm  TEMP: ( )  RESP:98 % Physical Exam  Musculoskeletal:  Forward flexed cervical spine pain with extension. Limited rotation bilaterally. Positive  tenderness over the trapezius musculature. Good upper extremity strength bilaterally.    Ortho Exam Imaging: Xr C-arm No Report  Result Date: 06/30/2016 Please see Notes or Procedures tab for imaging impression.   Past Medical/Family/Surgical/Social History: Medications & Allergies reviewed per EMR Patient Active Problem List   Diagnosis Date Noted  . Spinal stenosis of lumbar region without neurogenic claudication 06/01/2016  . Cervical radiculopathy 05/12/2016  . Spinal stenosis of cervical region 05/12/2016  . Cervicalgia 05/12/2016  . Ganglion of left hand 12/22/2014  . S/P lumbar fusion 04/16/2014   Past Medical History:  Diagnosis Date  . Arthritis    hands, back  . Hypertension   . Irritable bowel syndrome   . Rosacea   . Stroke [facility]) 12/01/2009   tia   Family History  Problem Relation Age of Onset  . Cancer Mother   . Uterine cancer Mother   . Hypertension Mother   . Heart attack Father    Past Surgical History:  Procedure Laterality Date  . ABDOMINAL HYSTERECTOMY    . BACK SURGERY    . CARPAL TUNNEL RELEASE Right 2009  . CARPAL TUNNEL RELEASE Left   . GANGLION CYST EXCISION Left  12/22/2014   Procedure: Excision Left Hand Ganglion cyst;  Surgeon: Marybelle Killings, MD;  Location: [facility];  Service: Orthopedics;  Laterality: Left;  . HEMORRHOID SURGERY    . TUBAL LIGATION     Social History   Occupational History  . Not on file.   Social History Main Topics  . Smoking status: Former Smoker    Quit date: 04/09/1992  . Smokeless tobacco: Never Used  . Alcohol use 2.4 oz/week    4 Glasses of wine per week     Comment: 4  . Drug use: No  . Sexual activity: Not on file

## 2016-07-12 ENCOUNTER — Telehealth (INDEPENDENT_AMBULATORY_CARE_PROVIDER_SITE_OTHER): Payer: Self-pay | Admitting: Orthopaedic Surgery

## 2016-07-12 NOTE — Telephone Encounter (Signed)
Returned call to patient left message for a return call to schedule appointment with Dr Lorin Mercy. [phone]

## 2016-08-16 ENCOUNTER — Other Ambulatory Visit (INDEPENDENT_AMBULATORY_CARE_PROVIDER_SITE_OTHER): Payer: Self-pay | Admitting: Orthopaedic Surgery

## 2016-08-16 ENCOUNTER — Encounter (INDEPENDENT_AMBULATORY_CARE_PROVIDER_SITE_OTHER): Payer: Self-pay | Admitting: Orthopedic Surgery

## 2016-08-16 ENCOUNTER — Ambulatory Visit (INDEPENDENT_AMBULATORY_CARE_PROVIDER_SITE_OTHER): Payer: PPO | Admitting: Orthopaedic Surgery

## 2016-08-16 ENCOUNTER — Encounter (INDEPENDENT_AMBULATORY_CARE_PROVIDER_SITE_OTHER): Payer: Self-pay | Admitting: Orthopaedic Surgery

## 2016-08-16 VITALS — BP 147/89 | HR 69

## 2016-08-16 DIAGNOSIS — M48061 Spinal stenosis, lumbar region without neurogenic claudication: Secondary | ICD-10-CM

## 2016-08-16 DIAGNOSIS — M4802 Spinal stenosis, cervical region: Secondary | ICD-10-CM

## 2016-08-16 NOTE — Progress Notes (Signed)
Office Visit Note   Patient: Christina Booker           Date of Birth: [birth year]           MRN: [id number] Visit Date: 08/16/2016              Requested by: Raina Mina., MD [place], [place] [address] PCP: Raina Mina., MD   Assessment & Plan: Visit Diagnoses:  1. Spinal stenosis of cervical region   2. Spinal stenosis of lumbar region without neurogenic claudication     Plan: We discussed the anterior cervical discectomy and fusion at C7-T1. X-rays since his able to be reached with position of her clavicle. We discussed the other levels with some spondylosis at C4-5 and also C5-6 which will not be surgically addressed at this time. Potential for need for posterior surgery if she did not heal anteriorly with bone grafting and plate fixation. Her adjacent lumbar degenerative problem above solid fusion will be deferred at this time. Cervical procedure for risks of dysphagia and dysphonia, thoracic duct problems, pseudoarthrosis, need for posterior fusion with instrumentation was discussed. She understands and requests we proceed.  Follow-Up Instructions: No Follow-up on file.   Orders:  No orders of the defined types were placed in this encounter.  No orders of the defined types were placed in this encounter.     Procedures: No procedures performed   Clinical Data: No additional findings.   Subjective: Chief Complaint  Patient presents with  . Neck - Pain  . Lower Back - Pain    HPI 76 year old female returns with ongoing problems with cervical spondylosis with anterolisthesis C7-T1 with focal cord myelomalacia. She has persistent upper extremity numbness and tingling in her upper extremities are radiated down to her hands. She's had symptoms for greater than one half years with disc bulge at C7-T1 and also some narrowing at C5-6. She denies any weakness in upper extremities just numbness and pain with cervical rotation. She also continues to have  problems with low back pain with spinal stenosis at the L3-4 level above a solid L4-5 decompression and fusion from 2015. She has lower extremity pain with walking and standing after 10 minutes. She's had epidural injections without relief in the lumbar spine. Pain radiates from her back into her groin and into her thigh and hip region. She gets some relief from the facet injections at L3-4 where she has increased facet fluid. Pain in her neck radiates into the C8 and T1 distribution area. Patient states that this is a neck pain is gradually progressed and she is ready for surgical intervention. Patient's had hydrocodone in the past she has problems with narcotic medicine with nausea. She's had anti-inflammatories, prednisone pack, tramadol, muscle relaxants without relief. Epidural injections both facet and the lumbar as well as C7-T1 epidural injection  Review of Systems 14 review of systems updated and unchanged from 2/14 2018. She has more numbness in the left arm than right radiates down to the elbow involving the T1 and C8 distribution. Chronic back pain that radiates into her hips and thigh. Improvement with facet injections and lumbar MRI shows L3-4 adjacent stenosis with moderate to severe right greater than left lateral recess stenosis. This is from lumbar MRI 05/19/2016.   Objective: Vital Signs: BP (!) 147/89   Pulse 69   Physical Exam  Constitutional: She is oriented to person, place, and time. She appears well-developed.  HENT:  Head: Normocephalic.  Right Ear:  External ear normal.  Left Ear: External ear normal.  Eyes: Pupils are equal, round, and reactive to light.  Neck: No tracheal deviation present. No thyromegaly present.  Cardiovascular: Normal rate.   Pulmonary/Chest: Effort normal.  Abdominal: Soft.  Neurological: She is alert and oriented to person, place, and time.  Skin: Skin is warm and dry.  Psychiatric: She has a normal mood and affect. Her behavior is normal.     Ortho Exam patient has bilateral brachioplexus tenderness. Upper extremity reflexes are 1+ and symmetrical. Flexor profundus both hands and interossei are normal. No wrist or finger extension weakness. Decreased sensation T1 dermatome. Deltoids the biceps triceps supination pronation bilaterally is strong. Lower extremity show intact anterior tib EHL gastrocsoleus is strong. Well-healed lumbar incision. No sensory deficit in the lower extremities no lower extremity hyperreflexia.  Specialty Comments:  No specialty comments available.  Imaging: Cervical MRI 04/22/2016 shows bulky ligamentum flavum thickening disc bulge at C7-T1 with effacement of the CSF about the cord. Small focal area of myelomalacia within the cord related to spondylosis. Moderate to severe foraminal narrowing C3-4 on the right flattening the ventral cord and severe left and moderate right foraminal narrowing at C5-6. Mild right hemicord deformity at C4-5 with some disc bulge.    PMFS History: Patient Active Problem List   Diagnosis Date Noted  . Spinal stenosis of lumbar region without neurogenic claudication 06/01/2016  . Cervical radiculopathy 05/12/2016  . Spinal stenosis of cervical region 05/12/2016  . Cervicalgia 05/12/2016  . Ganglion of left hand 12/22/2014  . S/P lumbar fusion 04/16/2014   Past Medical History:  Diagnosis Date  . Arthritis    hands, back  . Hypertension   . Irritable bowel syndrome   . Rosacea   . Stroke [facility]) 12/01/2009   tia    Family History  Problem Relation Age of Onset  . Cancer Mother   . Uterine cancer Mother   . Hypertension Mother   . Heart attack Father     Past Surgical History:  Procedure Laterality Date  . ABDOMINAL HYSTERECTOMY    . BACK SURGERY    . CARPAL TUNNEL RELEASE Right 2009  . CARPAL TUNNEL RELEASE Left   . GANGLION CYST EXCISION Left 12/22/2014   Procedure: Excision Left Hand Ganglion cyst;  Surgeon: Marybelle Killings, MD;  Location: [facility];  Service: Orthopedics;  Laterality: Left;  . HEMORRHOID SURGERY    . TUBAL LIGATION     Social History   Occupational History  . Not on file.   Social History Main Topics  . Smoking status: Former Smoker    Quit date: 04/09/1992  . Smokeless tobacco: Never Used  . Alcohol use 2.4 oz/week    4 Glasses of wine per week     Comment: 4  . Drug use: No  . Sexual activity: Not on file

## 2016-08-19 DIAGNOSIS — M5416 Radiculopathy, lumbar region: Secondary | ICD-10-CM | POA: Diagnosis not present

## 2016-08-19 DIAGNOSIS — M5136 Other intervertebral disc degeneration, lumbar region: Secondary | ICD-10-CM | POA: Diagnosis not present

## 2016-08-29 ENCOUNTER — Other Ambulatory Visit (HOSPITAL_COMMUNITY): Payer: Self-pay | Admitting: *Deleted

## 2016-08-29 ENCOUNTER — Encounter (HOSPITAL_COMMUNITY)
Admission: RE | Admit: 2016-08-29 | Discharge: 2016-08-29 | Disposition: A | Discharge status: Home / Self Care | Payer: PPO | Source: Ambulatory Visit | Attending: Orthopaedic Surgery | Admitting: Orthopaedic Surgery

## 2016-08-29 ENCOUNTER — Encounter (HOSPITAL_COMMUNITY): Payer: Self-pay

## 2016-08-29 DIAGNOSIS — Z87891 Personal history of nicotine dependence: Secondary | ICD-10-CM | POA: Diagnosis not present

## 2016-08-29 DIAGNOSIS — H04123 Dry eye syndrome of bilateral lacrimal glands: Secondary | ICD-10-CM | POA: Diagnosis not present

## 2016-08-29 DIAGNOSIS — G9589 Other specified diseases of spinal cord: Secondary | ICD-10-CM | POA: Diagnosis not present

## 2016-08-29 DIAGNOSIS — Z90710 Acquired absence of both cervix and uterus: Secondary | ICD-10-CM | POA: Diagnosis not present

## 2016-08-29 DIAGNOSIS — M48061 Spinal stenosis, lumbar region without neurogenic claudication: Secondary | ICD-10-CM | POA: Insufficient documentation

## 2016-08-29 DIAGNOSIS — M479 Spondylosis, unspecified: Secondary | ICD-10-CM | POA: Diagnosis not present

## 2016-08-29 DIAGNOSIS — Z880 Allergy status to penicillin: Secondary | ICD-10-CM | POA: Diagnosis not present

## 2016-08-29 DIAGNOSIS — M542 Cervicalgia: Secondary | ICD-10-CM | POA: Diagnosis not present

## 2016-08-29 DIAGNOSIS — Z981 Arthrodesis status: Secondary | ICD-10-CM | POA: Insufficient documentation

## 2016-08-29 DIAGNOSIS — M19041 Primary osteoarthritis, right hand: Secondary | ICD-10-CM | POA: Diagnosis not present

## 2016-08-29 DIAGNOSIS — Z885 Allergy status to narcotic agent status: Secondary | ICD-10-CM | POA: Diagnosis not present

## 2016-08-29 DIAGNOSIS — M67442 Ganglion, left hand: Secondary | ICD-10-CM | POA: Diagnosis not present

## 2016-08-29 DIAGNOSIS — M5412 Radiculopathy, cervical region: Secondary | ICD-10-CM | POA: Insufficient documentation

## 2016-08-29 DIAGNOSIS — E785 Hyperlipidemia, unspecified: Secondary | ICD-10-CM | POA: Diagnosis not present

## 2016-08-29 DIAGNOSIS — I739 Peripheral vascular disease, unspecified: Secondary | ICD-10-CM | POA: Diagnosis not present

## 2016-08-29 DIAGNOSIS — Z01812 Encounter for preprocedural laboratory examination: Secondary | ICD-10-CM | POA: Insufficient documentation

## 2016-08-29 DIAGNOSIS — E669 Obesity, unspecified: Secondary | ICD-10-CM | POA: Diagnosis not present

## 2016-08-29 DIAGNOSIS — M4802 Spinal stenosis, cervical region: Secondary | ICD-10-CM | POA: Insufficient documentation

## 2016-08-29 DIAGNOSIS — Z8249 Family history of ischemic heart disease and other diseases of the circulatory system: Secondary | ICD-10-CM | POA: Diagnosis not present

## 2016-08-29 DIAGNOSIS — I10 Essential (primary) hypertension: Secondary | ICD-10-CM | POA: Diagnosis not present

## 2016-08-29 DIAGNOSIS — Z7982 Long term (current) use of aspirin: Secondary | ICD-10-CM | POA: Diagnosis not present

## 2016-08-29 DIAGNOSIS — Z888 Allergy status to other drugs, medicaments and biological substances status: Secondary | ICD-10-CM | POA: Diagnosis not present

## 2016-08-29 DIAGNOSIS — M19042 Primary osteoarthritis, left hand: Secondary | ICD-10-CM | POA: Diagnosis not present

## 2016-08-29 DIAGNOSIS — M5013 Cervical disc disorder with radiculopathy, cervicothoracic region: Secondary | ICD-10-CM | POA: Diagnosis not present

## 2016-08-29 DIAGNOSIS — L719 Rosacea, unspecified: Secondary | ICD-10-CM | POA: Diagnosis not present

## 2016-08-29 DIAGNOSIS — Z8673 Personal history of transient ischemic attack (TIA), and cerebral infarction without residual deficits: Secondary | ICD-10-CM | POA: Diagnosis not present

## 2016-08-29 DIAGNOSIS — Z6833 Body mass index (BMI) 33.0-33.9, adult: Secondary | ICD-10-CM | POA: Diagnosis not present

## 2016-08-29 DIAGNOSIS — Z79899 Other long term (current) drug therapy: Secondary | ICD-10-CM | POA: Diagnosis not present

## 2016-08-29 DIAGNOSIS — K589 Irritable bowel syndrome without diarrhea: Secondary | ICD-10-CM | POA: Diagnosis not present

## 2016-08-29 HISTORY — DX: Constipation, unspecified: K59.00

## 2016-08-29 HISTORY — DX: Flushing: R23.2

## 2016-08-29 HISTORY — DX: Hyperlipidemia, unspecified: E78.5

## 2016-08-29 LAB — COMPREHENSIVE METABOLIC PANEL
ALBUMIN: 4 g/dL (ref 3.5–5.0)
ALT: 25 U/L (ref 14–54)
AST: 23 U/L (ref 15–41)
Alkaline Phosphatase: 82 U/L (ref 38–126)
Anion gap: 7 (ref 5–15)
BUN: 15 mg/dL (ref 6–20)
CHLORIDE: 107 mmol/L (ref 101–111)
CO2: 25 mmol/L (ref 22–32)
Calcium: 9.1 mg/dL (ref 8.9–10.3)
Creatinine, Ser: 1 mg/dL (ref 0.44–1.00)
GFR calc Af Amer: >60 mL/min (ref >60)
GFR calc non Af Amer: 54 mL/min — ABNORMAL LOW (ref >60)
GLUCOSE: 70 mg/dL (ref 65–99)
POTASSIUM: 4.5 mmol/L (ref 3.5–5.1)
Sodium: 139 mmol/L (ref 135–145)
Total Bilirubin: 1 mg/dL (ref 0.3–1.2)
Total Protein: 6.4 g/dL — ABNORMAL LOW (ref 6.5–8.1)

## 2016-08-29 LAB — CBC
HCT: 46.4 % — ABNORMAL HIGH (ref 36.0–46.0)
Hemoglobin: 15 g/dL (ref 12.0–15.0)
MCH: 29.5 pg (ref 26.0–34.0)
MCHC: 32.3 g/dL (ref 30.0–36.0)
MCV: 91.3 fL (ref 78.0–100.0)
PLATELETS: 201 10*3/uL (ref 150–400)
RBC: 5.08 MIL/uL (ref 3.87–5.11)
RDW: 13.5 % (ref 11.5–15.5)
WBC: 10 10*3/uL (ref 4.0–10.5)

## 2016-08-29 LAB — URINALYSIS, ROUTINE W REFLEX MICROSCOPIC
Bilirubin Urine: NEGATIVE
GLUCOSE, UA: NEGATIVE mg/dL
HGB URINE DIPSTICK: NEGATIVE
Ketones, ur: NEGATIVE mg/dL
Leukocytes, UA: NEGATIVE
Nitrite: NEGATIVE
Protein, ur: NEGATIVE mg/dL
SPECIFIC GRAVITY, URINE: 1.021 (ref 1.005–1.030)
pH: 5 (ref 5.0–8.0)

## 2016-08-29 LAB — SURGICAL PCR SCREEN
MRSA, PCR: NEGATIVE
STAPHYLOCOCCUS AUREUS: NEGATIVE

## 2016-08-29 NOTE — Pre-Procedure Instructions (Signed)
MARIE BOROWSKI  08/29/2016      [facility] [address], [place]  [address] Phone: [phone] Fax: [phone]    Your procedure is scheduled on 09-07-2016 Wednesday .  Report to [facility] Admitting at 10:30 AM   Call this number if you have problems the morning of surgery:  [phone]   Remember:  Do not eat food or drink liquids after midnight.   Take these medicines the morning of surgery with A SIP OF WATER bupropion(Wellbutrin)[place] eye drops if needed  STOP ASPIRIN,ANTIINFLAMATORIES (IBUPROFEN,ALEVE,MOTRIN,ADVIL,GOODY'S POWDERS),HERBAL SUPPLEMENTS,FISH OIL,AND VITAMINS 5-7 DAYS PRIOR TO SURGERY     Do not wear jewelry, make-up or nail polish.  Do not wear lotions, powders, or perfumes, or deoderant.  Do not shave 48 hours prior to surgery.  Men may shave face and neck.  Do not bring valuables to the hospital.  [facility] is not responsible for any belongings or valuables.  Contacts, dentures or bridgework may not be worn into surgery.  Leave your suitcase in the car.  After surgery it may be brought to your room.  For patients admitted to the hospital, discharge time will be determined by your treatment team.  Patients discharged the day of surgery will not be allowed to drive home.    Special Instructions: [facility] - Preparing for Surgery  Before surgery, you can play an important role.  Because skin is not sterile, your skin needs to be as free of germs as possible.  You can reduce the number of germs on you skin by washing with CHG (chlorahexidine gluconate) soap before surgery.  CHG is an antiseptic cleaner which kills germs and bonds with the skin to continue killing germs even after washing.  Please DO NOT use if you have an allergy to CHG or antibacterial soaps.  If your skin becomes reddened/irritated stop using the CHG and inform your nurse when you arrive at Short  Stay.  Do not shave (including legs and underarms) for at least 48 hours prior to the first CHG shower.  You may shave your face.  Please follow these instructions carefully:   1.  Shower with CHG Soap the night before surgery and the   morning of Surgery.  2.  If you choose to wash your hair, wash your hair first as usual with your normal shampoo.  3.  After you shampoo, rinse your hair and body thoroughly to remove the  Shampoo.  4.  Use CHG as you would any other liquid soap.  You can apply chg directly  to the skin and wash gently with scrungie or a clean washcloth.  5.  Apply the CHG Soap to your body ONLY FROM THE NECK DOWN.   Do not use on open wounds or open sores.  Avoid contact with your eyes,  ears, mouth and genitals (private parts).  Wash genitals (private parts) with your normal soap.  6.  Wash thoroughly, paying special attention to the area where your surgery will be performed.  7.  Thoroughly rinse your body with warm water from the neck down.  8.  DO NOT shower/wash with your normal soap after using and rinsing o  the CHG Soap.  9.  Pat yourself dry with a clean towel.            10.  Wear clean pajamas.            11.  Place clean sheets on your bed the night of your first shower and do not sleep with pets.  Day of Surgery  Do not apply any lotions/deodorants the morning of surgery.  Please wear clean clothes to the hospital/surgery center.   Please read over the following fact sheets that you were given. MRSA Information and Surgical Site Infection Prevention,Incentive Spirmetry

## 2016-08-30 ENCOUNTER — Other Ambulatory Visit (HOSPITAL_COMMUNITY): Payer: PPO

## 2016-09-05 ENCOUNTER — Telehealth (INDEPENDENT_AMBULATORY_CARE_PROVIDER_SITE_OTHER): Payer: Self-pay | Admitting: Orthopaedic Surgery

## 2016-09-05 NOTE — Telephone Encounter (Signed)
Ok thanks 

## 2016-09-05 NOTE — Telephone Encounter (Signed)
Patient called asking if she could take her 50mg  tramadol 4 times a day instead of twice? And she also wanted to know if she could take it up til the day of her surgery? CB # [phone]

## 2016-09-05 NOTE — Telephone Encounter (Signed)
Please advise 

## 2016-09-06 NOTE — Telephone Encounter (Signed)
I called patient and advised. 

## 2016-09-07 ENCOUNTER — Inpatient Hospital Stay (HOSPITAL_COMMUNITY): Payer: PPO | Admitting: Certified Registered Nurse Anesthetist

## 2016-09-07 ENCOUNTER — Inpatient Hospital Stay (HOSPITAL_COMMUNITY): Payer: PPO

## 2016-09-07 ENCOUNTER — Encounter (HOSPITAL_COMMUNITY)
Admission: RE | Disposition: A | Discharge status: Home / Self Care | Payer: Self-pay | Source: Ambulatory Visit | Attending: Orthopaedic Surgery

## 2016-09-07 ENCOUNTER — Observation Stay (HOSPITAL_COMMUNITY)
Admission: RE | Admit: 2016-09-07 | Discharge: 2016-09-08 | Disposition: A | Discharge status: Home / Self Care | Payer: PPO | Source: Ambulatory Visit | Attending: Orthopaedic Surgery | Admitting: Orthopaedic Surgery

## 2016-09-07 ENCOUNTER — Encounter (HOSPITAL_COMMUNITY): Payer: Self-pay | Admitting: Surgery

## 2016-09-07 ENCOUNTER — Observation Stay (HOSPITAL_COMMUNITY): Payer: PPO

## 2016-09-07 DIAGNOSIS — G9589 Other specified diseases of spinal cord: Secondary | ICD-10-CM | POA: Diagnosis not present

## 2016-09-07 DIAGNOSIS — Z880 Allergy status to penicillin: Secondary | ICD-10-CM | POA: Insufficient documentation

## 2016-09-07 DIAGNOSIS — M19042 Primary osteoarthritis, left hand: Secondary | ICD-10-CM | POA: Insufficient documentation

## 2016-09-07 DIAGNOSIS — E785 Hyperlipidemia, unspecified: Secondary | ICD-10-CM | POA: Insufficient documentation

## 2016-09-07 DIAGNOSIS — Z885 Allergy status to narcotic agent status: Secondary | ICD-10-CM | POA: Insufficient documentation

## 2016-09-07 DIAGNOSIS — M5013 Cervical disc disorder with radiculopathy, cervicothoracic region: Principal | ICD-10-CM | POA: Insufficient documentation

## 2016-09-07 DIAGNOSIS — M4802 Spinal stenosis, cervical region: Secondary | ICD-10-CM | POA: Diagnosis not present

## 2016-09-07 DIAGNOSIS — K589 Irritable bowel syndrome without diarrhea: Secondary | ICD-10-CM | POA: Insufficient documentation

## 2016-09-07 DIAGNOSIS — Z8673 Personal history of transient ischemic attack (TIA), and cerebral infarction without residual deficits: Secondary | ICD-10-CM | POA: Diagnosis not present

## 2016-09-07 DIAGNOSIS — Z6833 Body mass index (BMI) 33.0-33.9, adult: Secondary | ICD-10-CM | POA: Insufficient documentation

## 2016-09-07 DIAGNOSIS — M19041 Primary osteoarthritis, right hand: Secondary | ICD-10-CM | POA: Diagnosis not present

## 2016-09-07 DIAGNOSIS — Z7982 Long term (current) use of aspirin: Secondary | ICD-10-CM | POA: Insufficient documentation

## 2016-09-07 DIAGNOSIS — I10 Essential (primary) hypertension: Secondary | ICD-10-CM | POA: Insufficient documentation

## 2016-09-07 DIAGNOSIS — Z87891 Personal history of nicotine dependence: Secondary | ICD-10-CM | POA: Insufficient documentation

## 2016-09-07 DIAGNOSIS — M4803 Spinal stenosis, cervicothoracic region: Secondary | ICD-10-CM | POA: Diagnosis not present

## 2016-09-07 DIAGNOSIS — Z8249 Family history of ischemic heart disease and other diseases of the circulatory system: Secondary | ICD-10-CM | POA: Insufficient documentation

## 2016-09-07 DIAGNOSIS — M479 Spondylosis, unspecified: Secondary | ICD-10-CM | POA: Insufficient documentation

## 2016-09-07 DIAGNOSIS — L719 Rosacea, unspecified: Secondary | ICD-10-CM | POA: Diagnosis not present

## 2016-09-07 DIAGNOSIS — M5412 Radiculopathy, cervical region: Secondary | ICD-10-CM | POA: Diagnosis not present

## 2016-09-07 DIAGNOSIS — Z888 Allergy status to other drugs, medicaments and biological substances status: Secondary | ICD-10-CM | POA: Insufficient documentation

## 2016-09-07 DIAGNOSIS — M47812 Spondylosis without myelopathy or radiculopathy, cervical region: Secondary | ICD-10-CM | POA: Diagnosis not present

## 2016-09-07 DIAGNOSIS — M48061 Spinal stenosis, lumbar region without neurogenic claudication: Secondary | ICD-10-CM | POA: Diagnosis not present

## 2016-09-07 DIAGNOSIS — Z419 Encounter for procedure for purposes other than remedying health state, unspecified: Secondary | ICD-10-CM

## 2016-09-07 DIAGNOSIS — S14117D Complete lesion at C7 level of cervical spinal cord, subsequent encounter: Secondary | ICD-10-CM | POA: Diagnosis not present

## 2016-09-07 DIAGNOSIS — Z79899 Other long term (current) drug therapy: Secondary | ICD-10-CM | POA: Insufficient documentation

## 2016-09-07 DIAGNOSIS — I739 Peripheral vascular disease, unspecified: Secondary | ICD-10-CM | POA: Insufficient documentation

## 2016-09-07 DIAGNOSIS — Z90710 Acquired absence of both cervix and uterus: Secondary | ICD-10-CM | POA: Insufficient documentation

## 2016-09-07 DIAGNOSIS — M542 Cervicalgia: Secondary | ICD-10-CM | POA: Diagnosis not present

## 2016-09-07 DIAGNOSIS — H04123 Dry eye syndrome of bilateral lacrimal glands: Secondary | ICD-10-CM | POA: Insufficient documentation

## 2016-09-07 DIAGNOSIS — E669 Obesity, unspecified: Secondary | ICD-10-CM | POA: Insufficient documentation

## 2016-09-07 HISTORY — DX: Other specified postprocedural states: Z98.890

## 2016-09-07 HISTORY — PX: ANTERIOR CERVICAL DECOMP/DISCECTOMY FUSION: SHX1161

## 2016-09-07 HISTORY — PX: CERVICAL DISCECTOMY: SHX98

## 2016-09-07 HISTORY — DX: Other complications of anesthesia, initial encounter: T88.59XA

## 2016-09-07 HISTORY — DX: Other specified postprocedural states: R11.2

## 2016-09-07 HISTORY — DX: Spinal stenosis, cervical region: M48.02

## 2016-09-07 HISTORY — DX: Adverse effect of unspecified anesthetic, initial encounter: T41.45XA

## 2016-09-07 SURGERY — ANTERIOR CERVICAL DECOMPRESSION/DISCECTOMY FUSION 1 LEVEL
Anesthesia: General

## 2016-09-07 MED ORDER — SUGAMMADEX SODIUM 200 MG/2ML IV SOLN
INTRAVENOUS | Status: DC | PRN
  Administered 2016-09-07: 200 mg via INTRAVENOUS

## 2016-09-07 MED ORDER — DICYCLOMINE HCL 10 MG PO CAPS: 10.0000 mg | ORAL_CAPSULE | Freq: Two times a day (BID) | ORAL | Status: DC | PRN

## 2016-09-07 MED ORDER — FENTANYL CITRATE (PF) 250 MCG/5ML IJ SOLN
INTRAMUSCULAR | Status: AC
  Filled 2016-09-07: qty 5

## 2016-09-07 MED ORDER — 0.9 % SODIUM CHLORIDE (POUR BTL) OPTIME
TOPICAL | Status: DC | PRN
  Administered 2016-09-07: 1000 mL

## 2016-09-07 MED ORDER — PROPOFOL 10 MG/ML IV BOLUS
INTRAVENOUS | Status: DC | PRN
  Administered 2016-09-07: 20 mg via INTRAVENOUS
  Administered 2016-09-07: 160 mg via INTRAVENOUS

## 2016-09-07 MED ORDER — SUGAMMADEX SODIUM 200 MG/2ML IV SOLN
INTRAVENOUS | Status: AC
  Filled 2016-09-07: qty 6

## 2016-09-07 MED ORDER — PHENYLEPHRINE 40 MCG/ML (10ML) SYRINGE FOR IV PUSH (FOR BLOOD PRESSURE SUPPORT)
PREFILLED_SYRINGE | INTRAVENOUS | Status: AC
Start: 2016-09-07 — End: 2016-09-07
  Filled 2016-09-07: qty 30

## 2016-09-07 MED ORDER — HYDROMORPHONE HCL 1 MG/ML IJ SOLN
0.5000 mg | INTRAMUSCULAR | Status: DC | PRN
  Administered 2016-09-07: 0.5 mg via INTRAVENOUS
  Filled 2016-09-07: qty 1

## 2016-09-07 MED ORDER — BUPIVACAINE-EPINEPHRINE (PF) 0.5% -1:200000 IJ SOLN
INTRAMUSCULAR | Status: AC
  Filled 2016-09-07: qty 30

## 2016-09-07 MED ORDER — FENTANYL CITRATE (PF) 100 MCG/2ML IJ SOLN
50.0000 ug | Freq: Once | INTRAMUSCULAR | Status: AC
  Administered 2016-09-07: 50 ug via INTRAVENOUS

## 2016-09-07 MED ORDER — ONDANSETRON HCL 4 MG PO TABS
4.0000 mg | ORAL_TABLET | Freq: Four times a day (QID) | ORAL | Status: DC | PRN
  Filled 2016-09-07: qty 1

## 2016-09-07 MED ORDER — OXYCODONE HCL 5 MG PO TABS
5.0000 mg | ORAL_TABLET | Freq: Four times a day (QID) | ORAL | Status: DC | PRN
  Administered 2016-09-07: 5 mg via ORAL
  Filled 2016-09-07 (×2): qty 1

## 2016-09-07 MED ORDER — BUPROPION HCL ER (XL) 150 MG PO TB24
150.0000 mg | ORAL_TABLET | Freq: Every morning | ORAL | Status: DC
  Administered 2016-09-08: 150 mg via ORAL
  Filled 2016-09-07: qty 1

## 2016-09-07 MED ORDER — SODIUM CHLORIDE 0.9% FLUSH
3.0000 mL | Freq: Two times a day (BID) | INTRAVENOUS | Status: DC
  Administered 2016-09-07: 3 mL via INTRAVENOUS

## 2016-09-07 MED ORDER — SODIUM CHLORIDE 0.9 % IV SOLN: 250.0000 mL | INTRAVENOUS | Status: DC

## 2016-09-07 MED ORDER — METOCLOPRAMIDE HCL 5 MG/ML IJ SOLN
5.0000 mg | Freq: Four times a day (QID) | INTRAMUSCULAR | Status: DC | PRN
  Administered 2016-09-07: 10 mg via INTRAVENOUS

## 2016-09-07 MED ORDER — SODIUM CHLORIDE 0.9 % IV SOLN
INTRAVENOUS | Status: DC
  Administered 2016-09-07: 19:00:00 via INTRAVENOUS

## 2016-09-07 MED ORDER — POLYETHYLENE GLYCOL 3350 17 G PO PACK
17.0000 g | PACK | Freq: Every day | ORAL | Status: DC
  Filled 2016-09-07: qty 1

## 2016-09-07 MED ORDER — SODIUM CHLORIDE 0.9% FLUSH: 3.0000 mL | INTRAVENOUS | Status: DC | PRN

## 2016-09-07 MED ORDER — LIDOCAINE 2% (20 MG/ML) 5 ML SYRINGE
INTRAMUSCULAR | Status: DC | PRN
  Administered 2016-09-07: 80 mg via INTRAVENOUS

## 2016-09-07 MED ORDER — CEFAZOLIN SODIUM 1 G IJ SOLR
INTRAMUSCULAR | Status: AC
  Filled 2016-09-07: qty 10

## 2016-09-07 MED ORDER — DOCUSATE SODIUM 100 MG PO CAPS
100.0000 mg | ORAL_CAPSULE | Freq: Two times a day (BID) | ORAL | Status: DC
  Administered 2016-09-08: 100 mg via ORAL
  Filled 2016-09-07: qty 1

## 2016-09-07 MED ORDER — ONDANSETRON HCL 4 MG/2ML IJ SOLN
INTRAMUSCULAR | Status: DC | PRN
  Administered 2016-09-07 (×2): 4 mg via INTRAVENOUS

## 2016-09-07 MED ORDER — EPHEDRINE 5 MG/ML INJ
INTRAVENOUS | Status: AC
  Filled 2016-09-07: qty 20

## 2016-09-07 MED ORDER — FENTANYL CITRATE (PF) 100 MCG/2ML IJ SOLN: INTRAMUSCULAR | Status: DC | PRN

## 2016-09-07 MED ORDER — ACETAMINOPHEN 325 MG PO TABS: 650.0000 mg | ORAL_TABLET | ORAL | Status: DC | PRN

## 2016-09-07 MED ORDER — LACTATED RINGERS IV SOLN
INTRAVENOUS | Status: DC
  Administered 2016-09-07 (×2): via INTRAVENOUS

## 2016-09-07 MED ORDER — FENTANYL CITRATE (PF) 100 MCG/2ML IJ SOLN
INTRAMUSCULAR | Status: AC
  Filled 2016-09-07: qty 2

## 2016-09-07 MED ORDER — HYDROMORPHONE HCL 1 MG/ML IJ SOLN: 0.2500 mg | INTRAMUSCULAR | Status: DC | PRN

## 2016-09-07 MED ORDER — MIDAZOLAM HCL 5 MG/5ML IJ SOLN
INTRAMUSCULAR | Status: DC | PRN
  Administered 2016-09-07: 1 mg via INTRAVENOUS

## 2016-09-07 MED ORDER — CHLORHEXIDINE GLUCONATE 4 % EX LIQD: 60.0000 mL | Freq: Once | CUTANEOUS | Status: DC

## 2016-09-07 MED ORDER — FENTANYL CITRATE (PF) 250 MCG/5ML IJ SOLN
INTRAMUSCULAR | Status: DC | PRN
  Administered 2016-09-07: 25 ug via INTRAVENOUS
  Administered 2016-09-07: 75 ug via INTRAVENOUS
  Administered 2016-09-07 (×2): 25 ug via INTRAVENOUS
  Administered 2016-09-07: 50 ug via INTRAVENOUS
  Administered 2016-09-07 (×2): 25 ug via INTRAVENOUS

## 2016-09-07 MED ORDER — NEOSTIGMINE METHYLSULFATE 5 MG/5ML IV SOSY
PREFILLED_SYRINGE | INTRAVENOUS | Status: AC
Start: 2016-09-07 — End: 2016-09-07
  Filled 2016-09-07: qty 5

## 2016-09-07 MED ORDER — HEMOSTATIC AGENTS (NO CHARGE) OPTIME
TOPICAL | Status: DC | PRN
  Administered 2016-09-07: 1 via TOPICAL

## 2016-09-07 MED ORDER — LIDOCAINE 2% (20 MG/ML) 5 ML SYRINGE
INTRAMUSCULAR | Status: AC
  Filled 2016-09-07: qty 25

## 2016-09-07 MED ORDER — PROMETHAZINE HCL 25 MG/ML IJ SOLN
12.5000 mg | Freq: Four times a day (QID) | INTRAMUSCULAR | Status: DC | PRN
  Administered 2016-09-07: 12.5 mg via INTRAVENOUS
  Filled 2016-09-07: qty 1

## 2016-09-07 MED ORDER — LISINOPRIL 5 MG PO TABS: 5.0000 mg | ORAL_TABLET | Freq: Every day | ORAL | Status: DC

## 2016-09-07 MED ORDER — PROMETHAZINE HCL 25 MG/ML IJ SOLN: 6.2500 mg | INTRAMUSCULAR | Status: DC | PRN

## 2016-09-07 MED ORDER — DEXAMETHASONE SODIUM PHOSPHATE 10 MG/ML IJ SOLN
INTRAMUSCULAR | Status: DC | PRN
  Administered 2016-09-07: 10 mg via INTRAVENOUS

## 2016-09-07 MED ORDER — EPHEDRINE SULFATE-NACL 50-0.9 MG/10ML-% IV SOSY
PREFILLED_SYRINGE | INTRAVENOUS | Status: DC | PRN
  Administered 2016-09-07 (×3): 5 mg via INTRAVENOUS

## 2016-09-07 MED ORDER — PROPOFOL 1000 MG/100ML IV EMUL
INTRAVENOUS | Status: AC
Start: 2016-09-07 — End: 2016-09-07
  Filled 2016-09-07: qty 100

## 2016-09-07 MED ORDER — MENTHOL 3 MG MT LOZG: 1.0000 | LOZENGE | OROMUCOSAL | Status: DC | PRN

## 2016-09-07 MED ORDER — ROCURONIUM BROMIDE 10 MG/ML (PF) SYRINGE
PREFILLED_SYRINGE | INTRAVENOUS | Status: DC | PRN
  Administered 2016-09-07: 10 mg via INTRAVENOUS
  Administered 2016-09-07: 50 mg via INTRAVENOUS

## 2016-09-07 MED ORDER — VANCOMYCIN HCL IN DEXTROSE 1-5 GM/200ML-% IV SOLN
INTRAVENOUS | Status: AC
  Filled 2016-09-07: qty 200

## 2016-09-07 MED ORDER — ONDANSETRON HCL 4 MG/2ML IJ SOLN
4.0000 mg | Freq: Four times a day (QID) | INTRAMUSCULAR | Status: DC | PRN
  Administered 2016-09-07: 4 mg via INTRAVENOUS
  Filled 2016-09-07: qty 2

## 2016-09-07 MED ORDER — ACETAMINOPHEN 650 MG RE SUPP: 650.0000 mg | RECTAL | Status: DC | PRN

## 2016-09-07 MED ORDER — ATORVASTATIN CALCIUM 20 MG PO TABS: 20.0000 mg | ORAL_TABLET | Freq: Every day | ORAL | Status: DC

## 2016-09-07 MED ORDER — PHENOL 1.4 % MT LIQD: 1.0000 | OROMUCOSAL | Status: DC | PRN

## 2016-09-07 MED ORDER — ONDANSETRON HCL 4 MG/2ML IJ SOLN
INTRAMUSCULAR | Status: AC
  Filled 2016-09-07: qty 10

## 2016-09-07 MED ORDER — VANCOMYCIN HCL IN DEXTROSE 1-5 GM/200ML-% IV SOLN: 1000.0000 mg | INTRAVENOUS | Status: DC

## 2016-09-07 MED ORDER — MIDAZOLAM HCL 2 MG/2ML IJ SOLN
INTRAMUSCULAR | Status: AC
  Filled 2016-09-07: qty 2

## 2016-09-07 MED ORDER — DEXAMETHASONE SODIUM PHOSPHATE 10 MG/ML IJ SOLN
INTRAMUSCULAR | Status: AC
  Filled 2016-09-07: qty 5

## 2016-09-07 MED ORDER — BUPIVACAINE-EPINEPHRINE 0.5% -1:200000 IJ SOLN
INTRAMUSCULAR | Status: DC | PRN
  Administered 2016-09-07: 10 mL

## 2016-09-07 MED ORDER — METOCLOPRAMIDE HCL 5 MG/ML IJ SOLN
5.0000 mg | Freq: Four times a day (QID) | INTRAMUSCULAR | Status: DC
  Filled 2016-09-07: qty 2

## 2016-09-07 SURGICAL SUPPLY — 57 items
BENZOIN TINCTURE PRP APPL 2/3 (GAUZE/BANDAGES/DRESSINGS) ×3 IMPLANT
BIT DRILL SKYLINE 12MM (BIT) ×1 IMPLANT
BLADE CLIPPER SURG (BLADE) IMPLANT
BONE CERV LORDOTIC 14.5X12X9 (Bone Implant) ×3 IMPLANT
BUR ROUND FLUTED 4 SOFT TCH (BURR) ×2 IMPLANT
BUR ROUND FLUTED 4MM SOFT TCH (BURR) ×1
CLOSURE STERI-STRIP 1/2X4 (GAUZE/BANDAGES/DRESSINGS) ×1
CLOSURE WOUND 1/2 X4 (GAUZE/BANDAGES/DRESSINGS) ×1
CLSR STERI-STRIP ANTIMIC 1/2X4 (GAUZE/BANDAGES/DRESSINGS) ×2 IMPLANT
COLLAR CERV LO CONTOUR FIRM DE (SOFTGOODS) ×3 IMPLANT
COVER MAYO STAND STRL (DRAPES) ×6 IMPLANT
COVER SURGICAL LIGHT HANDLE (MISCELLANEOUS) ×3 IMPLANT
CRADLE DONUT ADULT HEAD (MISCELLANEOUS) ×3 IMPLANT
DRAPE C-ARM 42X72 X-RAY (DRAPES) ×3 IMPLANT
DRAPE HALF SHEET 40X57 (DRAPES) ×6 IMPLANT
DRAPE MICROSCOPE LEICA (MISCELLANEOUS) ×3 IMPLANT
DRILL BIT SKYLINE 12MM (BIT) ×2
DURAPREP 6ML APPLICATOR 50/CS (WOUND CARE) ×3 IMPLANT
ELECT COATED BLADE 2.86 ST (ELECTRODE) ×3 IMPLANT
ELECT REM PT RETURN 9FT ADLT (ELECTROSURGICAL) ×3
ELECTRODE REM PT RTRN 9FT ADLT (ELECTROSURGICAL) ×1 IMPLANT
EVACUATOR 1/8 PVC DRAIN (DRAIN) ×3 IMPLANT
GAUZE SPONGE 4X4 12PLY STRL (GAUZE/BANDAGES/DRESSINGS) ×3 IMPLANT
GAUZE SPONGE 4X4 16PLY XRAY LF (GAUZE/BANDAGES/DRESSINGS) ×3 IMPLANT
GLOVE BIOGEL PI IND STRL 8 (GLOVE) ×2 IMPLANT
GLOVE BIOGEL PI INDICATOR 8 (GLOVE) ×4
GLOVE ORTHO TXT STRL SZ7.5 (GLOVE) ×6 IMPLANT
GOWN STRL REUS W/ TWL LRG LVL3 (GOWN DISPOSABLE) ×1 IMPLANT
GOWN STRL REUS W/ TWL XL LVL3 (GOWN DISPOSABLE) ×1 IMPLANT
GOWN STRL REUS W/TWL 2XL LVL3 (GOWN DISPOSABLE) ×3 IMPLANT
GOWN STRL REUS W/TWL LRG LVL3 (GOWN DISPOSABLE) ×2
GOWN STRL REUS W/TWL XL LVL3 (GOWN DISPOSABLE) ×2
HEAD HALTER (SOFTGOODS) ×3 IMPLANT
KIT BASIN OR (CUSTOM PROCEDURE TRAY) ×3 IMPLANT
KIT ROOM TURNOVER OR (KITS) ×3 IMPLANT
MANIFOLD NEPTUNE II (INSTRUMENTS) ×3 IMPLANT
NEEDLE 25GX 5/8IN NON SAFETY (NEEDLE) ×3 IMPLANT
NEEDLE SPNL 18GX3.5 QUINCKE PK (NEEDLE) ×3 IMPLANT
NS IRRIG 1000ML POUR BTL (IV SOLUTION) ×3 IMPLANT
PACK ORTHO CERVICAL (CUSTOM PROCEDURE TRAY) ×3 IMPLANT
PAD ARMBOARD 7.5X6 YLW CONV (MISCELLANEOUS) ×6 IMPLANT
PATTIES SURGICAL .5 X.5 (GAUZE/BANDAGES/DRESSINGS) IMPLANT
PLATE SKYLINE 12MM (Plate) ×3 IMPLANT
SCREW VARIABLE SELF TAP 12MM (Screw) ×12 IMPLANT
SPONGE INTESTINAL PEANUT (DISPOSABLE) ×3 IMPLANT
STRIP CLOSURE SKIN 1/2X4 (GAUZE/BANDAGES/DRESSINGS) ×2 IMPLANT
SURGIFLO W/THROMBIN 8M KIT (HEMOSTASIS) ×3 IMPLANT
SUT BONE WAX W31G (SUTURE) ×3 IMPLANT
SUT VIC AB 3-0 PS2 18 (SUTURE) ×2
SUT VIC AB 3-0 PS2 18XBRD (SUTURE) ×1 IMPLANT
SUT VIC AB 4-0 PS2 27 (SUTURE) ×3 IMPLANT
SYR BULB 3OZ (MISCELLANEOUS) ×3 IMPLANT
TAPE CLOTH SURG 4X10 WHT LF (GAUZE/BANDAGES/DRESSINGS) ×3 IMPLANT
TAPE UMBILICAL COTTON 1/8X30 (MISCELLANEOUS) ×3 IMPLANT
TOWEL OR 17X24 6PK STRL BLUE (TOWEL DISPOSABLE) ×3 IMPLANT
TOWEL OR 17X26 10 PK STRL BLUE (TOWEL DISPOSABLE) ×3 IMPLANT
WATER STERILE IRR 1000ML POUR (IV SOLUTION) IMPLANT

## 2016-09-07 NOTE — Anesthesia Preprocedure Evaluation (Addendum)
Anesthesia Evaluation  Patient identified by MRN, date of birth, ID band Patient awake    Reviewed: Allergy & Precautions, NPO status , Patient's Chart, lab work & pertinent test results  History of Anesthesia Complications (+) PONV and history of anesthetic complications  Airway Mallampati: II   Neck ROM: Full    Dental no notable dental hx. (+) Caps, Dental Advisory Given   Pulmonary former smoker,    breath sounds clear to auscultation       Cardiovascular hypertension, + Peripheral Vascular Disease   Rhythm:Regular Rate:Normal     Neuro/Psych  Neuromuscular disease    GI/Hepatic negative GI ROS, Neg liver ROS,   Endo/Other  negative endocrine ROS  Renal/GU negative Renal ROS     Musculoskeletal   Abdominal (+) + obese,   Peds  Hematology   Anesthesia Other Findings   Reproductive/Obstetrics                            Anesthesia Physical Anesthesia Plan  ASA: III  Anesthesia Plan: General   Post-op Pain Management:    Induction: Intravenous  PONV Risk Score and Plan: 4 or greater and Ondansetron, Dexamethasone, Propofol, Midazolam and Treatment may vary due to age or medical condition  Airway Management Planned: Oral ETT  Additional Equipment:   Intra-op Plan:   Post-operative Plan: Extubation in OR  Informed Consent: I have reviewed the patients History and Physical, chart, labs and discussed the procedure including the risks, benefits and alternatives for the proposed anesthesia with the patient or authorized representative who has indicated his/her understanding and acceptance.   Dental advisory given  Plan Discussed with: CRNA  Anesthesia Plan Comments:         Anesthesia Quick Evaluation

## 2016-09-07 NOTE — Progress Notes (Signed)
Orthopedic Tech Progress Note Patient Details:  Christina Booker 1940-10-25 [id number]  Ortho Devices Type of Ortho Device: Soft collar Ortho Device/Splint Location: at bedside Ortho Device/Splint Interventions: Criss Alvine 09/07/2016, 7:11 PM

## 2016-09-07 NOTE — H&P (Signed)
Christina Booker is an 76 y.o. female.   Chief Complaint: Neck pain and upper extremity radiculopathy HPI: Patient with history of C7-T1 stenosis/HNP and the above complaint presents for surgical intervention. Progressively worsening symptoms. Failed conservative treatment.  Past Medical History:  Diagnosis Date  . Arthritis    hands, back  . Constipation   . Hot flashes   . Hyperlipidemia   . Hypertension   . Irritable bowel syndrome   . Rosacea   . Stroke [facility]) 12/01/2009   tia    Past Surgical History:  Procedure Laterality Date  . ABDOMINAL HYSTERECTOMY    . BACK SURGERY    . CARPAL TUNNEL RELEASE Right 2009  . CARPAL TUNNEL RELEASE Left   . CATARACT EXTRACTION, BILATERAL    . EYE SURGERY    . GANGLION CYST EXCISION Left 12/22/2014   Procedure: Excision Left Hand Ganglion cyst;  Surgeon: Marybelle Killings, MD;  Location: [place];  Service: Orthopedics;  Laterality: Left;  . HEMORRHOID SURGERY    . TUBAL LIGATION      Family History  Problem Relation Age of Onset  . Cancer Mother   . Uterine cancer Mother   . Hypertension Mother   . Heart attack Father    Social History:  reports that she quit smoking about 24 years ago. She has never used smokeless tobacco. She reports that she drinks about 2.4 oz of alcohol per week . She reports that she does not use drugs.  Allergies:  Allergies  Allergen Reactions  . Methocarbamol Nausea Only  . Oxycodone Nausea Only  . Penicillins Rash    Has patient had a PCN reaction causing immediate rash, facial/tongue/throat swelling, SOB or lightheadedness with hypotension: Yes Has patient had a PCN reaction causing severe rash involving mucus membranes or skin necrosis: No Has patient had a PCN reaction that required hospitalization: No Has patient had a PCN reaction occurring within the last 10 years: No If all of the above answers are "NO", then may proceed with Cephalosporin use.    Medications Prior to Admission   Medication Sig Dispense Refill  . aspirin EC 81 MG tablet Take 81 mg by mouth daily.    Marland Kitchen atorvastatin (LIPITOR) 20 MG tablet Take 20 mg by mouth at bedtime.     Marland Kitchen buPROPion (WELLBUTRIN XL) 150 MG 24 hr tablet Take 150 mg by mouth every morning.    . Cholecalciferol (VITAMIN D3) 1000 units CAPS Take 1,000 Units by mouth 2 (two) times daily.    Marland Kitchen dicyclomine (BENTYL) 10 MG capsule Take 10 mg by mouth 2 (two) times daily as needed for spasms.    Marland Kitchen glycerin adult 2 G SUPP Place 1 suppository rectally 3 (three) times daily as needed (constipation).    Marland Kitchen lisinopril (PRINIVIL,ZESTRIL) 5 MG tablet Take 5 mg by mouth at bedtime.     . polyethylene glycol powder (GLYCOLAX/MIRALAX) powder Take by mouth every evening. 1 tablespoon    . Polyvinyl Alcohol-Povidone (FRESHKOTE OP) Place 2 drops into both eyes daily as needed (dry eyes).    . traMADol (ULTRAM) 50 MG tablet Take 1 tablet (50 mg total) by mouth 2 (two) times daily. (Patient taking differently: Take 50 mg by mouth 2 (two) times daily as needed for moderate pain. ) 60 tablet 0  . Turmeric Curcumin 500 MG CAPS Take 500 mg by mouth 2 (two) times daily.    . Wheat Dextrin (BENEFIBER PO) Take by mouth every evening. 1 tablespoon     .  zolpidem (AMBIEN) 10 MG tablet Take 10 mg by mouth at bedtime as needed for sleep.    . predniSONE (DELTASONE) 10 MG tablet 6 tablets for 2 days, then 5  for 2 days, then 66for 2 days, then 3  for 2 days, then 2 for 2 days, then 1 for 2 days (Patient not taking: Reported on 06/30/2016) 42 tablet 0    No results found for this or any previous visit (from the past 48 hour(s)). No results found.  Review of Systems  Constitutional: Negative.   HENT: Negative.   Respiratory: Negative.   Genitourinary: Negative.   Musculoskeletal: Positive for neck pain.  Skin: Negative.   Neurological: Negative.   Psychiatric/Behavioral: Negative.     Blood pressure (!) 170/72, pulse 62, temperature 98.1 F (36.7 C), temperature  source Oral, resp. rate 18, height 5' (1.524 m), weight 172 lb 14.4 oz (78.4 kg), SpO2 100 %. Physical Exam  Constitutional: She is oriented to person, place, and time. She appears well-developed.  Eyes: EOM are normal. Pupils are equal, round, and reactive to light.  Neck: Normal range of motion.  Respiratory: No respiratory distress.  GI: She exhibits no distension.  Musculoskeletal:   Ortho Exam patient has bilateral brachioplexus tenderness. Upper extremity reflexes are 1+ and symmetrical. Flexor profundus both hands and interossei are normal. No wrist or finger extension weakness. Decreased sensation T1 dermatome. Deltoids the biceps triceps supination pronation bilaterally is strong. Lower extremity show intact anterior tib EHL gastrocsoleus is strong. Well-healed lumbar incision. No sensory deficit in the lower extremities no lower extremity hyperreflexia.  Neurological: She is alert and oriented to person, place, and time.  Skin: Skin is warm and dry.  Psychiatric: She has a normal mood and affect.     Assessment/Plan C7-T1 HNP/stenosis  We'll proceed with C7-T1 Anterior Cervical Discectomy and Fusion, Allograft, Plate as scheduled. Surgical procedure along potential risks and palpitations discussed. All questions answered.Benjiman Core, PA-C 09/07/2016, 12:05 PM

## 2016-09-07 NOTE — Op Note (Signed)
Preop diagnosis: Cervical spondylosis C7-T1 with and disc protrusion and focal myelomalacia.  Postop diagnosis: Same  Procedure: C7-T1 anterior cervical discectomy and fusion, allograft and plate.  Surgeon Rodell Perna M.D.   Asst. Benjiman Core PA-C medically necessary and present for the entire procedure   anesthesia : general +6 mL Marcaine local  Drains: One Hemovac neck.  Procedure after standard prepping draping vancomycin preoperatively due to penicillin allergy timeout procedure arms tucked to the side with wrist restraints had halter traction without weight Center prepping with DuraPrep there squared with towels once the DuraPrep was dry timeout procedure was done. Steri-Drape applied thyroid sheets drapes sterile Mayo stand at the head. Sterile skin marker been used on the skin. Incision was made low 1 fingerbreadth above the clavicle starting at the midline extending to the left. Bovie was used to platysma was mobilized. Dissection down the midline was performed with care. Spurs were noted at C6-7 corresponding with the x-ray and initially a short 25 needle was placed. During the procedure we had 2 different radiology techs work on C-arm imaging with combinations of lateral views, oblique views, AP views of and coned-down views. Multiple images were sent down the radiologist with 2 series of shots of 3 or 4 each time. The level below the spurs at C6-7 did not have significant spurring this is felt to represent C7-T1. It was 2 levels down from the spondylitic changes at C5-6 at the uncovertebral joints which were about well visualized on AP x-ray with sclerosis at the endplates.  Discectomy was performed with a scalpel blade once self-retaining retractors were placed. Initially we started with the Cloward retractors tried different substance which to the shadow line retractor. Operative microscope was draped and brought in and after the third series with the confirmation by radiologist  discectomy was performed. The inferior endplate of C7 was removed about 2 mm so we could have visualization. In plate was curetted chunks of disc were removed. Trial sizing showed a 9 mm graft fit snugly. Had all traction was applied is a graft was countersunk and some surgery flow was used in the epidural space removed prior to placing the graft.  Was countersunk 1-2 mm and then 14 mm plate was selected filled with 12 mm screws which was a skyline plate of 12 mm screws 4. Graft placement was corticocancellous 9 mm and was a VG2 graft. Repeat x-rays were taken AP showed no nicely the position as as expected the lateral and oblique views did not visualize the plate and screws. Screws were locked in with a locking screws Imovax place in and out technique in line with skin incision platysma closed with 3-0 Vicryl. For Vicryl subcuticular closure tincture benzoin Marcaine infiltration postop dressing and soft cervical collar.

## 2016-09-07 NOTE — Interval H&P Note (Signed)
History and Physical Interval Note:  09/07/2016 12:31 PM  Christina Booker  has presented today for surgery, with the diagnosis of C7-T1 Stenosis  The various methods of treatment have been discussed with the patient and family. After consideration of risks, benefits and other options for treatment, the patient has consented to  Procedure(s): C7-T1 Anterior Cervical Discectomy and Fusion, Allograft, Plate (N/A) as a surgical intervention .  The patient's history has been reviewed, patient examined, no change in status, stable for surgery.  I have reviewed the patient's chart and labs.  Questions were answered to the patient's satisfaction.     Marybelle Killings

## 2016-09-07 NOTE — Transfer of Care (Signed)
Immediate Anesthesia Transfer of Care Note  Patient: Christina Booker  Procedure(s) Performed: Procedure(s): C7-T1 Anterior Cervical Discectomy and Fusion, Allograft, Plate (N/A)  Patient Location: PACU  Anesthesia Type:General  Level of Consciousness: awake, drowsy and patient cooperative  Airway & Oxygen Therapy: Patient Spontanous Breathing and Patient connected to face mask oxygen  Post-op Assessment: Report given to RN and Post -op Vital signs reviewed and stable  Post vital signs: Reviewed and stable  Last Vitals:  Vitals:   09/07/16 1059  BP: (!) 170/72  Pulse: 62  Resp: 18  Temp: 36.7 C    Last Pain:  Vitals:   09/07/16 1059  TempSrc: Oral  PainSc:       Patients Stated Pain Goal: 1 (09/64/38 3818)  Complications: No apparent anesthesia complications

## 2016-09-07 NOTE — OR Nursing (Signed)
Dr. Jeralyn Ruths called and confirmed C6-7 location , Dr. Lorin Mercy informed.

## 2016-09-07 NOTE — Anesthesia Procedure Notes (Addendum)
Procedure Name: Intubation Date/Time: 09/07/2016 1:01 PM Performed by: Merdis Delay Pre-anesthesia Checklist: Patient identified, Emergency Drugs available, Suction available, Patient being monitored and Timeout performed Patient Re-evaluated:Patient Re-evaluated prior to inductionOxygen Delivery Method: Circle system utilized Preoxygenation: Pre-oxygenation with 100% oxygen Intubation Type: IV induction Ventilation: Mask ventilation without difficulty and Oral airway inserted - appropriate to patient size Laryngoscope Size: Glidescope and 3 Grade View: Grade I Tube type: Oral Tube size: 7.5 mm Number of attempts: 1 Airway Equipment and Method: Stylet Placement Confirmation: ETT inserted through vocal cords under direct vision,  positive ETCO2 and breath sounds checked- equal and bilateral Secured at: 21 cm Dental Injury: Teeth and Oropharynx as per pre-operative assessment  Difficulty Due To: Difficulty was anticipated Comments: Yates requested glidescope 2/2 cervical cord compression

## 2016-09-08 ENCOUNTER — Encounter (HOSPITAL_COMMUNITY): Payer: Self-pay | Admitting: General Practice

## 2016-09-08 DIAGNOSIS — M5013 Cervical disc disorder with radiculopathy, cervicothoracic region: Secondary | ICD-10-CM | POA: Diagnosis not present

## 2016-09-08 NOTE — Discharge Instructions (Signed)
Use extra collar for showering with saran wrap around collar. Dry off then reapply dry collar. Collar stays on at all times. See Dr. Lorin Mercy in one week.

## 2016-09-08 NOTE — Progress Notes (Signed)
   Subjective: 1 Day Post-Op Procedure(s) (LRB): C7-T1 Anterior Cervical Discectomy and Fusion, Allograft, Plate (N/A) Patient reports pain as moderate in low back. No neck pain.    Objective: Vital signs in last 24 hours: Temp:  [97.4 F (36.3 C)-98.2 F (36.8 C)] 97.6 F (36.4 C) (06/28 0449) Pulse Rate:  [62-90] 64 (06/28 0449) Resp:  [13-21] 15 (06/28 0449) BP: (120-179)/(64-89) 120/64 (06/28 0449) SpO2:  [93 %-100 %] 97 % (06/28 0449) Weight:  [172 lb 14.4 oz (78.4 kg)] 172 lb 14.4 oz (78.4 kg) (06/27 1059)  Intake/Output from previous day: 06/27 0701 - 06/28 0700 In: 1879.2 [P.O.:150; I.V.:1729.2] Out: 78 [Emesis/NG output:3; Blood:75] Intake/Output this shift: No intake/output data recorded.  No results for input(s): HGB in the last 72 hours. No results for input(s): WBC, RBC, HCT, PLT in the last 72 hours. No results for input(s): NA, K, CL, CO2, BUN, CREATININE, GLUCOSE, CALCIUM in the last 72 hours. No results for input(s): LABPT, INR in the last 72 hours.  Neurologically intact Dg Cervical Spine Complete  Result Date: 09/07/2016 CLINICAL DATA:  Cervical spine fusion. EXAM: CERVICAL SPINE - COMPLETE 4+ VIEW COMPARISON:  None. FINDINGS: C7-T1 anterior fusion. Hardware appears to be intact on AP view. Hardware not identified on lateral view due to technique. Anatomic alignment . IMPRESSION: C7-T1 anterior fusion. Electronically Signed   By: Marcello Moores  Register   On: 09/07/2016 15:26   Dg Cervical Spine Complete  Addendum Date: 09/07/2016   ADDENDUM REPORT: 09/07/2016 14:29 ADDENDUM: Original report by Dr. Register. Addendum by Dr. Jeralyn Ruths: Two additional lateral intraoperative spot fluoroscopic images of the cervical spine were added to the original set of images. Hemostats on the image labeled #6 (2:00 pm) are obscured by the patient's shoulders. Hemostats on the image labeled #7 (1:59 pm) project anterior to the C6-7 disc space level. These additional findings were called to  the operating room at 2:15 p.m. Electronically Signed   By: Logan Bores M.D.   On: 09/07/2016 14:29   Result Date: 09/07/2016 CLINICAL DATA:  Cervical spine surgery. EXAM: CERVICAL SPINE - COMPLETE 4+ VIEW COMPARISON:  04/27/2016. FINDINGS: Postsurgical changes cervical spine. Numbering on the lateral view is difficult due to technique. On AP view metallic marker and hemostat projected over the C7-T1 level. IMPRESSION: Cervical spine intraoperative images as above. Report phoned to operating room at the time of the study. Electronically Signed: ByMarcello Moores  Register On: 09/07/2016 13:38   Dg C-arm 1-60 Min  Result Date: 09/07/2016 CLINICAL DATA:  Cervical spine fusion. EXAM: DG C-ARM 61-120 MIN COMPARISON:  None. FINDINGS: Fluoroscopic time was 0 minutes 17 seconds. Three initial images obtained. I labeled the anterior image. 2 additional lateral images were obtained following initial evaluation. These were labeled in surgery. Anatomic alignment appears present on the lateral view. It is recommended that a standard AP and lateral cervical spine series be obtained for confirmation . C7-T1 fusion . IMPRESSION: C7-T1 fusion. Electronically Signed   By: Marcello Moores  Register   On: 09/07/2016 16:07    Assessment/Plan: 1 Day Post-Op Procedure(s) (LRB): C7-T1 Anterior Cervical Discectomy and Fusion, Allograft, Plate (N/A) Plan: discharge home office one week. OK to shower with extra collar wrapped in saran wrap.   Christina Booker 09/08/2016, 7:25 AM

## 2016-09-08 NOTE — Plan of Care (Signed)
Problem: Safety: Goal: Ability to remain free from injury will improve Patient's call bell and personal belongings are within reach. Patient voices understanding to call for assistance.

## 2016-09-08 NOTE — Care Management Obs Status (Signed)
[place] NOTIFICATION   Patient Details  Name: Christina Booker MRN: [id number] Date of Birth: 1940-11-08   Medicare Observation Status Notification Given:  Yes    Ninfa Meeker, RN 09/08/2016, 12:41 PM

## 2016-09-09 ENCOUNTER — Encounter (HOSPITAL_COMMUNITY): Payer: Self-pay | Admitting: *Deleted

## 2016-09-09 ENCOUNTER — Emergency Department (HOSPITAL_COMMUNITY)
Admission: EM | Admit: 2016-09-09 | Discharge: 2016-09-09 | Disposition: A | Discharge status: Home / Self Care | Payer: PPO | Attending: Emergency Medicine | Admitting: Emergency Medicine

## 2016-09-09 ENCOUNTER — Telehealth (INDEPENDENT_AMBULATORY_CARE_PROVIDER_SITE_OTHER): Payer: Self-pay

## 2016-09-09 DIAGNOSIS — G8918 Other acute postprocedural pain: Secondary | ICD-10-CM | POA: Insufficient documentation

## 2016-09-09 DIAGNOSIS — Z8673 Personal history of transient ischemic attack (TIA), and cerebral infarction without residual deficits: Secondary | ICD-10-CM | POA: Insufficient documentation

## 2016-09-09 DIAGNOSIS — Z87891 Personal history of nicotine dependence: Secondary | ICD-10-CM | POA: Diagnosis not present

## 2016-09-09 DIAGNOSIS — Z7982 Long term (current) use of aspirin: Secondary | ICD-10-CM | POA: Diagnosis not present

## 2016-09-09 DIAGNOSIS — R51 Headache: Secondary | ICD-10-CM | POA: Insufficient documentation

## 2016-09-09 DIAGNOSIS — I10 Essential (primary) hypertension: Secondary | ICD-10-CM | POA: Diagnosis not present

## 2016-09-09 DIAGNOSIS — R519 Headache, unspecified: Secondary | ICD-10-CM

## 2016-09-09 MED ORDER — HYDROMORPHONE HCL 1 MG/ML IJ SOLN
0.5000 mg | Freq: Once | INTRAMUSCULAR | Status: AC
  Administered 2016-09-09: 0.5 mg via INTRAVENOUS
  Filled 2016-09-09: qty 0.5

## 2016-09-09 MED ORDER — SODIUM CHLORIDE 0.9 % IV BOLUS (SEPSIS)
500.0000 mL | Freq: Once | INTRAVENOUS | Status: AC
  Administered 2016-09-09: 500 mL via INTRAVENOUS

## 2016-09-09 MED ORDER — PROMETHAZINE HCL 12.5 MG PO TABS: 12.5000 mg | ORAL_TABLET | Freq: Four times a day (QID) | ORAL | Status: DC | PRN

## 2016-09-09 MED ORDER — LORAZEPAM 2 MG/ML IJ SOLN
0.5000 mg | Freq: Once | INTRAMUSCULAR | Status: AC
  Administered 2016-09-09: 0.5 mg via INTRAVENOUS
  Filled 2016-09-09: qty 1

## 2016-09-09 MED ORDER — KETOROLAC TROMETHAMINE 15 MG/ML IJ SOLN
15.0000 mg | Freq: Once | INTRAMUSCULAR | Status: AC
  Administered 2016-09-09: 15 mg via INTRAVENOUS
  Filled 2016-09-09: qty 1

## 2016-09-09 MED ORDER — HYDROCODONE-ACETAMINOPHEN 5-325 MG PO TABS: 1.0000 | ORAL_TABLET | ORAL | 0 refills | Status: DC | PRN

## 2016-09-09 MED ORDER — PROMETHAZINE HCL 25 MG/ML IJ SOLN
12.5000 mg | Freq: Once | INTRAMUSCULAR | Status: AC
  Administered 2016-09-09: 12.5 mg via INTRAVENOUS
  Filled 2016-09-09: qty 1

## 2016-09-09 NOTE — Anesthesia Postprocedure Evaluation (Signed)
Anesthesia Post Note  Patient: Christina Booker  Procedure(s) Performed: Procedure(s) (LRB): C7-T1 Anterior Cervical Discectomy and Fusion, Allograft, Plate (N/A)     Patient location during evaluation: PACU Anesthesia Type: General Level of consciousness: awake and alert Pain management: pain level controlled Vital Signs Assessment: post-procedure vital signs reviewed and stable Respiratory status: spontaneous breathing, nonlabored ventilation, respiratory function stable and patient connected to nasal cannula oxygen Cardiovascular status: blood pressure returned to baseline and stable Postop Assessment: no signs of nausea or vomiting Anesthetic complications: no    Last Vitals:  Vitals:   09/07/16 2100 09/08/16 0449  BP: (!) 145/67 120/64  Pulse: 69 64  Resp: 15 15  Temp: 36.3 C 36.4 C    Last Pain:  Vitals:   09/08/16 0449  TempSrc: Axillary  PainSc:                  Karyl Kinnier Aundray [place]

## 2016-09-09 NOTE — ED Notes (Signed)
Pt home stable with husband. Husband states he understands instructions. Pt home via wc with soft cer vical colar in place.

## 2016-09-09 NOTE — Discharge Instructions (Signed)
I am prescribing hydrocodone (vicodin) for pain. It may potentially make you nauseated, but hopefully not. Start with one pill but you can take up to two at a time if you feel like you need to. Take phenergan as needed for nausea. Follow-up with Dr Lorin Mercy.

## 2016-09-09 NOTE — ED Provider Notes (Signed)
[place] DEPT Provider Note   CSN: [id number] Arrival date & time: 09/09/16  9767  By signing my name below, I, Evelene Croon, attest that this documentation has been prepared under the direction and in the presence of Virgel Manifold, MD . Electronically Signed: Evelene Croon, Scribe. 09/09/2016. 10:15 AM.   History   Chief Complaint Chief Complaint  Patient presents with  . Headache    The history is provided by the patient. No language interpreter was used.    HPI Comments:  Christina Booker is a 76 y.o. female who presents to the Emergency Department complaining of "severe", constant HA since ~ 0600 this AM. Pt had a C7-T1 anterior cervical discectomy and fusion on 09/07/2016, performed by  Dr. Rodell Perna. She notes neck pain following surgery but denies HA until today. She has been taking tramadol for her pain with minimal relief. Pt denies vision changes, photophobia, fever, and acute numbness/weakness in her extremities. No h/o migraine/frequent HAs. She is not currently on any blood thinners but states she usually takes daily 81mg  ASA she just hasn't resumed taking it since her surgery.   Past Medical History:  Diagnosis Date  . Arthritis    hands, back  . Cervical spinal stenosis   . Complication of anesthesia   . Constipation   . Hot flashes   . Hyperlipidemia   . Hypertension   . Irritable bowel syndrome   . PONV (postoperative nausea and vomiting)   . Rosacea   . Stroke [facility]) 12/01/2009   tia    Patient Active Problem List   Diagnosis Date Noted  . Cervical spinal stenosis 09/07/2016  . Spinal stenosis of lumbar region without neurogenic claudication 06/01/2016  . Cervical radiculopathy 05/12/2016  . Spinal stenosis of cervical region 05/12/2016  . Cervicalgia 05/12/2016  . Ganglion of left hand 12/22/2014  . S/P lumbar fusion 04/16/2014    Past Surgical History:  Procedure Laterality Date  . ABDOMINAL HYSTERECTOMY    . ANTERIOR CERVICAL  DECOMP/DISCECTOMY FUSION N/A 09/07/2016   Procedure: C7-T1 Anterior Cervical Discectomy and Fusion, Allograft, Plate;  Surgeon: Marybelle Killings, MD;  Location: [place];  Service: Orthopedics;  Laterality: N/A;  . BACK SURGERY    . CARPAL TUNNEL RELEASE Right 2009  . CARPAL TUNNEL RELEASE Left   . CATARACT EXTRACTION, BILATERAL    . CERVICAL DISCECTOMY  09/07/2016   C7-T1 Anterior Cervical Discectomy and Fusion, Allograft, Plate (N/A)  . EYE SURGERY    . GANGLION CYST EXCISION Left 12/22/2014   Procedure: Excision Left Hand Ganglion cyst;  Surgeon: Marybelle Killings, MD;  Location: [place];  Service: Orthopedics;  Laterality: Left;  . HEMORRHOID SURGERY    . TUBAL LIGATION      OB History    No data available       Home Medications    Prior to Admission medications   Medication Sig Start Date End Date Taking? Authorizing Provider  aspirin EC 81 MG tablet Take 81 mg by mouth daily.    [provider]  atorvastatin (LIPITOR) 20 MG tablet Take 20 mg by mouth at bedtime.     [provider]  buPROPion (WELLBUTRIN XL) 150 MG 24 hr tablet Take 150 mg by mouth every morning.    [provider]  Cholecalciferol (VITAMIN D3) 1000 units CAPS Take 1,000 Units by mouth 2 (two) times daily.    [provider]  dicyclomine (BENTYL) 10 MG capsule Take 10 mg by mouth 2 (  two) times daily as needed for spasms.    [provider]  glycerin adult 2 G SUPP Place 1 suppository rectally 3 (three) times daily as needed (constipation).    [provider]  lisinopril (PRINIVIL,ZESTRIL) 5 MG tablet Take 5 mg by mouth at bedtime.     [provider]  polyethylene glycol powder (GLYCOLAX/MIRALAX) powder Take by mouth every evening. 1 tablespoon    [provider]  Polyvinyl Alcohol-Povidone (FRESHKOTE OP) Place 2 drops into both eyes daily as needed (dry eyes).    [provider]  traMADol (ULTRAM) 50 MG tablet Take 1 tablet  (50 mg total) by mouth 2 (two) times daily. Patient taking differently: Take 50 mg by mouth 2 (two) times daily as needed for moderate pain.  04/29/16   Lanae Crumbly, PA-C  Turmeric Curcumin 500 MG CAPS Take 500 mg by mouth 2 (two) times daily.    [provider]  Wheat Dextrin (BENEFIBER PO) Take by mouth every evening. 1 tablespoon     [provider]  zolpidem (AMBIEN) 10 MG tablet Take 10 mg by mouth at bedtime as needed for sleep.    [provider]    Family History Family History  Problem Relation Age of Onset  . Cancer Mother   . Uterine cancer Mother   . Hypertension Mother   . Heart attack Father     Social History Social History  Substance Use Topics  . Smoking status: Former Smoker    Quit date: 04/09/1992  . Smokeless tobacco: Never Used  . Alcohol use 2.4 oz/week    4 Glasses of wine per week     Comment: 4     Allergies   Methocarbamol; Oxycodone; and Penicillins   Review of Systems Review of Systems  Constitutional: Negative for chills and fever.  Eyes: Negative for photophobia and visual disturbance.  Respiratory: Negative for shortness of breath.   Cardiovascular: Negative for chest pain.  Musculoskeletal: Positive for neck pain.  Neurological: Positive for headaches. Negative for weakness.  All other systems reviewed and are negative.    Physical Exam Updated Vital Signs BP (!) 147/85 (BP Location: Left Arm)   Pulse 85   Temp 98.2 F (36.8 C) (Oral)   Resp 18   Ht 5' (1.524 m)   Wt 171 lb (77.6 kg)   SpO2 96%   BMI 33.40 kg/m   Physical Exam  Constitutional: She is oriented to person, place, and time. She appears well-developed and well-nourished. No distress.  HENT:  Head: Normocephalic and atraumatic.  Eyes: Conjunctivae are normal.  Neck:  Soft collar in place  Anterior surgical site bandaged and clean; no concerning skin changes Tenderness along left lateral neck  No swelling or bruising  l carotid  feels symmetric as compared to r  Cardiovascular: Normal rate.   Pulmonary/Chest: Effort normal.  Abdominal: She exhibits no distension.  Neurological: She is alert and oriented to person, place, and time.  Good grip strength bilaterally Good bicep/tricep strength bilaterally.   Skin: Skin is warm and dry.  Psychiatric: She has a normal mood and affect.  Nursing note and vitals reviewed.    ED Treatments / Results  DIAGNOSTIC STUDIES:  Oxygen Saturation is 97% on RA, normal by my interpretation.    COORDINATION OF CARE:  10:12 AM Discussed treatment plan with pt at bedside and pt agreed to plan.  Labs (all labs ordered are listed, but only abnormal results are displayed) Labs Reviewed -  No data to display  EKG  EKG Interpretation None       Radiology Dg Cervical Spine Complete  Result Date: 09/07/2016 CLINICAL DATA:  Cervical spine fusion. EXAM: CERVICAL SPINE - COMPLETE 4+ VIEW COMPARISON:  None. FINDINGS: C7-T1 anterior fusion. Hardware appears to be intact on AP view. Hardware not identified on lateral view due to technique. Anatomic alignment . IMPRESSION: C7-T1 anterior fusion. Electronically Signed   By: Marcello Moores  Register   On: 09/07/2016 15:26   Dg Cervical Spine Complete  Addendum Date: 09/07/2016   ADDENDUM REPORT: 09/07/2016 14:29 ADDENDUM: Original report by Dr. Register. Addendum by Dr. Jeralyn Ruths: Two additional lateral intraoperative spot fluoroscopic images of the cervical spine were added to the original set of images. Hemostats on the image labeled #6 (2:00 pm) are obscured by the patient's shoulders. Hemostats on the image labeled #7 (1:59 pm) project anterior to the C6-7 disc space level. These additional findings were called to the operating room at 2:15 p.m. Electronically Signed   By: Logan Bores M.D.   On: 09/07/2016 14:29   Result Date: 09/07/2016 CLINICAL DATA:  Cervical spine surgery. EXAM: CERVICAL SPINE - COMPLETE 4+ VIEW COMPARISON:  04/27/2016.  FINDINGS: Postsurgical changes cervical spine. Numbering on the lateral view is difficult due to technique. On AP view metallic marker and hemostat projected over the C7-T1 level. IMPRESSION: Cervical spine intraoperative images as above. Report phoned to operating room at the time of the study. Electronically Signed: ByMarcello Moores  Register On: 09/07/2016 13:38   Dg C-arm 1-60 Min  Result Date: 09/07/2016 CLINICAL DATA:  Cervical spine fusion. EXAM: DG C-ARM 61-120 MIN COMPARISON:  None. FINDINGS: Fluoroscopic time was 0 minutes 17 seconds. Three initial images obtained. I labeled the anterior image. 2 additional lateral images were obtained following initial evaluation. These were labeled in surgery. Anatomic alignment appears present on the lateral view. It is recommended that a standard AP and lateral cervical spine series be obtained for confirmation . C7-T1 fusion . IMPRESSION: C7-T1 fusion. Electronically Signed   By: Marcello Moores  Register   On: 09/07/2016 16:07    Procedures Procedures (including critical care time)  Medications Ordered in ED Medications - No data to display   Initial Impression / Assessment and Plan / ED Course  I have reviewed the triage vital signs and the nursing notes.  Pertinent labs & imaging results that were available during my care of the patient were reviewed by me and considered in my medical decision making (see chart for details).  Clinical Course as of Sep 09 1124  Fri Sep 09, 2016  1124 She is all drugged up but feeling much better. She feels comfortable going home. Expresses her appreciation. Reiterated return precautions. Advised to take vicodin or tramadol but not both concurrently and that phenergan can also make her drowsy so to be careful.   [SK]    Clinical Course User Index [SK] Virgel Manifold, MD    75yF with headache. She had anterior c7/t1 discectomy two days ago. I do not think there is a significant complication from the procedure itself but  more likely simple delayed onset soreness. Timing to onset of symptoms fits with this.   Explained to patient that to obtain access to her cervical spine that their is obviously some cutting and that the structures in her neck have to be retracted. Much like after a car accident there can be pain/soreness which is delayed in onset. I think her headache is actually muscular in origin. She  has no new neuro complaints. She is afebrile. Surgical site itself looks good. Her UE strength is good. I'm not concerned about her airway. No exam findings to suggest vascular injury. I doubt other urgent or emergent causes such as bleed, acute angle closure glaucoma, meningitis, temporal arteritis, CO poisoning etc.   My plan is to treat her pain and nausea in the ED to get it under better control. She says Dr Lorin Mercy offered her something aside from tramadol but she declined since she had good relief with it after previous procedures. I don't mind prescribing her something else as well as some phenergan. She says this seemed to work best for her nausea post-op.   Return precautions discussed. Post-op recommendations and follow-up per Dr Lorin Mercy otherwise.   Final Clinical Impressions(s) / ED Diagnoses   Final diagnoses:  Post-operative pain  Nonintractable headache, unspecified chronicity pattern, unspecified headache type    New Prescriptions New Prescriptions   No medications on file   I personally preformed the services scribed in my presence. The recorded information has been reviewed is accurate. Virgel Manifold, MD.     Virgel Manifold, MD 09/09/16 [phone]

## 2016-09-09 NOTE — ED Triage Notes (Signed)
Pt c/o Headache onset at 6am, pt had recent disc surgery by Lorin Mercy, MD, pt discharged from here yesterday, pt in soft collar upon arrival to ED, denies sensitivity to light, denies blurred vision, pt A&O x4

## 2016-09-09 NOTE — Telephone Encounter (Signed)
FYI-  Patient husband called wanting to let Dr. Lorin Mercy know that patient is home now from the hospital and everything is taken care of and she is doing okay.  CB# [phone].

## 2016-09-12 NOTE — Telephone Encounter (Signed)
I called . She was seen in ER  And felt to have some anxiety. She is home and doing well with no problems. I called her and discussed. She will call if she has any problems.

## 2016-09-12 NOTE — Telephone Encounter (Signed)
FYI

## 2016-09-12 NOTE — Discharge Summary (Signed)
Patient ID: Christina Booker MRN: [id number] DOB/AGE: Feb 07, 1941 76 y.o.  Admit date: 09/07/2016 Discharge date: 09/12/2016  Admission Diagnoses:  Active Problems:   Cervical spinal stenosis   Discharge Diagnoses:  Active Problems:   Cervical spinal stenosis  status post Procedure(s): C7-T1 Anterior Cervical Discectomy and Fusion, Allograft, Plate  Past Medical History:  Diagnosis Date  . Arthritis    hands, back  . Cervical spinal stenosis   . Complication of anesthesia   . Constipation   . Hot flashes   . Hyperlipidemia   . Hypertension   . Irritable bowel syndrome   . PONV (postoperative nausea and vomiting)   . Rosacea   . Stroke [facility]) 12/01/2009   tia    Surgeries: Procedure(s): C7-T1 Anterior Cervical Discectomy and Fusion, Allograft, Plate on 03/16/7251   Consultants:   Discharged Condition: Improved  Hospital Course: Christina Booker is an 76 y.o. female who was admitted 09/07/2016 for operative treatment of cervical HNP. Patient failed conservative treatments (please see the history and physical for the specifics) and had severe unremitting pain that affects sleep, daily activities and work/hobbies. After pre-op clearance, the patient was taken to the operating room on 09/07/2016 and underwent  Procedure(s): C7-T1 Anterior Cervical Discectomy and Fusion, Allograft, Plate.    Patient was given perioperative antibiotics:  Anti-infectives    Start     Dose/Rate Route Frequency Ordered Stop   09/07/16 1045  vancomycin (VANCOCIN) IVPB 1000 mg/200 mL premix  Status:  Discontinued     1,000 mg 200 mL/hr over 60 Minutes Intravenous To ShortStay Surgical 09/07/16 1034 09/07/16 1856   09/07/16 1037  vancomycin (VANCOCIN) 1-5 GM/200ML-% IVPB    Comments:  Laurita Quint   : cabinet override      09/07/16 1037 09/07/16 1756       Patient was given sequential compression devices and early ambulation to prevent DVT.   Patient benefited maximally from hospital stay  and there were no complications. At the time of discharge, the patient was urinating/moving their bowels without difficulty, tolerating a regular diet, pain is controlled with oral pain medications and they have been cleared by PT/OT.   Recent vital signs: No data found.    Recent laboratory studies: No results for input(s): WBC, HGB, HCT, PLT, NA, K, CL, CO2, BUN, CREATININE, GLUCOSE, INR, CALCIUM in the last 72 hours.  Invalid input(s): PT, 2   Discharge Medications:   Allergies as of 09/08/2016      Reactions   Methocarbamol Nausea Only   Oxycodone Nausea Only   Penicillins Rash   Has patient had a PCN reaction causing immediate rash, facial/tongue/throat swelling, SOB or lightheadedness with hypotension: Yes Has patient had a PCN reaction causing severe rash involving mucus membranes or skin necrosis: No Has patient had a PCN reaction that required hospitalization: No Has patient had a PCN reaction occurring within the last 10 years: No If all of the above answers are "NO", then may proceed with Cephalosporin use.      Medication List    STOP taking these medications   predniSONE 10 MG tablet Commonly known as:  DELTASONE     TAKE these medications   aspirin EC 81 MG tablet Take 81 mg by mouth daily.   atorvastatin 20 MG tablet Commonly known as:  LIPITOR Take 20 mg by mouth at bedtime.   BENEFIBER PO Take by mouth every evening. 1 tablespoon   buPROPion 150 MG 24 hr tablet Commonly known as:  WELLBUTRIN XL Take 150 mg by mouth every morning.   dicyclomine 10 MG capsule Commonly known as:  BENTYL Take 10 mg by mouth 2 (two) times daily as needed for spasms.   FRESHKOTE OP Place 2 drops into both eyes daily as needed (dry eyes).   glycerin adult 2 g Supp Place 1 suppository rectally 3 (three) times daily as needed (constipation).   lisinopril 5 MG tablet Commonly known as:  PRINIVIL,ZESTRIL Take 5 mg by mouth at bedtime.   polyethylene glycol powder  powder Commonly known as:  GLYCOLAX/MIRALAX Take by mouth every evening. 1 tablespoon   traMADol 50 MG tablet Commonly known as:  ULTRAM Take 1 tablet (50 mg total) by mouth 2 (two) times daily. What changed:  when to take this  reasons to take this   Turmeric Curcumin 500 MG Caps Take 500 mg by mouth 2 (two) times daily.   Vitamin D3 1000 units Caps Take 1,000 Units by mouth 2 (two) times daily.   zolpidem 10 MG tablet Commonly known as:  AMBIEN Take 10 mg by mouth at bedtime as needed for sleep.       Diagnostic Studies: Dg Cervical Spine Complete  Result Date: 09/07/2016 CLINICAL DATA:  Cervical spine fusion. EXAM: CERVICAL SPINE - COMPLETE 4+ VIEW COMPARISON:  None. FINDINGS: C7-T1 anterior fusion. Hardware appears to be intact on AP view. Hardware not identified on lateral view due to technique. Anatomic alignment . IMPRESSION: C7-T1 anterior fusion. Electronically Signed   By: Marcello Moores  Register   On: 09/07/2016 15:26   Dg Cervical Spine Complete  Addendum Date: 09/07/2016   ADDENDUM REPORT: 09/07/2016 14:29 ADDENDUM: Original report by Dr. Register. Addendum by Dr. Jeralyn Ruths: Two additional lateral intraoperative spot fluoroscopic images of the cervical spine were added to the original set of images. Hemostats on the image labeled #6 (2:00 pm) are obscured by the patient's shoulders. Hemostats on the image labeled #7 (1:59 pm) project anterior to the C6-7 disc space level. These additional findings were called to the operating room at 2:15 p.m. Electronically Signed   By: Logan Bores M.D.   On: 09/07/2016 14:29   Result Date: 09/07/2016 CLINICAL DATA:  Cervical spine surgery. EXAM: CERVICAL SPINE - COMPLETE 4+ VIEW COMPARISON:  04/27/2016. FINDINGS: Postsurgical changes cervical spine. Numbering on the lateral view is difficult due to technique. On AP view metallic marker and hemostat projected over the C7-T1 level. IMPRESSION: Cervical spine intraoperative images as above. Report  phoned to operating room at the time of the study. Electronically Signed: ByMarcello Moores  Register On: 09/07/2016 13:38   Dg C-arm 1-60 Min  Result Date: 09/07/2016 CLINICAL DATA:  Cervical spine fusion. EXAM: DG C-ARM 61-120 MIN COMPARISON:  None. FINDINGS: Fluoroscopic time was 0 minutes 17 seconds. Three initial images obtained. I labeled the anterior image. 2 additional lateral images were obtained following initial evaluation. These were labeled in surgery. Anatomic alignment appears present on the lateral view. It is recommended that a standard AP and lateral cervical spine series be obtained for confirmation . C7-T1 fusion . IMPRESSION: C7-T1 fusion. Electronically Signed   By: [place]   On: 09/07/2016 16:07      Follow-up Information    Marybelle Killings, MD Follow up in 1 week(s).   Specialty:  Orthopedic Surgery Contact information: [place] [address] [phone]           Discharge Plan:  discharge to home  Disposition:     Signed: Jeneen Rinks  Ricard Dillon  09/12/2016, 12:32 PM

## 2016-09-15 ENCOUNTER — Telehealth (INDEPENDENT_AMBULATORY_CARE_PROVIDER_SITE_OTHER): Payer: Self-pay | Admitting: Orthopaedic Surgery

## 2016-09-15 NOTE — Telephone Encounter (Signed)
Patient called advised she is getting TMJ on right side of her jaw. Patient asked if she can sleep on her side with the neck brace on. Patient said there bandage did come off and she replaced the bandage. Patient asked how long should she wear the neck brace? The number to contact patient is [phone]

## 2016-09-15 NOTE — Telephone Encounter (Signed)
Please advise 

## 2016-09-15 NOTE — Telephone Encounter (Signed)
IC patient and advised.  

## 2016-09-15 NOTE — Telephone Encounter (Signed)
Collar for 6 wks. From surgery. She has appt for ROV at 6 wks post op then we do xrays.  She can sleep anyway she wants with collar on. ucall thanks

## 2016-09-20 ENCOUNTER — Ambulatory Visit (INDEPENDENT_AMBULATORY_CARE_PROVIDER_SITE_OTHER): Payer: PPO | Admitting: Orthopaedic Surgery

## 2016-09-20 ENCOUNTER — Ambulatory Visit (INDEPENDENT_AMBULATORY_CARE_PROVIDER_SITE_OTHER): Payer: PPO

## 2016-09-20 ENCOUNTER — Encounter (INDEPENDENT_AMBULATORY_CARE_PROVIDER_SITE_OTHER): Payer: Self-pay | Admitting: Orthopaedic Surgery

## 2016-09-20 VITALS — BP 130/79 | HR 67

## 2016-09-20 DIAGNOSIS — Z981 Arthrodesis status: Secondary | ICD-10-CM

## 2016-09-20 MED ORDER — HYDROCODONE-ACETAMINOPHEN 5-325 MG PO TABS: 1.0000 | ORAL_TABLET | ORAL | 0 refills | Status: DC | PRN

## 2016-09-20 NOTE — Progress Notes (Signed)
Post-Op Visit Note   Patient: Christina Booker           Date of Birth: Jun 14, 1940           MRN: [id number] Visit Date: 09/20/2016 PCP: Raina Mina., MD   Assessment & Plan: Status post C7-T1 ACDF when she had some cord myelomalacia present. She has L3-4  anterolisthesis with facet cyst causing severe stenosis above or solid L4-5 fusion. She will return in 4 weeks for flexion-extension lateral C-spine swimmer x-ray views and then we can discuss scheduling her lumbar surgery.  Chief Complaint:  Chief Complaint  Patient presents with  . Neck - Routine Post Op, Follow-up   Visit Diagnoses:  1. S/P cervical spinal fusion     Plan: Return visit 4 weeks swimmer's lateral cervical spine flexion extension x-rays  Follow-Up Instructions: No Follow-up on file.   Orders:  Orders Placed This Encounter  Procedures  . XR Cervical Spine 2 or 3 views   Meds ordered this encounter  Medications  . HYDROcodone-acetaminophen (NORCO/VICODIN) 5-325 MG tablet    Sig: Take 1-2 tablets by mouth every 4 (four) hours as needed.    Dispense:  20 tablet    Refill:  0    Imaging: Xr Cervical Spine 2 Or 3 Views  Result Date: 09/20/2016 AP and lateral C-spine x-rays demonstrate the C7-T1 disc ACDF with plate. Good position and alignment. Impression status post C7-T1 cervical fusion with allograft and plate in good position. She has some degenerative spurring in the mid cervical spine unchanged from previous radiographs.   PMFS History: Patient Active Problem List   Diagnosis Date Noted  . Cervical spinal stenosis 09/07/2016  . Spinal stenosis of lumbar region without neurogenic claudication 06/01/2016  . Cervical radiculopathy 05/12/2016  . Spinal stenosis of cervical region 05/12/2016  . Cervicalgia 05/12/2016  . Ganglion of left hand 12/22/2014  . S/P lumbar fusion 04/16/2014   Past Medical History:  Diagnosis Date  . Arthritis    hands, back  . Cervical spinal stenosis   .  Complication of anesthesia   . Constipation   . Hot flashes   . Hyperlipidemia   . Hypertension   . Irritable bowel syndrome   . PONV (postoperative nausea and vomiting)   . Rosacea   . Stroke [facility]) 12/01/2009   tia    Family History  Problem Relation Age of Onset  . Cancer Mother   . Uterine cancer Mother   . Hypertension Mother   . Heart attack Father     Past Surgical History:  Procedure Laterality Date  . ABDOMINAL HYSTERECTOMY    . ANTERIOR CERVICAL DECOMP/DISCECTOMY FUSION N/A 09/07/2016   Procedure: C7-T1 Anterior Cervical Discectomy and Fusion, Allograft, Plate;  Surgeon: Marybelle Killings, MD;  Location: [place];  Service: Orthopedics;  Laterality: N/A;  . BACK SURGERY    . CARPAL TUNNEL RELEASE Right 2009  . CARPAL TUNNEL RELEASE Left   . CATARACT EXTRACTION, BILATERAL    . CERVICAL DISCECTOMY  09/07/2016   C7-T1 Anterior Cervical Discectomy and Fusion, Allograft, Plate (N/A)  . EYE SURGERY    . GANGLION CYST EXCISION Left 12/22/2014   Procedure: Excision Left Hand Ganglion cyst;  Surgeon: Marybelle Killings, MD;  Location: [place];  Service: Orthopedics;  Laterality: Left;  . HEMORRHOID SURGERY    . TUBAL LIGATION     Social History   Occupational History  . Not on file.   Social History Main Topics  .  Smoking status: Former Smoker    Quit date: 04/09/1992  . Smokeless tobacco: Never Used  . Alcohol use 2.4 oz/week    4 Glasses of wine per week     Comment: 4  . Drug use: No  . Sexual activity: Not on file

## 2016-09-24 ENCOUNTER — Other Ambulatory Visit (INDEPENDENT_AMBULATORY_CARE_PROVIDER_SITE_OTHER): Payer: Self-pay | Admitting: Surgery

## 2016-09-26 ENCOUNTER — Telehealth (INDEPENDENT_AMBULATORY_CARE_PROVIDER_SITE_OTHER): Payer: Self-pay

## 2016-09-26 MED ORDER — TRAMADOL HCL 50 MG PO TABS: ORAL_TABLET | ORAL | 0 refills | Status: DC

## 2016-09-26 NOTE — Telephone Encounter (Signed)
noted 

## 2016-09-26 NOTE — Telephone Encounter (Signed)
Ok for # 30 tabs. She had surgery one week ago, anxiety etc .   Does not need more than #30.    1 po tid prn pain . ucall thanks

## 2016-09-26 NOTE — Telephone Encounter (Signed)
I called and discussed she will try tylenol. She will call me at 5 wks post op and we will start working on surgery pre-cert for lumbar surgery so she can get it at 7 wks post neck surgery. FYI

## 2016-09-26 NOTE — Telephone Encounter (Signed)
Patient called stating that Hydrocodone is making her nauseous and would like another Rx for pain.  She would like a Rx that is non-narcotic.  CB# is [phone].  Please advise.

## 2016-09-26 NOTE — Telephone Encounter (Signed)
Patient does not feel like the tramadol is really helping either.  She would like to know if she can try Celebrex as that helped with a problem she had before, or if she could just try tylenol. Nothing is really touching the pain that she is having.  Please advise.

## 2016-09-26 NOTE — Telephone Encounter (Signed)
Ok for refill? 

## 2016-09-28 ENCOUNTER — Telehealth (INDEPENDENT_AMBULATORY_CARE_PROVIDER_SITE_OTHER): Payer: Self-pay | Admitting: Radiology

## 2016-09-28 NOTE — Telephone Encounter (Signed)
Patient's husband left voicemail stating the only thing that seemed to be helping her was ice. He would like to know if you can write a script for her to have the wrap around knee sleeve with ice pack holders that some patient's receive post operatively and have someone bring to then to show them how to use.  Call back number [phone]

## 2016-09-29 ENCOUNTER — Telehealth (INDEPENDENT_AMBULATORY_CARE_PROVIDER_SITE_OTHER): Payer: Self-pay | Admitting: Orthopaedic Surgery

## 2016-09-29 MED ORDER — PROMETHAZINE HCL 12.5 MG PO TABS: 12.5000 mg | ORAL_TABLET | Freq: Four times a day (QID) | ORAL | Status: DC | PRN

## 2016-09-29 NOTE — Telephone Encounter (Signed)
Entered into system. Have attempted to reach patient several times today, phone rings busy.

## 2016-09-29 NOTE — Telephone Encounter (Signed)
OK - thanks

## 2016-09-29 NOTE — Telephone Encounter (Signed)
Ok to refill Promethazine?

## 2016-09-29 NOTE — Telephone Encounter (Signed)
I tried to reach patient, unable. Will try again.

## 2016-09-29 NOTE — Telephone Encounter (Signed)
Phone off the hook. Not sure if this is pain in knee from lumbar or she wants it for her neck. They can get bag of frozen peas or frozen corn and swap out when it melts for another bag.  Cannot reach them tried times 4. ucall thanks

## 2016-09-29 NOTE — Telephone Encounter (Signed)
Patient called needing Rx refilled (Tromethazine 12.5 mg) Tabs  The number to contact patient is [phone]

## 2016-10-03 ENCOUNTER — Telehealth (INDEPENDENT_AMBULATORY_CARE_PROVIDER_SITE_OTHER): Payer: Self-pay

## 2016-10-03 NOTE — Telephone Encounter (Signed)
Fyi.  Please see below. Patient was unsure if you felt you needed to see her back. I made her the appt, but advised if you thought differently we would call her and let her know. I did advise patient to continue to lie flat, head on one pillow, except for meals and bathroom breaks to see if that made a difference in her pain.

## 2016-10-03 NOTE — Telephone Encounter (Signed)
Patient called stating that she has a bad headache.  Took a Vicodin due to pain being so bad.  Would like a call back concerning this issue.  CB# [phone]. Please advise.

## 2016-10-03 NOTE — Telephone Encounter (Signed)
I called and spoke with patient. She states that she has had a slight headache ever since surgery, but today it is extremely bad. She has had to take vicodin for the pain with slight relief. She is having to stay in the bed. Patient does notice pain is less if she is lying down on ice pack.   I advised Dr. Lorin Mercy is in surgery this afternoon and that I would send him this message. I made patient an appointment at 3:00pm tomorrow afternoon for recheck.

## 2016-10-03 NOTE — Telephone Encounter (Signed)
Unable to reach patient. Phone rings busy. Will advise if she calls back.

## 2016-10-04 ENCOUNTER — Ambulatory Visit (INDEPENDENT_AMBULATORY_CARE_PROVIDER_SITE_OTHER): Payer: PPO | Admitting: Orthopaedic Surgery

## 2016-10-04 VITALS — BP 143/97 | HR 85

## 2016-10-04 DIAGNOSIS — Z981 Arthrodesis status: Secondary | ICD-10-CM

## 2016-10-04 DIAGNOSIS — M48062 Spinal stenosis, lumbar region with neurogenic claudication: Secondary | ICD-10-CM

## 2016-10-04 MED ORDER — HYDROCODONE-ACETAMINOPHEN 5-325 MG PO TABS: 1.0000 | ORAL_TABLET | ORAL | 0 refills | Status: DC | PRN

## 2016-10-04 NOTE — Telephone Encounter (Signed)
Has appt

## 2016-10-04 NOTE — Progress Notes (Deleted)
   Office Visit Note   Patient: Christina Booker           Date of Birth: 1940-07-31           MRN: [id number] Visit Date: 10/04/2016              Requested by: Raina Mina., MD [address] [place], [place] [address] PCP: Raina Mina., MD   Assessment & Plan: Visit Diagnoses: No diagnosis found.  Plan: ***  Follow-Up Instructions: No Follow-up on file.   Orders:  No orders of the defined types were placed in this encounter.  No orders of the defined types were placed in this encounter.     Procedures: No procedures performed   Clinical Data: No additional findings.   Subjective: Chief Complaint  Patient presents with  . Neck - Pain, Routine Post Op    HPI  Review of Systems   Objective: Vital Signs: BP (!) 143/97   Pulse 85   Physical Exam  Ortho Exam  Specialty Comments:  No specialty comments available.  Imaging: No results found.   PMFS History: Patient Active Problem List   Diagnosis Date Noted  . Cervical spinal stenosis 09/07/2016  . Spinal stenosis of lumbar region without neurogenic claudication 06/01/2016  . Cervical radiculopathy 05/12/2016  . Spinal stenosis of cervical region 05/12/2016  . Cervicalgia 05/12/2016  . Ganglion of left hand 12/22/2014  . S/P lumbar fusion 04/16/2014   Past Medical History:  Diagnosis Date  . Arthritis    hands, back  . Cervical spinal stenosis   . Complication of anesthesia   . Constipation   . Hot flashes   . Hyperlipidemia   . Hypertension   . Irritable bowel syndrome   . PONV (postoperative nausea and vomiting)   . Rosacea   . Stroke [facility]) 12/01/2009   tia    Family History  Problem Relation Age of Onset  . Cancer Mother   . Uterine cancer Mother   . Hypertension Mother   . Heart attack Father     Past Surgical History:  Procedure Laterality Date  . ABDOMINAL HYSTERECTOMY    . ANTERIOR CERVICAL DECOMP/DISCECTOMY FUSION N/A 09/07/2016   Procedure: C7-T1 Anterior Cervical  Discectomy and Fusion, Allograft, Plate;  Surgeon: Marybelle Killings, MD;  Location: [place];  Service: Orthopedics;  Laterality: N/A;  . BACK SURGERY    . CARPAL TUNNEL RELEASE Right 2009  . CARPAL TUNNEL RELEASE Left   . CATARACT EXTRACTION, BILATERAL    . CERVICAL DISCECTOMY  09/07/2016   C7-T1 Anterior Cervical Discectomy and Fusion, Allograft, Plate (N/A)  . EYE SURGERY    . GANGLION CYST EXCISION Left 12/22/2014   Procedure: Excision Left Hand Ganglion cyst;  Surgeon: Marybelle Killings, MD;  Location: [place];  Service: Orthopedics;  Laterality: Left;  . HEMORRHOID SURGERY    . TUBAL LIGATION     Social History   Occupational History  . Not on file.   Social History Main Topics  . Smoking status: Former Smoker    Quit date: 04/09/1992  . Smokeless tobacco: Never Used  . Alcohol use 2.4 oz/week    4 Glasses of wine per week     Comment: 4  . Drug use: No  . Sexual activity: Not on file

## 2016-10-11 ENCOUNTER — Telehealth (INDEPENDENT_AMBULATORY_CARE_PROVIDER_SITE_OTHER): Payer: Self-pay | Admitting: Orthopaedic Surgery

## 2016-10-11 DIAGNOSIS — Z981 Arthrodesis status: Secondary | ICD-10-CM | POA: Insufficient documentation

## 2016-10-11 NOTE — Telephone Encounter (Signed)
Please advise 

## 2016-10-11 NOTE — Telephone Encounter (Signed)
Looks like this pt had surgery on 6/27 and Dr. Lorin Mercy wanted to wait until she was stable for the 2nd surgery. Is she ready to schedule surgery? Will need blue sheet

## 2016-10-11 NOTE — Telephone Encounter (Signed)
PT WOULD LIKE TO [facility] SURGERY PER DR. Lorin Mercy ASAP.  [phone]

## 2016-10-11 NOTE — Telephone Encounter (Signed)
See message.

## 2016-10-11 NOTE — Progress Notes (Signed)
Post-Op Visit Note   Patient: Christina Booker           Date of Birth: March 04, 1941           MRN: [id number] Visit Date: 10/04/2016 PCP: Raina Mina., MD   Assessment & Plan: POST C7-T1 ACDF with allograft and plate. Norco as refill. She's had some problems with posterior occipital headache. She continues to have back pain and leg weakness due to the stenosis at L3-4 above her solid L4-5 fusion. Surgery date for her cervical fusion with 09/07/2016. Once her neck fusion is solid we can proceed with decompression and fusion of the adjacent L3-4 level where she has severe stenosis. Chief Complaint:  Chief Complaint  Patient presents with  . Neck - Pain, Routine Post Op   Visit Diagnoses:  1. Spinal stenosis of lumbar region with neurogenic claudication   2. History of fusion of cervical spine     Plan: Repeat C-spine x-rays on return. Continues collar. Neck incision is well-healed.  Follow-Up Instructions: No Follow-up on file.   Orders:  No orders of the defined types were placed in this encounter.  Meds ordered this encounter  Medications  . HYDROcodone-acetaminophen (NORCO/VICODIN) 5-325 MG tablet    Sig: Take 1-2 tablets by mouth every 4 (four) hours as needed.    Dispense:  20 tablet    Refill:  0    Imaging: No results found.  PMFS History: Patient Active Problem List   Diagnosis Date Noted  . History of fusion of cervical spine 10/11/2016  . Cervical spinal stenosis 09/07/2016  . Spinal stenosis of lumbar region with neurogenic claudication 06/01/2016  . Cervical radiculopathy 05/12/2016  . Spinal stenosis of cervical region 05/12/2016  . Cervicalgia 05/12/2016  . Ganglion of left hand 12/22/2014  . S/P lumbar fusion 04/16/2014   Past Medical History:  Diagnosis Date  . Arthritis    hands, back  . Cervical spinal stenosis   . Complication of anesthesia   . Constipation   . Hot flashes   . Hyperlipidemia   . Hypertension   . Irritable bowel  syndrome   . PONV (postoperative nausea and vomiting)   . Rosacea   . Stroke [facility]) 12/01/2009   tia    Family History  Problem Relation Age of Onset  . Cancer Mother   . Uterine cancer Mother   . Hypertension Mother   . Heart attack Father     Past Surgical History:  Procedure Laterality Date  . ABDOMINAL HYSTERECTOMY    . ANTERIOR CERVICAL DECOMP/DISCECTOMY FUSION N/A 09/07/2016   Procedure: C7-T1 Anterior Cervical Discectomy and Fusion, Allograft, Plate;  Surgeon: Marybelle Killings, MD;  Location: [place];  Service: Orthopedics;  Laterality: N/A;  . BACK SURGERY    . CARPAL TUNNEL RELEASE Right 2009  . CARPAL TUNNEL RELEASE Left   . CATARACT EXTRACTION, BILATERAL    . CERVICAL DISCECTOMY  09/07/2016   C7-T1 Anterior Cervical Discectomy and Fusion, Allograft, Plate (N/A)  . EYE SURGERY    . GANGLION CYST EXCISION Left 12/22/2014   Procedure: Excision Left Hand Ganglion cyst;  Surgeon: Marybelle Killings, MD;  Location: [place];  Service: Orthopedics;  Laterality: Left;  . HEMORRHOID SURGERY    . TUBAL LIGATION     Social History   Occupational History  . Not on file.   Social History Main Topics  . Smoking status: Former Smoker    Quit date: 04/09/1992  . Smokeless tobacco: Never  Used  . Alcohol use 2.4 oz/week    4 Glasses of wine per week     Comment: 4  . Drug use: No  . Sexual activity: Not on file

## 2016-10-12 ENCOUNTER — Telehealth (INDEPENDENT_AMBULATORY_CARE_PROVIDER_SITE_OTHER): Payer: Self-pay | Admitting: Orthopaedic Surgery

## 2016-10-12 NOTE — Telephone Encounter (Signed)
PT WANTS TO [facility] SURGERY ASAP PER YATES   [phone]

## 2016-10-12 NOTE — Telephone Encounter (Signed)
She called again.  Please call her.  Thank you!

## 2016-10-12 NOTE — Telephone Encounter (Signed)
I called and spoke with patient. I advised per Dr. Lorin Mercy, he is going to work on blue sheet and scheduling next week. She is supposed to come in for follow up on Tuesday. I advised that Dr. Lorin Mercy may have her speak with Malachy Mood then. She expressed understanding.

## 2016-10-14 DIAGNOSIS — K6289 Other specified diseases of anus and rectum: Secondary | ICD-10-CM | POA: Diagnosis not present

## 2016-10-14 DIAGNOSIS — I10 Essential (primary) hypertension: Secondary | ICD-10-CM | POA: Diagnosis not present

## 2016-10-14 DIAGNOSIS — K591 Functional diarrhea: Secondary | ICD-10-CM | POA: Diagnosis not present

## 2016-10-14 DIAGNOSIS — M5416 Radiculopathy, lumbar region: Secondary | ICD-10-CM | POA: Diagnosis not present

## 2016-10-18 ENCOUNTER — Ambulatory Visit (INDEPENDENT_AMBULATORY_CARE_PROVIDER_SITE_OTHER): Payer: PPO

## 2016-10-18 ENCOUNTER — Encounter (INDEPENDENT_AMBULATORY_CARE_PROVIDER_SITE_OTHER): Payer: Self-pay | Admitting: Orthopaedic Surgery

## 2016-10-18 ENCOUNTER — Ambulatory Visit (INDEPENDENT_AMBULATORY_CARE_PROVIDER_SITE_OTHER): Payer: PPO | Admitting: Orthopaedic Surgery

## 2016-10-18 VITALS — BP 130/87 | HR 75 | Ht 60.0 in | Wt 165.0 lb

## 2016-10-18 DIAGNOSIS — M4802 Spinal stenosis, cervical region: Secondary | ICD-10-CM

## 2016-10-18 DIAGNOSIS — M48062 Spinal stenosis, lumbar region with neurogenic claudication: Secondary | ICD-10-CM

## 2016-10-18 NOTE — Telephone Encounter (Signed)
Blue sheet done

## 2016-10-18 NOTE — Progress Notes (Signed)
Office Visit Note   Patient: Christina Booker           Date of Birth: August 20, 1940           MRN: [id number] Visit Date: 10/18/2016              Requested by: Raina Mina., MD [address], [place] [address] PCP: Raina Mina., MD   Assessment & Plan: Visit Diagnoses:  1. Spinal stenosis of cervical region     Plan: Surgical plan would be L3-4 decompression removal of intraspinal extradural facet cyst which are causing severe stenosis with decompression of the severe foraminal stenosis  TLIF placed from the right side with pedicle instrumentation and bilateral lateral fusion for instability. Rest of surgery discussed she is RD had the surgery at L4-5 by me in 2015 before she developed the cyst was severe stenosis at the level above. She still has her lumbar brace which is in satisfactory shape. Follow-Up Instructions: No Follow-up on file.   Orders:  Orders Placed This Encounter  Procedures  . XR Cervical Spine 2 or 3 views   No orders of the defined types were placed in this encounter.     Procedures: No procedures performed   Clinical Data: No additional findings.   Subjective: Chief Complaint  Patient presents with  . Neck - Routine Post Op    HPI 76 year old female returns for follow-up of C7-T1 anterior cervical fusion allograft and plate. She states her neck feels good. She can discontinue the collar. She is having ongoing persistent problems with back pain and has an intra-spinal extradural facet cyst which is present on the right side with nerve root compression and persistent radiculopathy and severe spinal stenosis at the L3-4 level which is just above a previous instrumented L4-5 fusion performed in 2015. She said the leg weakness and is been using a walker. She's had epidural injection as well as prednisone pack with persistent symptoms. Patient states she is ready to proceed with the removal of cyst and decompression for spinal stenosis with  instrumented fusion. She reminded me that after the surgery she had minimal pain after the previous L4-5 surgery in 2015 and was off pain medicine after just a few days.  Review of Systems 14 point review of systems positive for previous L4-5 fusion. Spinal stenosis at L3-4. Fusion for anterolisthesis with the spondylosis C7-T1. Good relief of neck pain from cervical fusion. Negative for seizures, MI, brain tumor, stroke, nausea for many TIA in 2011 with no residual. Positive for Wellbutrin use for hot flashes.   Objective: Vital Signs: BP 130/87   Pulse 75   Ht 5' (1.524 m)   Wt 165 lb (74.8 kg)   BMI 32.22 kg/m   Physical Exam  Constitutional: She is oriented to person, place, and time. She appears well-developed.  HENT:  Head: Normocephalic.  Right Ear: External ear normal.  Left Ear: External ear normal.  Eyes: Pupils are equal, round, and reactive to light.  Neck: No tracheal deviation present. No thyromegaly present.  Cardiovascular: Normal rate.   Pulmonary/Chest: Effort normal.  Abdominal: Soft.  Musculoskeletal:  Well-healed anterior incision left side of her neck from the surgery 6 weeks ago. She has good cervical range of motion just coming out of the collar. No well quad EHL weakness. Distal pulses are intact negative Homans sign. No rash or exposed skin she has some sciatic notch tenderness on the right negative on the left.  Neurological: She  is alert and oriented to person, place, and time.  Skin: Skin is warm and dry.  Psychiatric: She has a normal mood and affect. Her behavior is normal.    Ortho Exam  Specialty Comments:  No specialty comments available.  Imaging: Xr Cervical Spine 2 Or 3 Views  Result Date: 10/18/2016 AP, swimmer's view lateral and standard lateral C-spine x-ray obtained. Poor visualization of C7-T1 graft there is no evidence of loosening of the screws. An AP x-ray appears to be the uncovertebral joints are progressing with fusion. Impression  status post C7-T1 anterior fusion satisfactory postop x-rays poor visualization due to overlying ribs and clavicle  Lumbar MRI March 2018 showed severe spinal stenosis at L3-4 above solid well decompressed instrumented fusion L4-5. Multiple degenerative cysts largest being 8 mm with some other smaller components of the cyst causing severe lateral recess stenosis worse on the right than left. No significant narrowing at other levels.  PMFS History: Patient Active Problem List   Diagnosis Date Noted  . History of fusion of cervical spine 10/11/2016  . Cervical spinal stenosis 09/07/2016  . Spinal stenosis of lumbar region with neurogenic claudication 06/01/2016  . Cervical radiculopathy 05/12/2016  . Spinal stenosis of cervical region 05/12/2016  . Cervicalgia 05/12/2016  . Ganglion of left hand 12/22/2014  . S/P lumbar fusion 04/16/2014   Past Medical History:  Diagnosis Date  . Arthritis    hands, back  . Cervical spinal stenosis   . Complication of anesthesia   . Constipation   . Hot flashes   . Hyperlipidemia   . Hypertension   . Irritable bowel syndrome   . PONV (postoperative nausea and vomiting)   . Rosacea   . Stroke [facility]) 12/01/2009   tia    Family History  Problem Relation Age of Onset  . Cancer Mother   . Uterine cancer Mother   . Hypertension Mother   . Heart attack Father     Past Surgical History:  Procedure Laterality Date  . ABDOMINAL HYSTERECTOMY    . ANTERIOR CERVICAL DECOMP/DISCECTOMY FUSION N/A 09/07/2016   Procedure: C7-T1 Anterior Cervical Discectomy and Fusion, Allograft, Plate;  Surgeon: Marybelle Killings, MD;  Location: [place];  Service: Orthopedics;  Laterality: N/A;  . BACK SURGERY    . CARPAL TUNNEL RELEASE Right 2009  . CARPAL TUNNEL RELEASE Left   . CATARACT EXTRACTION, BILATERAL    . CERVICAL DISCECTOMY  09/07/2016   C7-T1 Anterior Cervical Discectomy and Fusion, Allograft, Plate (N/A)  . EYE SURGERY    . GANGLION CYST EXCISION Left 12/22/2014     Procedure: Excision Left Hand Ganglion cyst;  Surgeon: Marybelle Killings, MD;  Location: [place];  Service: Orthopedics;  Laterality: Left;  . HEMORRHOID SURGERY    . TUBAL LIGATION     Social History   Occupational History  . Not on file.   Social History Main Topics  . Smoking status: Former Smoker    Quit date: 04/09/1992  . Smokeless tobacco: Never Used  . Alcohol use 2.4 oz/week    4 Glasses of wine per week     Comment: 4  . Drug use: No  . Sexual activity: Not on file

## 2016-10-20 DIAGNOSIS — K2960 Other gastritis without bleeding: Secondary | ICD-10-CM | POA: Diagnosis not present

## 2016-10-20 DIAGNOSIS — R194 Change in bowel habit: Secondary | ICD-10-CM | POA: Diagnosis not present

## 2016-10-24 ENCOUNTER — Telehealth (INDEPENDENT_AMBULATORY_CARE_PROVIDER_SITE_OTHER): Payer: Self-pay | Admitting: Orthopaedic Surgery

## 2016-10-24 NOTE — Telephone Encounter (Signed)
Patient called saying that she is experiencing bad headaches every day for over a week, and was wondering what she should do. Wondering if this was normal? CB # [phone]

## 2016-10-24 NOTE — Telephone Encounter (Signed)
I called discussed. She tried putting a collar on and did not make any difference the headache usually 4 PM. She took 1 Vicodin and made the headache go away. She still has the back and leg symptoms as before. She is waiting on surgical scheduling for lumbar fusion.

## 2016-10-24 NOTE — Telephone Encounter (Signed)
Please advise 

## 2016-10-28 DIAGNOSIS — Z1231 Encounter for screening mammogram for malignant neoplasm of breast: Secondary | ICD-10-CM | POA: Diagnosis not present

## 2016-10-28 DIAGNOSIS — G4489 Other headache syndrome: Secondary | ICD-10-CM | POA: Diagnosis not present

## 2016-11-03 DIAGNOSIS — G4489 Other headache syndrome: Secondary | ICD-10-CM | POA: Diagnosis not present

## 2016-11-03 DIAGNOSIS — Z981 Arthrodesis status: Secondary | ICD-10-CM | POA: Diagnosis not present

## 2016-11-03 DIAGNOSIS — R51 Headache: Secondary | ICD-10-CM | POA: Diagnosis not present

## 2016-11-07 ENCOUNTER — Telehealth (INDEPENDENT_AMBULATORY_CARE_PROVIDER_SITE_OTHER): Payer: Self-pay

## 2016-11-07 NOTE — Telephone Encounter (Signed)
Butch Penny from Dr Willette Pa office called, left an urgent message on triage. IC and she was unavailable, so I advised their receptionist to let Butch Penny know I would call patient to schedule an appt. When I called patient, she had no idea of the need to see Lorin Mercy, and they had not called her about the CT scan.  I briefly went over what it showed, and tried to explain the best I could.  I was unaware of this and apologized to her. I have made her an appt at 3pm tomorrow with Lorin Mercy, and the CT images/report are in [place].

## 2016-11-07 NOTE — Telephone Encounter (Signed)
Pt for work in  appt

## 2016-11-07 NOTE — Telephone Encounter (Signed)
Butch Penny with Dr. Willette Pa office calling to see if patient can be scheduled this week to see Dr. Lorin Mercy to discuss CT scan.  Cb# is [phone].  Please advise.  Thank you.

## 2016-11-08 ENCOUNTER — Ambulatory Visit (INDEPENDENT_AMBULATORY_CARE_PROVIDER_SITE_OTHER): Payer: PPO | Admitting: Orthopaedic Surgery

## 2016-11-08 ENCOUNTER — Encounter (INDEPENDENT_AMBULATORY_CARE_PROVIDER_SITE_OTHER): Payer: Self-pay | Admitting: Orthopaedic Surgery

## 2016-11-08 DIAGNOSIS — Z981 Arthrodesis status: Secondary | ICD-10-CM

## 2016-11-08 NOTE — Progress Notes (Addendum)
Office Visit Note   Patient: Christina Booker           Date of Birth: [birth year]           MRN: [id number] Visit Date: 11/08/2016              Requested by: Raina Mina., MD [address], [place] [address] PCP: Raina Mina., MD   Assessment & Plan: Visit Diagnoses:  1. History of fusion of cervical spine     Plan: CT scan was reviewed. I discussed with her that I think the CT scan shows satisfactory positioning. Screws are close to the endplate but her in the vertebral body and not near the adjacent levels. We'll proceed with that the Lumbar fusion is scheduled next month for severe stenosis and neurogenic claudication symptoms.  Follow-Up Instructions: No Follow-up on file.   Orders:  No orders of the defined types were placed in this encounter.  No orders of the defined types were placed in this encounter.     Procedures: No procedures performed   Clinical Data: No additional findings.   Subjective: Chief Complaint  Patient presents with  . Neck - Pain, Follow-up    HPI patient turned she's had the CT scan ordered by her primary caregiver head for purposes of headache. CT scan was read as possibly some graft subsidence and I reviewed the scans. We discussed the entry to the C7-T1 being barely above the clavicle and demonstrated this on the scans. She does not have any narrowing of spinous process gap posteriorly and it appears that the graft is sitting where it was previously. Poor visualization on Intra-Op and postop x-rays due to overlying ribs and clavicle. She's not having any arm pain just has headaches late in the afternoon. She has significant spinal stenosis with neurogenic claudication and is scheduled for decompression surgery for a significant synovial cyst and severe spinal stenosis at L3-4 above her Solid fusion at L4-5  from 2015.  Review of Systems unchanged from last office visit   Objective: Vital Signs: There were no vitals taken  for this visit.  Physical Exam  Constitutional: She is oriented to person, place, and time. She appears well-developed.  HENT:  Head: Normocephalic.  Right Ear: External ear normal.  Left Ear: External ear normal.  Eyes: Pupils are equal, round, and reactive to light.  Neck: No tracheal deviation present. No thyromegaly present.  Cardiovascular: Normal rate.   Pulmonary/Chest: Effort normal.  Abdominal: Soft.  Neurological: She is alert and oriented to person, place, and time.  Skin: Skin is warm and dry.  Psychiatric: She has a normal mood and affect. Her behavior is normal.   upper extremity reflexes are intact states strands are intact anterior cervical incisions well-healed. No pedal flexion-extension.  Ortho Exam  Specialty Comments:  No specialty comments available.  Imaging: No results found.   PMFS History: Patient Active Problem List   Diagnosis Date Noted  . History of fusion of cervical spine 10/11/2016  . Cervical spinal stenosis 09/07/2016  . Spinal stenosis of lumbar region with neurogenic claudication 06/01/2016  . Cervical radiculopathy 05/12/2016  . Spinal stenosis of cervical region 05/12/2016  . Cervicalgia 05/12/2016  . Ganglion of left hand 12/22/2014  . S/P lumbar fusion 04/16/2014   Past Medical History:  Diagnosis Date  . Arthritis    hands, back  . Cervical spinal stenosis   . Complication of anesthesia   . Constipation   . Hot  flashes   . Hyperlipidemia   . Hypertension   . Irritable bowel syndrome   . PONV (postoperative nausea and vomiting)   . Rosacea   . Stroke [facility]) 12/01/2009   tia    Family History  Problem Relation Age of Onset  . Cancer Mother   . Uterine cancer Mother   . Hypertension Mother   . Heart attack Father     Past Surgical History:  Procedure Laterality Date  . ABDOMINAL HYSTERECTOMY    . ANTERIOR CERVICAL DECOMP/DISCECTOMY FUSION N/A 09/07/2016   Procedure: C7-T1 Anterior Cervical Discectomy and Fusion,  Allograft, Plate;  Surgeon: Marybelle Killings, MD;  Location: [place];  Service: Orthopedics;  Laterality: N/A;  . BACK SURGERY    . CARPAL TUNNEL RELEASE Right 2009  . CARPAL TUNNEL RELEASE Left   . CATARACT EXTRACTION, BILATERAL    . CERVICAL DISCECTOMY  09/07/2016   C7-T1 Anterior Cervical Discectomy and Fusion, Allograft, Plate (N/A)  . EYE SURGERY    . GANGLION CYST EXCISION Left 12/22/2014   Procedure: Excision Left Hand Ganglion cyst;  Surgeon: Marybelle Killings, MD;  Location: [place];  Service: Orthopedics;  Laterality: Left;  . HEMORRHOID SURGERY    . TUBAL LIGATION     Social History   Occupational History  . Not on file.   Social History Main Topics  . Smoking status: Former Smoker    Quit date: 04/09/1992  . Smokeless tobacco: Never Used  . Alcohol use 2.4 oz/week    4 Glasses of wine per week     Comment: 4  . Drug use: No  . Sexual activity: Not on file

## 2016-11-10 DIAGNOSIS — Z1231 Encounter for screening mammogram for malignant neoplasm of breast: Secondary | ICD-10-CM | POA: Diagnosis not present

## 2016-11-16 NOTE — Pre-Procedure Instructions (Signed)
DEASHIA SOULE  11/16/2016      [facility] [address], [place] [address] Phone: [phone] Fax: [phone]    Your procedure is scheduled on September 10  Report to [facility] at 1030 A.M.  Call this number if you have problems the morning of surgery:  [phone]   Remember:  Do not eat food or drink liquids after midnight.  Continue all other medications as directed by your physician except follow these medication instructions before surgery   Take these medicines the morning of surgery with A SIP OF WATER  acetaminophen (TYLENOL)  buPROPion (WELLBUTRIN XL) dicyclomine (BENTYL) HYDROcodone-acetaminophen (NORCO/VICODIN)  pantoprazole (PROTONIX) Eye drops  7 days prior to surgery STOP taking any Aspirin, Aleve, Naproxen, Ibuprofen, Motrin, Advil, Goody's, BC's, all herbal medications, fish oil, and all vitamins    Do not wear jewelry, make-up or nail polish.  Do not wear lotions, powders, or perfumes, or deoderant.  Do not shave 48 hours prior to surgery.  Men may shave face and neck.  Do not bring valuables to the hospital.  [facility] is not responsible for any belongings or valuables.  Contacts, dentures or bridgework may not be worn into surgery.  Leave your suitcase in the car.  After surgery it may be brought to your room.  For patients admitted to the hospital, discharge time will be determined by your treatment team.  Patients discharged the day of surgery will not be allowed to drive home.    Special instructions:   [facility]- Preparing For Surgery  Before surgery, you can play an important role. Because skin is not sterile, your skin needs to be as free of germs as possible. You can reduce the number of germs on your skin by washing with CHG (chlorahexidine gluconate) Soap before surgery.  CHG is an antiseptic cleaner which kills germs and bonds with the skin to  continue killing germs even after washing.  Please do not use if you have an allergy to CHG or antibacterial soaps. If your skin becomes reddened/irritated stop using the CHG.  Do not shave (including legs and underarms) for at least 48 hours prior to first CHG shower. It is OK to shave your face.  Please follow these instructions carefully.   1. Shower the NIGHT BEFORE SURGERY and the MORNING OF SURGERY with CHG.   2. If you chose to wash your hair, wash your hair first as usual with your normal shampoo.  3. After you shampoo, rinse your hair and body thoroughly to remove the shampoo.  4. Use CHG as you would any other liquid soap. You can apply CHG directly to the skin and wash gently with a scrungie or a clean washcloth.   5. Apply the CHG Soap to your body ONLY FROM THE NECK DOWN.  Do not use on open wounds or open sores. Avoid contact with your eyes, ears, mouth and genitals (private parts). Wash genitals (private parts) with your normal soap.  6. Wash thoroughly, paying special attention to the area where your surgery will be performed.  7. Thoroughly rinse your body with warm water from the neck down.  8. DO NOT shower/wash with your normal soap after using and rinsing off the CHG Soap.  9. Pat yourself dry with a CLEAN TOWEL.   10. Wear CLEAN PAJAMAS   11. Place CLEAN SHEETS on your bed the night of your first shower  and DO NOT SLEEP WITH PETS.    Day of Surgery: Do not apply any deodorants/lotions. Please wear clean clothes to the hospital/surgery center.      Please read over the following fact sheets that you were given.

## 2016-11-17 ENCOUNTER — Encounter (HOSPITAL_COMMUNITY): Payer: Self-pay | Admitting: *Deleted

## 2016-11-17 ENCOUNTER — Encounter (HOSPITAL_COMMUNITY)
Admission: RE | Admit: 2016-11-17 | Discharge: 2016-11-17 | Disposition: A | Discharge status: Home / Self Care | Payer: PPO | Source: Ambulatory Visit | Attending: Orthopaedic Surgery | Admitting: Orthopaedic Surgery

## 2016-11-17 DIAGNOSIS — M4802 Spinal stenosis, cervical region: Secondary | ICD-10-CM | POA: Diagnosis not present

## 2016-11-17 DIAGNOSIS — M48062 Spinal stenosis, lumbar region with neurogenic claudication: Secondary | ICD-10-CM | POA: Insufficient documentation

## 2016-11-17 DIAGNOSIS — Z885 Allergy status to narcotic agent status: Secondary | ICD-10-CM | POA: Diagnosis not present

## 2016-11-17 DIAGNOSIS — Z8673 Personal history of transient ischemic attack (TIA), and cerebral infarction without residual deficits: Secondary | ICD-10-CM | POA: Diagnosis not present

## 2016-11-17 DIAGNOSIS — Z79899 Other long term (current) drug therapy: Secondary | ICD-10-CM | POA: Diagnosis not present

## 2016-11-17 DIAGNOSIS — E785 Hyperlipidemia, unspecified: Secondary | ICD-10-CM | POA: Diagnosis present

## 2016-11-17 DIAGNOSIS — Z880 Allergy status to penicillin: Secondary | ICD-10-CM | POA: Diagnosis not present

## 2016-11-17 DIAGNOSIS — Z87891 Personal history of nicotine dependence: Secondary | ICD-10-CM | POA: Diagnosis not present

## 2016-11-17 DIAGNOSIS — K219 Gastro-esophageal reflux disease without esophagitis: Secondary | ICD-10-CM | POA: Diagnosis present

## 2016-11-17 DIAGNOSIS — E669 Obesity, unspecified: Secondary | ICD-10-CM | POA: Diagnosis present

## 2016-11-17 DIAGNOSIS — Z888 Allergy status to other drugs, medicaments and biological substances status: Secondary | ICD-10-CM | POA: Diagnosis not present

## 2016-11-17 DIAGNOSIS — Z8249 Family history of ischemic heart disease and other diseases of the circulatory system: Secondary | ICD-10-CM | POA: Diagnosis not present

## 2016-11-17 DIAGNOSIS — M5412 Radiculopathy, cervical region: Secondary | ICD-10-CM | POA: Insufficient documentation

## 2016-11-17 DIAGNOSIS — M48061 Spinal stenosis, lumbar region without neurogenic claudication: Secondary | ICD-10-CM | POA: Diagnosis not present

## 2016-11-17 DIAGNOSIS — Z01812 Encounter for preprocedural laboratory examination: Secondary | ICD-10-CM | POA: Insufficient documentation

## 2016-11-17 DIAGNOSIS — I739 Peripheral vascular disease, unspecified: Secondary | ICD-10-CM | POA: Diagnosis present

## 2016-11-17 DIAGNOSIS — Z6832 Body mass index (BMI) 32.0-32.9, adult: Secondary | ICD-10-CM | POA: Diagnosis not present

## 2016-11-17 DIAGNOSIS — K589 Irritable bowel syndrome without diarrhea: Secondary | ICD-10-CM | POA: Diagnosis present

## 2016-11-17 DIAGNOSIS — Z7982 Long term (current) use of aspirin: Secondary | ICD-10-CM | POA: Diagnosis not present

## 2016-11-17 DIAGNOSIS — Z981 Arthrodesis status: Secondary | ICD-10-CM | POA: Diagnosis present

## 2016-11-17 DIAGNOSIS — I10 Essential (primary) hypertension: Secondary | ICD-10-CM | POA: Diagnosis present

## 2016-11-17 DIAGNOSIS — M5416 Radiculopathy, lumbar region: Secondary | ICD-10-CM | POA: Diagnosis present

## 2016-11-17 DIAGNOSIS — Z79891 Long term (current) use of opiate analgesic: Secondary | ICD-10-CM | POA: Diagnosis not present

## 2016-11-17 DIAGNOSIS — M67442 Ganglion, left hand: Secondary | ICD-10-CM | POA: Diagnosis not present

## 2016-11-17 DIAGNOSIS — M4326 Fusion of spine, lumbar region: Secondary | ICD-10-CM | POA: Diagnosis not present

## 2016-11-17 DIAGNOSIS — G9619 Other disorders of meninges, not elsewhere classified: Secondary | ICD-10-CM | POA: Diagnosis present

## 2016-11-17 DIAGNOSIS — Z90710 Acquired absence of both cervix and uterus: Secondary | ICD-10-CM | POA: Diagnosis not present

## 2016-11-17 LAB — SURGICAL PCR SCREEN
MRSA, PCR: NEGATIVE
STAPHYLOCOCCUS AUREUS: NEGATIVE

## 2016-11-17 LAB — ABO/RH: ABO/RH(D): A POS

## 2016-11-17 LAB — CBC
HEMATOCRIT: 44.8 % (ref 36.0–46.0)
Hemoglobin: 14.8 g/dL (ref 12.0–15.0)
MCH: 29.2 pg (ref 26.0–34.0)
MCHC: 33 g/dL (ref 30.0–36.0)
MCV: 88.5 fL (ref 78.0–100.0)
PLATELETS: 221 10*3/uL (ref 150–400)
RBC: 5.06 MIL/uL (ref 3.87–5.11)
RDW: 14.3 % (ref 11.5–15.5)
WBC: 8.8 10*3/uL (ref 4.0–10.5)

## 2016-11-17 LAB — BASIC METABOLIC PANEL
Anion gap: 8 (ref 5–15)
BUN: 10 mg/dL (ref 6–20)
CALCIUM: 9.4 mg/dL (ref 8.9–10.3)
CO2: 25 mmol/L (ref 22–32)
CREATININE: 0.8 mg/dL (ref 0.44–1.00)
Chloride: 108 mmol/L (ref 101–111)
GFR calc Af Amer: >60 mL/min (ref >60)
GLUCOSE: 95 mg/dL (ref 65–99)
POTASSIUM: 4.2 mmol/L (ref 3.5–5.1)
SODIUM: 141 mmol/L (ref 135–145)

## 2016-11-17 LAB — TYPE AND SCREEN
ABO/RH(D): A POS
ANTIBODY SCREEN: NEGATIVE

## 2016-11-20 MED ORDER — VANCOMYCIN HCL IN DEXTROSE 1-5 GM/200ML-% IV SOLN
1000.0000 mg | INTRAVENOUS | Status: AC
  Administered 2016-11-21: 1000 mg via INTRAVENOUS
  Filled 2016-11-20: qty 200

## 2016-11-21 ENCOUNTER — Inpatient Hospital Stay (HOSPITAL_COMMUNITY)
Admission: RE | Disposition: A | Discharge status: Home / Self Care | Payer: Self-pay | Source: Ambulatory Visit | Attending: Orthopaedic Surgery

## 2016-11-21 ENCOUNTER — Inpatient Hospital Stay (HOSPITAL_COMMUNITY)
Admission: RE | Admit: 2016-11-21 | Discharge: 2016-11-23 | DRG: 460 | Disposition: A | Discharge status: Home / Self Care | Payer: PPO | Source: Ambulatory Visit | Attending: Orthopaedic Surgery | Admitting: Orthopaedic Surgery

## 2016-11-21 ENCOUNTER — Inpatient Hospital Stay (HOSPITAL_COMMUNITY): Payer: PPO | Admitting: Certified Registered Nurse Anesthetist

## 2016-11-21 ENCOUNTER — Encounter (HOSPITAL_COMMUNITY): Payer: Self-pay

## 2016-11-21 ENCOUNTER — Inpatient Hospital Stay (HOSPITAL_COMMUNITY): Payer: PPO

## 2016-11-21 DIAGNOSIS — E785 Hyperlipidemia, unspecified: Secondary | ICD-10-CM | POA: Diagnosis present

## 2016-11-21 DIAGNOSIS — Z90710 Acquired absence of both cervix and uterus: Secondary | ICD-10-CM | POA: Diagnosis not present

## 2016-11-21 DIAGNOSIS — K589 Irritable bowel syndrome without diarrhea: Secondary | ICD-10-CM | POA: Diagnosis present

## 2016-11-21 DIAGNOSIS — Z8249 Family history of ischemic heart disease and other diseases of the circulatory system: Secondary | ICD-10-CM

## 2016-11-21 DIAGNOSIS — Z79899 Other long term (current) drug therapy: Secondary | ICD-10-CM

## 2016-11-21 DIAGNOSIS — I10 Essential (primary) hypertension: Secondary | ICD-10-CM | POA: Diagnosis present

## 2016-11-21 DIAGNOSIS — Z981 Arthrodesis status: Secondary | ICD-10-CM

## 2016-11-21 DIAGNOSIS — Z885 Allergy status to narcotic agent status: Secondary | ICD-10-CM | POA: Diagnosis not present

## 2016-11-21 DIAGNOSIS — E669 Obesity, unspecified: Secondary | ICD-10-CM | POA: Diagnosis present

## 2016-11-21 DIAGNOSIS — Z888 Allergy status to other drugs, medicaments and biological substances status: Secondary | ICD-10-CM | POA: Diagnosis not present

## 2016-11-21 DIAGNOSIS — Z79891 Long term (current) use of opiate analgesic: Secondary | ICD-10-CM | POA: Diagnosis not present

## 2016-11-21 DIAGNOSIS — Z8673 Personal history of transient ischemic attack (TIA), and cerebral infarction without residual deficits: Secondary | ICD-10-CM

## 2016-11-21 DIAGNOSIS — M48061 Spinal stenosis, lumbar region without neurogenic claudication: Secondary | ICD-10-CM | POA: Diagnosis present

## 2016-11-21 DIAGNOSIS — Z6832 Body mass index (BMI) 32.0-32.9, adult: Secondary | ICD-10-CM

## 2016-11-21 DIAGNOSIS — Z880 Allergy status to penicillin: Secondary | ICD-10-CM | POA: Diagnosis not present

## 2016-11-21 DIAGNOSIS — Z87891 Personal history of nicotine dependence: Secondary | ICD-10-CM

## 2016-11-21 DIAGNOSIS — M48062 Spinal stenosis, lumbar region with neurogenic claudication: Principal | ICD-10-CM | POA: Diagnosis present

## 2016-11-21 DIAGNOSIS — G9619 Other disorders of meninges, not elsewhere classified: Secondary | ICD-10-CM | POA: Diagnosis present

## 2016-11-21 DIAGNOSIS — I739 Peripheral vascular disease, unspecified: Secondary | ICD-10-CM | POA: Diagnosis present

## 2016-11-21 DIAGNOSIS — K219 Gastro-esophageal reflux disease without esophagitis: Secondary | ICD-10-CM | POA: Diagnosis present

## 2016-11-21 DIAGNOSIS — M5416 Radiculopathy, lumbar region: Secondary | ICD-10-CM | POA: Diagnosis present

## 2016-11-21 DIAGNOSIS — M4326 Fusion of spine, lumbar region: Secondary | ICD-10-CM | POA: Diagnosis not present

## 2016-11-21 DIAGNOSIS — Z7982 Long term (current) use of aspirin: Secondary | ICD-10-CM

## 2016-11-21 DIAGNOSIS — Z9889 Other specified postprocedural states: Secondary | ICD-10-CM

## 2016-11-21 DIAGNOSIS — Z419 Encounter for procedure for purposes other than remedying health state, unspecified: Secondary | ICD-10-CM

## 2016-11-21 DIAGNOSIS — R112 Nausea with vomiting, unspecified: Secondary | ICD-10-CM | POA: Insufficient documentation

## 2016-11-21 HISTORY — DX: Headache: R51

## 2016-11-21 HISTORY — DX: Headache, unspecified: R51.9

## 2016-11-21 SURGERY — POSTERIOR LUMBAR FUSION 1 LEVEL
Anesthesia: General | Site: Back

## 2016-11-21 MED ORDER — ONDANSETRON HCL 4 MG/2ML IJ SOLN
4.0000 mg | Freq: Four times a day (QID) | INTRAMUSCULAR | Status: DC | PRN
  Administered 2016-11-21: 4 mg via INTRAVENOUS

## 2016-11-21 MED ORDER — ACETAMINOPHEN 650 MG RE SUPP: 650.0000 mg | RECTAL | Status: DC | PRN

## 2016-11-21 MED ORDER — THROMBIN 20000 UNITS EX SOLR
CUTANEOUS | Status: AC
  Filled 2016-11-21: qty 20000

## 2016-11-21 MED ORDER — LIDOCAINE 2% (20 MG/ML) 5 ML SYRINGE
INTRAMUSCULAR | Status: DC | PRN
  Administered 2016-11-21: 60 mg via INTRAVENOUS

## 2016-11-21 MED ORDER — HYDROMORPHONE HCL 1 MG/ML IJ SOLN: 0.2500 mg | INTRAMUSCULAR | Status: DC | PRN

## 2016-11-21 MED ORDER — POLYETHYLENE GLYCOL 3350 17 G PO PACK
17.0000 g | PACK | Freq: Every day | ORAL | Status: DC | PRN
Start: 2016-11-21 — End: 2016-11-23

## 2016-11-21 MED ORDER — MIDAZOLAM HCL 2 MG/2ML IJ SOLN
INTRAMUSCULAR | Status: DC | PRN
  Administered 2016-11-21 (×2): 1 mg via INTRAVENOUS

## 2016-11-21 MED ORDER — PROMETHAZINE HCL 25 MG/ML IJ SOLN: 6.2500 mg | INTRAMUSCULAR | Status: DC | PRN

## 2016-11-21 MED ORDER — SUGAMMADEX SODIUM 200 MG/2ML IV SOLN
INTRAVENOUS | Status: AC
  Filled 2016-11-21: qty 4

## 2016-11-21 MED ORDER — CHLORHEXIDINE GLUCONATE 4 % EX LIQD: 60.0000 mL | Freq: Once | CUTANEOUS | Status: DC

## 2016-11-21 MED ORDER — METHOCARBAMOL 500 MG PO TABS
500.0000 mg | ORAL_TABLET | Freq: Four times a day (QID) | ORAL | Status: DC | PRN
  Administered 2016-11-22 – 2016-11-23 (×4): 500 mg via ORAL
  Filled 2016-11-21 (×4): qty 1

## 2016-11-21 MED ORDER — LIDOCAINE 2% (20 MG/ML) 5 ML SYRINGE
INTRAMUSCULAR | Status: AC
  Filled 2016-11-21: qty 5

## 2016-11-21 MED ORDER — DEXAMETHASONE SODIUM PHOSPHATE 10 MG/ML IJ SOLN
INTRAMUSCULAR | Status: AC
  Filled 2016-11-21: qty 1

## 2016-11-21 MED ORDER — VITAMIN D 1000 UNITS PO TABS
1000.0000 [IU] | ORAL_TABLET | Freq: Two times a day (BID) | ORAL | Status: DC
  Administered 2016-11-21 – 2016-11-23 (×4): 1000 [IU] via ORAL
  Filled 2016-11-21 (×6): qty 1

## 2016-11-21 MED ORDER — ONDANSETRON HCL 4 MG/2ML IJ SOLN
INTRAMUSCULAR | Status: DC | PRN
  Administered 2016-11-21 (×2): 4 mg via INTRAVENOUS

## 2016-11-21 MED ORDER — PROPOFOL 10 MG/ML IV BOLUS
INTRAVENOUS | Status: AC
  Filled 2016-11-21: qty 20

## 2016-11-21 MED ORDER — ROCURONIUM BROMIDE 10 MG/ML (PF) SYRINGE
PREFILLED_SYRINGE | INTRAVENOUS | Status: AC
  Filled 2016-11-21: qty 10

## 2016-11-21 MED ORDER — LISINOPRIL 5 MG PO TABS
5.0000 mg | ORAL_TABLET | Freq: Every day | ORAL | Status: DC
  Administered 2016-11-21: 5 mg via ORAL
  Filled 2016-11-21 (×3): qty 1

## 2016-11-21 MED ORDER — SODIUM CHLORIDE 0.9% FLUSH
3.0000 mL | Freq: Two times a day (BID) | INTRAVENOUS | Status: DC
Start: 2016-11-21 — End: 2016-11-23
  Administered 2016-11-22 (×2): 3 mL via INTRAVENOUS

## 2016-11-21 MED ORDER — ONDANSETRON HCL 4 MG/2ML IJ SOLN
INTRAMUSCULAR | Status: AC
  Filled 2016-11-21: qty 2

## 2016-11-21 MED ORDER — SODIUM CHLORIDE 0.9 % IV SOLN
INTRAVENOUS | Status: DC
  Administered 2016-11-21: 19:00:00 via INTRAVENOUS

## 2016-11-21 MED ORDER — BUPROPION HCL ER (XL) 150 MG PO TB24
150.0000 mg | ORAL_TABLET | Freq: Every morning | ORAL | Status: DC
  Administered 2016-11-22 – 2016-11-23 (×2): 150 mg via ORAL
  Filled 2016-11-21 (×2): qty 1

## 2016-11-21 MED ORDER — LACTATED RINGERS IV SOLN
INTRAVENOUS | Status: DC
  Administered 2016-11-21 (×3): via INTRAVENOUS

## 2016-11-21 MED ORDER — PHENOL 1.4 % MT LIQD: 1.0000 | OROMUCOSAL | Status: DC | PRN

## 2016-11-21 MED ORDER — ONDANSETRON HCL 4 MG PO TABS
4.0000 mg | ORAL_TABLET | Freq: Four times a day (QID) | ORAL | Status: DC | PRN
  Administered 2016-11-22: 4 mg via ORAL
  Filled 2016-11-21: qty 1

## 2016-11-21 MED ORDER — PROPOFOL 500 MG/50ML IV EMUL
INTRAVENOUS | Status: DC | PRN
  Administered 2016-11-21: 25 ug/kg/min via INTRAVENOUS

## 2016-11-21 MED ORDER — THROMBIN 20000 UNITS EX KIT
PACK | CUTANEOUS | Status: DC | PRN
  Administered 2016-11-21: 20000 [IU] via TOPICAL

## 2016-11-21 MED ORDER — VANCOMYCIN HCL IN DEXTROSE 1-5 GM/200ML-% IV SOLN
1000.0000 mg | Freq: Once | INTRAVENOUS | Status: AC
  Administered 2016-11-21: 1000 mg via INTRAVENOUS
  Filled 2016-11-21: qty 200

## 2016-11-21 MED ORDER — PROPOFOL 10 MG/ML IV BOLUS
INTRAVENOUS | Status: DC | PRN
  Administered 2016-11-21: 130 mg via INTRAVENOUS

## 2016-11-21 MED ORDER — BUPIVACAINE HCL 0.5 % IJ SOLN
INTRAMUSCULAR | Status: DC | PRN
  Administered 2016-11-21: 30 mL

## 2016-11-21 MED ORDER — MIDAZOLAM HCL 2 MG/2ML IJ SOLN
INTRAMUSCULAR | Status: AC
  Filled 2016-11-21: qty 2

## 2016-11-21 MED ORDER — DOCUSATE SODIUM 100 MG PO CAPS
100.0000 mg | ORAL_CAPSULE | Freq: Two times a day (BID) | ORAL | Status: DC
  Administered 2016-11-21 – 2016-11-23 (×4): 100 mg via ORAL
  Filled 2016-11-21 (×4): qty 1

## 2016-11-21 MED ORDER — HEMOSTATIC AGENTS (NO CHARGE) OPTIME
TOPICAL | Status: DC | PRN
  Administered 2016-11-21: 1 via TOPICAL

## 2016-11-21 MED ORDER — SCOPOLAMINE 1 MG/3DAYS TD PT72
1.0000 | MEDICATED_PATCH | Freq: Once | TRANSDERMAL | Status: AC
  Administered 2016-11-21: 1 via TRANSDERMAL
  Filled 2016-11-21: qty 1

## 2016-11-21 MED ORDER — HYDROMORPHONE HCL 1 MG/ML IJ SOLN: 0.5000 mg | INTRAMUSCULAR | Status: DC | PRN

## 2016-11-21 MED ORDER — FENTANYL CITRATE (PF) 250 MCG/5ML IJ SOLN
INTRAMUSCULAR | Status: AC
  Filled 2016-11-21: qty 5

## 2016-11-21 MED ORDER — OXYCODONE HCL 5 MG PO TABS
5.0000 mg | ORAL_TABLET | ORAL | Status: DC | PRN
  Administered 2016-11-21 – 2016-11-22 (×3): 5 mg via ORAL
  Administered 2016-11-22 (×2): 10 mg via ORAL
  Administered 2016-11-22: 5 mg via ORAL
  Filled 2016-11-21: qty 1
  Filled 2016-11-21 (×2): qty 2
  Filled 2016-11-21 (×3): qty 1

## 2016-11-21 MED ORDER — PROMETHAZINE HCL 25 MG/ML IJ SOLN
12.5000 mg | Freq: Four times a day (QID) | INTRAMUSCULAR | Status: DC | PRN
  Administered 2016-11-21 – 2016-11-22 (×2): 12.5 mg via INTRAVENOUS
  Filled 2016-11-21 (×2): qty 1

## 2016-11-21 MED ORDER — 0.9 % SODIUM CHLORIDE (POUR BTL) OPTIME
TOPICAL | Status: DC | PRN
  Administered 2016-11-21: 1000 mL

## 2016-11-21 MED ORDER — PHENYLEPHRINE HCL 10 MG/ML IJ SOLN
INTRAVENOUS | Status: DC | PRN
  Administered 2016-11-21: 30 ug/min via INTRAVENOUS

## 2016-11-21 MED ORDER — DEXAMETHASONE SODIUM PHOSPHATE 10 MG/ML IJ SOLN
INTRAMUSCULAR | Status: DC | PRN
  Administered 2016-11-21: 10 mg via INTRAVENOUS

## 2016-11-21 MED ORDER — ACETAMINOPHEN 325 MG PO TABS
650.0000 mg | ORAL_TABLET | ORAL | Status: DC | PRN
  Administered 2016-11-22: 650 mg via ORAL
  Filled 2016-11-21: qty 2

## 2016-11-21 MED ORDER — MENTHOL 3 MG MT LOZG: 1.0000 | LOZENGE | OROMUCOSAL | Status: DC | PRN

## 2016-11-21 MED ORDER — MEPERIDINE HCL 25 MG/ML IJ SOLN: 6.2500 mg | INTRAMUSCULAR | Status: DC | PRN

## 2016-11-21 MED ORDER — DICYCLOMINE HCL 10 MG PO CAPS
10.0000 mg | ORAL_CAPSULE | Freq: Two times a day (BID) | ORAL | Status: DC | PRN
  Filled 2016-11-21: qty 1

## 2016-11-21 MED ORDER — SUGAMMADEX SODIUM 200 MG/2ML IV SOLN
INTRAVENOUS | Status: AC
  Filled 2016-11-21: qty 2

## 2016-11-21 MED ORDER — ARTIFICIAL TEARS OPHTHALMIC OINT
TOPICAL_OINTMENT | OPHTHALMIC | Status: DC | PRN
  Administered 2016-11-21: 1 via OPHTHALMIC

## 2016-11-21 MED ORDER — SODIUM CHLORIDE 0.9% FLUSH: 3.0000 mL | INTRAVENOUS | Status: DC | PRN

## 2016-11-21 MED ORDER — METHOCARBAMOL 1000 MG/10ML IJ SOLN
500.0000 mg | Freq: Four times a day (QID) | INTRAVENOUS | Status: DC | PRN
  Filled 2016-11-21: qty 5

## 2016-11-21 MED ORDER — PHENYLEPHRINE 40 MCG/ML (10ML) SYRINGE FOR IV PUSH (FOR BLOOD PRESSURE SUPPORT)
PREFILLED_SYRINGE | INTRAVENOUS | Status: DC | PRN
  Administered 2016-11-21: 80 ug via INTRAVENOUS
  Administered 2016-11-21 (×2): 120 ug via INTRAVENOUS

## 2016-11-21 MED ORDER — PANTOPRAZOLE SODIUM 40 MG PO TBEC
40.0000 mg | DELAYED_RELEASE_TABLET | Freq: Every day | ORAL | Status: DC
  Administered 2016-11-22 – 2016-11-23 (×2): 40 mg via ORAL
  Filled 2016-11-21 (×2): qty 1

## 2016-11-21 MED ORDER — SUGAMMADEX SODIUM 200 MG/2ML IV SOLN
INTRAVENOUS | Status: DC | PRN
  Administered 2016-11-21: 200 mg via INTRAVENOUS

## 2016-11-21 MED ORDER — ATORVASTATIN CALCIUM 20 MG PO TABS
20.0000 mg | ORAL_TABLET | Freq: Every day | ORAL | Status: DC
  Administered 2016-11-21 – 2016-11-22 (×2): 20 mg via ORAL
  Filled 2016-11-21 (×2): qty 1

## 2016-11-21 MED ORDER — ROCURONIUM BROMIDE 10 MG/ML (PF) SYRINGE
PREFILLED_SYRINGE | INTRAVENOUS | Status: DC | PRN
  Administered 2016-11-21: 50 mg via INTRAVENOUS
  Administered 2016-11-21: 30 mg via INTRAVENOUS
  Administered 2016-11-21: 20 mg via INTRAVENOUS

## 2016-11-21 MED ORDER — KETOROLAC TROMETHAMINE 30 MG/ML IJ SOLN: 30.0000 mg | Freq: Once | INTRAMUSCULAR | Status: DC | PRN

## 2016-11-21 MED ORDER — SODIUM CHLORIDE 0.9 % IV SOLN
INTRAVENOUS | Status: DC | PRN
  Administered 2016-11-21: 15:00:00 via INTRAVENOUS

## 2016-11-21 MED ORDER — BUPIVACAINE HCL (PF) 0.5 % IJ SOLN
INTRAMUSCULAR | Status: AC
  Filled 2016-11-21: qty 30

## 2016-11-21 MED ORDER — SODIUM CHLORIDE 0.9 % IV SOLN: 250.0000 mL | INTRAVENOUS | Status: DC

## 2016-11-21 MED ORDER — FENTANYL CITRATE (PF) 250 MCG/5ML IJ SOLN
INTRAMUSCULAR | Status: DC | PRN
  Administered 2016-11-21 (×3): 50 ug via INTRAVENOUS
  Administered 2016-11-21: 100 ug via INTRAVENOUS
  Administered 2016-11-21: 50 ug via INTRAVENOUS
  Administered 2016-11-21: 100 ug via INTRAVENOUS

## 2016-11-21 SURGICAL SUPPLY — 69 items
BENZOIN TINCTURE PRP APPL 2/3 (GAUZE/BANDAGES/DRESSINGS) ×3 IMPLANT
BLADE CLIPPER SURG (BLADE) IMPLANT
BUR ROUND FLUTED 4 SOFT TCH (BURR) ×4 IMPLANT
BUR ROUND FLUTED 4MM SOFT TCH (BURR) ×2
CAGE TPAL 9MMX10MMX28MM (Cage) ×1 IMPLANT
CAGE TPAL 9X10X28 (Cage) ×2 IMPLANT
CAP SPINAL LOCKING TI (Cap) ×18 IMPLANT
CLOSURE STERI-STRIP 1/2X4 (GAUZE/BANDAGES/DRESSINGS) ×1
CLSR STERI-STRIP ANTIMIC 1/2X4 (GAUZE/BANDAGES/DRESSINGS) ×2 IMPLANT
CORDS BIPOLAR (ELECTRODE) ×3 IMPLANT
COVER BACK TABLE 80X110 HD (DRAPES) ×3 IMPLANT
COVER SURGICAL LIGHT HANDLE (MISCELLANEOUS) ×3 IMPLANT
DERMABOND ADVANCED (GAUZE/BANDAGES/DRESSINGS) ×2
DERMABOND ADVANCED .7 DNX12 (GAUZE/BANDAGES/DRESSINGS) ×1 IMPLANT
DRAPE C-ARM 42X72 X-RAY (DRAPES) ×3 IMPLANT
DRAPE MICROSCOPE LEICA (MISCELLANEOUS) ×3 IMPLANT
DRAPE SURG 17X23 STRL (DRAPES) ×9 IMPLANT
DRSG EMULSION OIL 3X3 NADH (GAUZE/BANDAGES/DRESSINGS) ×3 IMPLANT
DRSG MEPILEX BORDER 4X4 (GAUZE/BANDAGES/DRESSINGS) ×3 IMPLANT
DRSG MEPILEX BORDER 4X8 (GAUZE/BANDAGES/DRESSINGS) ×3 IMPLANT
DRSG PAD ABDOMINAL 8X10 ST (GAUZE/BANDAGES/DRESSINGS) ×6 IMPLANT
DURAPREP 26ML APPLICATOR (WOUND CARE) ×3 IMPLANT
ELECT BLADE 4.0 EZ CLEAN MEGAD (MISCELLANEOUS) ×3
ELECT CAUTERY BLADE 6.4 (BLADE) ×3 IMPLANT
ELECT REM PT RETURN 9FT ADLT (ELECTROSURGICAL) ×3
ELECTRODE BLDE 4.0 EZ CLN MEGD (MISCELLANEOUS) ×1 IMPLANT
ELECTRODE REM PT RTRN 9FT ADLT (ELECTROSURGICAL) ×1 IMPLANT
EVACUATOR 1/8 PVC DRAIN (DRAIN) ×3 IMPLANT
GLOVE BIOGEL PI IND STRL 8 (GLOVE) ×2 IMPLANT
GLOVE BIOGEL PI INDICATOR 8 (GLOVE) ×4
GLOVE ORTHO TXT STRL SZ7.5 (GLOVE) ×6 IMPLANT
GOWN STRL REUS W/ TWL LRG LVL3 (GOWN DISPOSABLE) ×1 IMPLANT
GOWN STRL REUS W/ TWL XL LVL3 (GOWN DISPOSABLE) ×1 IMPLANT
GOWN STRL REUS W/TWL 2XL LVL3 (GOWN DISPOSABLE) ×3 IMPLANT
GOWN STRL REUS W/TWL LRG LVL3 (GOWN DISPOSABLE) ×2
GOWN STRL REUS W/TWL XL LVL3 (GOWN DISPOSABLE) ×2
HEMOSTAT SURGICEL 2X14 (HEMOSTASIS) IMPLANT
KIT BASIN OR (CUSTOM PROCEDURE TRAY) ×3 IMPLANT
KIT POSITION SURG JACKSON T1 (MISCELLANEOUS) ×3 IMPLANT
KIT ROOM TURNOVER OR (KITS) ×3 IMPLANT
MANIFOLD NEPTUNE II (INSTRUMENTS) ×3 IMPLANT
MATRIX STARDRIVE (MISCELLANEOUS) ×12 IMPLANT
NDL SUT .5 MAYO 1.404X.05X (NEEDLE) ×1 IMPLANT
NEEDLE MAYO TAPER (NEEDLE) ×2
NS IRRIG 1000ML POUR BTL (IV SOLUTION) ×3 IMPLANT
PACK LAMINECTOMY ORTHO (CUSTOM PROCEDURE TRAY) ×3 IMPLANT
PAD ARMBOARD 7.5X6 YLW CONV (MISCELLANEOUS) ×6 IMPLANT
PATTIES SURGICAL .5 X.5 (GAUZE/BANDAGES/DRESSINGS) IMPLANT
PATTIES SURGICAL .75X.75 (GAUZE/BANDAGES/DRESSINGS) ×3 IMPLANT
ROD 5.5X65MM (Rod) ×3 IMPLANT
ROD 5.5X70MM (Rod) ×3 IMPLANT
SCREW MATRIX MIS 6.0X45MM (Screw) ×9 IMPLANT
SPONGE LAP 18X18 X RAY DECT (DISPOSABLE) IMPLANT
SPONGE LAP 4X18 X RAY DECT (DISPOSABLE) ×6 IMPLANT
SPONGE SURGIFOAM ABS GEL 100 (HEMOSTASIS) IMPLANT
STAPLER VISISTAT 35W (STAPLE) IMPLANT
SURGIFLO W/THROMBIN 8M KIT (HEMOSTASIS) ×3 IMPLANT
SUT BONE WAX W31G (SUTURE) ×3 IMPLANT
SUT VIC AB 2-0 CT1 27 (SUTURE) ×2
SUT VIC AB 2-0 CT1 TAPERPNT 27 (SUTURE) ×1 IMPLANT
SUT VICRYL 0 TIES 12 18 (SUTURE) ×3 IMPLANT
SUT VICRYL 4-0 PS2 18IN ABS (SUTURE) IMPLANT
SUT VICRYL AB 2 0 TIES (SUTURE) ×3 IMPLANT
TAP CANN 5MM (TAP) ×3 IMPLANT
TOWEL OR 17X24 6PK STRL BLUE (TOWEL DISPOSABLE) ×3 IMPLANT
TOWEL OR 17X26 10 PK STRL BLUE (TOWEL DISPOSABLE) ×3 IMPLANT
TRAY FOLEY W/METER SILVER 16FR (SET/KITS/TRAYS/PACK) ×3 IMPLANT
WATER STERILE IRR 1000ML POUR (IV SOLUTION) ×3 IMPLANT
YANKAUER SUCT BULB TIP NO VENT (SUCTIONS) ×3 IMPLANT

## 2016-11-21 NOTE — Progress Notes (Signed)
Patient ID: Christina Booker, female   DOB: 08/22/1940, 76 y.o.   MRN: [id number] PATIENT HAS HER OLD LUMBAR BRACE STILL IN GOOD CONDITION , HUSBAND BRINGING TO THE ROOM OF THE PATIENT TONIGHT

## 2016-11-21 NOTE — Anesthesia Preprocedure Evaluation (Addendum)
Anesthesia Evaluation  Patient identified by MRN, date of birth, ID band Patient awake    Reviewed: Allergy & Precautions, NPO status , Patient's Chart, lab work & pertinent test results  History of Anesthesia Complications (+) PONV and history of anesthetic complications  Airway Mallampati: II   Neck ROM: Full    Dental no notable dental hx. (+) Caps, Dental Advisory Given   Pulmonary former smoker,    Pulmonary exam normal breath sounds clear to auscultation       Cardiovascular hypertension, Pt. on medications + Peripheral Vascular Disease  Normal cardiovascular exam Rhythm:Regular Rate:Normal     Neuro/Psych  Neuromuscular disease    GI/Hepatic Neg liver ROS, GERD  Medicated and Controlled,  Endo/Other  negative endocrine ROS  Renal/GU negative Renal ROS     Musculoskeletal  (+) Arthritis , Osteoarthritis,    Abdominal (+) + obese,   Peds  Hematology   Anesthesia Other Findings   Reproductive/Obstetrics                             Anesthesia Physical  Anesthesia Plan  ASA: III  Anesthesia Plan: General   Post-op Pain Management:    Induction: Intravenous  PONV Risk Score and Plan: 4 or greater and Ondansetron, Dexamethasone, Propofol, Midazolam, Treatment may vary due to age or medical condition and Scopolamine patch - Pre-op  Airway Management Planned: Oral ETT  Additional Equipment:   Intra-op Plan:   Post-operative Plan: Extubation in OR  Informed Consent: I have reviewed the patients History and Physical, chart, labs and discussed the procedure including the risks, benefits and alternatives for the proposed anesthesia with the patient or authorized representative who has indicated his/her understanding and acceptance.   Dental advisory given  Plan Discussed with: CRNA and Surgeon  Anesthesia Plan Comments:        Anesthesia Quick Evaluation

## 2016-11-21 NOTE — Interval H&P Note (Signed)
History and Physical Interval Note:  11/21/2016 12:16 PM  [name]  has presented today for surgery, with the diagnosis of L3-4 Stenosis, Intraspinal Extradural Cyst  The various methods of treatment have been discussed with the patient and family. After consideration of risks, benefits and other options for treatment, the patient has consented to  Procedure(s): Right L3-4 Transforaminal Lumbar Interbody Fusion, Removal Intraspinal Extradural Cyst, Pedicle Instrumentation, Bilateral Lateral Fusion (N/A) as a surgical intervention .  The patient's history has been reviewed, patient examined, no change in status, stable for surgery.  I have reviewed the patient's chart and labs.  Questions were answered to the patient's satisfaction.     Christina Booker

## 2016-11-21 NOTE — H&P (Signed)
Christina Booker is an 76 y.o. female.   Chief Complaint: Low back pain, neurogenic claudication, right lower extremity radiculopathy HPI: Patient with history of L3-4 spinal stenosis and the above complaint presents to the hospital today for surgical intervention. Progressively worsening symptoms. Failed conservative treatment.  Past Medical History:  Diagnosis Date  . Arthritis    hands, back  . Cervical spinal stenosis   . Complication of anesthesia   . Constipation   . Headache   . Hot flashes   . Hyperlipidemia   . Hypertension   . Irritable bowel syndrome   . PONV (postoperative nausea and vomiting)   . Rosacea   . Stroke [facility]) 12/01/2009   tia    Past Surgical History:  Procedure Laterality Date  . ABDOMINAL HYSTERECTOMY    . ANTERIOR CERVICAL DECOMP/DISCECTOMY FUSION N/A 09/07/2016   Procedure: C7-T1 Anterior Cervical Discectomy and Fusion, Allograft, Plate;  Surgeon: Marybelle Killings, MD;  Location: [facility];  Service: Orthopedics;  Laterality: N/A;  . BACK SURGERY    . CARPAL TUNNEL RELEASE Right 2009  . CARPAL TUNNEL RELEASE Left   . CATARACT EXTRACTION, BILATERAL    . CERVICAL DISCECTOMY  09/07/2016   C7-T1 Anterior Cervical Discectomy and Fusion, Allograft, Plate (N/A)  . EYE SURGERY    . GANGLION CYST EXCISION Left 12/22/2014   Procedure: Excision Left Hand Ganglion cyst;  Surgeon: Marybelle Killings, MD;  Location: [facility];  Service: Orthopedics;  Laterality: Left;  . HEMORRHOID SURGERY    . TUBAL LIGATION      Family History  Problem Relation Age of Onset  . Cancer Mother   . Uterine cancer Mother   . Hypertension Mother   . Heart attack Father    Social History:  reports that she quit smoking about 24 years ago. She has never used smokeless tobacco. She reports that she drinks about 2.4 oz of alcohol per week . She reports that she does not use drugs.  Allergies:  Allergies  Allergen Reactions  . Penicillins Rash    Has patient had a PCN  reaction causing immediate rash, facial/tongue/throat swelling, SOB or lightheadedness with hypotension: Yes Has patient had a PCN reaction causing severe rash involving mucus membranes or skin necrosis: No Has patient had a PCN reaction that required hospitalization: No Has patient had a PCN reaction occurring within the last 10 years: No If all of the above answers are "NO", then may proceed with Cephalosporin use.  . Methocarbamol Nausea Only  . Other Other (See Comments)    Anesthesia--nausea/vomiting  . Oxycodone Nausea Only    Medications Prior to Admission  Medication Sig Dispense Refill  . acetaminophen (TYLENOL) 325 MG tablet Take 650 mg by mouth every 6 (six) hours as needed (for pain.).     Marland Kitchen aspirin EC 81 MG tablet Take 81 mg by mouth at bedtime.     Marland Kitchen atorvastatin (LIPITOR) 20 MG tablet Take 20 mg by mouth at bedtime.     Marland Kitchen buPROPion (WELLBUTRIN XL) 150 MG 24 hr tablet Take 150 mg by mouth every morning.    . Cholecalciferol (VITAMIN D3) 1000 units CAPS Take 1,000 Units by mouth 2 (two) times daily.    Marland Kitchen dicyclomine (BENTYL) 10 MG capsule Take 10 mg by mouth 2 (two) times daily as needed for spasms.    Marland Kitchen glycerin adult 2 G SUPP Place 1 suppository rectally 3 (three) times daily as needed (constipation).    Marland Kitchen HYDROcodone-acetaminophen (NORCO/VICODIN) 5-325  MG tablet Take 1-2 tablets by mouth every 4 (four) hours as needed. (Patient taking differently: Take 1-2 tablets by mouth every 6 (six) hours as needed (for pain). ) 20 tablet 0  . lisinopril (PRINIVIL,ZESTRIL) 5 MG tablet Take 5 mg by mouth at bedtime.     . pantoprazole (PROTONIX) 40 MG tablet Take 40 mg by mouth daily before breakfast.    . polyethylene glycol powder (GLYCOLAX/MIRALAX) powder Take 17 g by mouth daily as needed (for constipation.).     Marland Kitchen Polyvinyl Alcohol-Povidone (FRESHKOTE OP) Place 2 drops into both eyes daily as needed (dry eyes).    . Probiotic Product (PROBIOTIC PO) Take 1 capsule by mouth daily.    .  promethazine (PHENERGAN) 12.5 MG tablet Take 1 tablet (12.5 mg total) by mouth every 6 (six) hours as needed for nausea or vomiting. 20 tablet 00  . traMADol (ULTRAM) 50 MG tablet Take one tablet three times daily as needed for pain (Patient taking differently: Take 50 mg by mouth 3 (three) times daily as needed (for pain.). ) 30 tablet 0  . Turmeric Curcumin 500 MG CAPS Take 1,000 mg by mouth 2 (two) times daily.     . Wheat Dextrin (BENEFIBER PO) Take 7.5-15 mLs by mouth every evening.     . zolpidem (AMBIEN) 10 MG tablet Take 10 mg by mouth at bedtime as needed for sleep.      No results found for this or any previous visit (from the past 48 hour(s)). No results found.  Review of Systems  Constitutional: Negative.   HENT: Negative.   Respiratory: Negative.   Cardiovascular: Negative.   Gastrointestinal: Negative.   Musculoskeletal: Positive for back pain.  Skin: Negative.   Neurological: Positive for tingling.  Psychiatric/Behavioral: Negative.     Blood pressure (!) 154/70, pulse 66, temperature 97.9 F (36.6 C), temperature source Oral, resp. rate 20, weight 165 lb (74.8 kg), SpO2 98 %. Physical Exam  Constitutional: She is oriented to person, place, and time. She appears well-developed. No distress.  HENT:  Head: Normocephalic and atraumatic.  Eyes: Pupils are equal, round, and reactive to light. EOM are normal.  Neck: Normal range of motion.  Respiratory: No respiratory distress.  GI: She exhibits no distension.  Musculoskeletal:  Musculoskeletal:  Well-healed anterior incision left side of her neck from the surgery 6 weeks ago. She has good cervical range of motion just coming out of the collar. No well quad EHL weakness. Distal pulses are intact negative Homans sign. No rash or exposed skin she has some sciatic notch tenderness on the right negative on the left.   Neurological: She is alert and oriented to person, place, and time.  Skin: Skin is warm and dry.   Psychiatric: She has a normal mood and affect.     Assessment/Plan  L3-4 stenosis, low back pain and right lower extremity radiculopathy    We will proceed with Right L3-4 Transforaminal Lumbar Interbody Fusion, Removal Intraspinal Extradural Cyst, Pedicle Instrumentation, Bilateral Lateral Fusion as scheduled. Surgical procedure along with possible risks and complications discussed. All questions answered.  Benjiman Core, PA-C 11/21/2016, 12:19 PM

## 2016-11-21 NOTE — H&P (View-Only) (Signed)
Office Visit Note   Patient: Christina Booker           Date of Birth: 08/10/1940           MRN: [id number] Visit Date: 11/08/2016              Requested by: Raina Mina., MD [address] [place], [place] [address] PCP: Raina Mina., MD   Assessment & Plan: Visit Diagnoses:  1. History of fusion of cervical spine     Plan: CT scan was reviewed. I discussed with her that I think the CT scan shows satisfactory positioning. Screws are close to the endplate but her in the vertebral body and not near the adjacent levels. We'll proceed with that the Lumbar fusion is scheduled next month for severe stenosis and neurogenic claudication symptoms.  Follow-Up Instructions: No Follow-up on file.   Orders:  No orders of the defined types were placed in this encounter.  No orders of the defined types were placed in this encounter.     Procedures: No procedures performed   Clinical Data: No additional findings.   Subjective: Chief Complaint  Patient presents with  . Neck - Pain, Follow-up    HPI patient turned she's had the CT scan ordered by her primary caregiver head for purposes of headache. CT scan was read as possibly some graft subsidence and I reviewed the scans. We discussed the entry to the C7-T1 being barely above the clavicle and demonstrated this on the scans. She does not have any narrowing of spinous process gap posteriorly and it appears that the graft is sitting where it was previously. Poor visualization on Intra-Op and postop x-rays due to overlying ribs and clavicle. She's not having any arm pain just has headaches late in the afternoon. She has significant spinal stenosis with neurogenic claudication and is scheduled for decompression surgery for a significant synovial cyst and severe spinal stenosis at L3-4 above her Solid fusion at L4-5  from 2015.  Review of Systems unchanged from last office visit   Objective: Vital Signs: There were no vitals taken  for this visit.  Physical Exam  Constitutional: She is oriented to person, place, and time. She appears well-developed.  HENT:  Head: Normocephalic.  Right Ear: External ear normal.  Left Ear: External ear normal.  Eyes: Pupils are equal, round, and reactive to light.  Neck: No tracheal deviation present. No thyromegaly present.  Cardiovascular: Normal rate.   Pulmonary/Chest: Effort normal.  Abdominal: Soft.  Neurological: She is alert and oriented to person, place, and time.  Skin: Skin is warm and dry.  Psychiatric: She has a normal mood and affect. Her behavior is normal.   upper extremity reflexes are intact states strands are intact anterior cervical incisions well-healed. No pedal flexion-extension.  Ortho Exam  Specialty Comments:  No specialty comments available.  Imaging: No results found.   PMFS History: Patient Active Problem List   Diagnosis Date Noted  . History of fusion of cervical spine 10/11/2016  . Cervical spinal stenosis 09/07/2016  . Spinal stenosis of lumbar region with neurogenic claudication 06/01/2016  . Cervical radiculopathy 05/12/2016  . Spinal stenosis of cervical region 05/12/2016  . Cervicalgia 05/12/2016  . Ganglion of left hand 12/22/2014  . S/P lumbar fusion 04/16/2014   Past Medical History:  Diagnosis Date  . Arthritis    hands, back  . Cervical spinal stenosis   . Complication of anesthesia   . Constipation   . Hot  flashes   . Hyperlipidemia   . Hypertension   . Irritable bowel syndrome   . PONV (postoperative nausea and vomiting)   . Rosacea   . Stroke [facility]) 12/01/2009   tia    Family History  Problem Relation Age of Onset  . Cancer Mother   . Uterine cancer Mother   . Hypertension Mother   . Heart attack Father     Past Surgical History:  Procedure Laterality Date  . ABDOMINAL HYSTERECTOMY    . ANTERIOR CERVICAL DECOMP/DISCECTOMY FUSION N/A 09/07/2016   Procedure: C7-T1 Anterior Cervical Discectomy and Fusion,  Allograft, Plate;  Surgeon: Marybelle Killings, MD;  Location: [facility];  Service: Orthopedics;  Laterality: N/A;  . BACK SURGERY    . CARPAL TUNNEL RELEASE Right 2009  . CARPAL TUNNEL RELEASE Left   . CATARACT EXTRACTION, BILATERAL    . CERVICAL DISCECTOMY  09/07/2016   C7-T1 Anterior Cervical Discectomy and Fusion, Allograft, Plate (N/A)  . EYE SURGERY    . GANGLION CYST EXCISION Left 12/22/2014   Procedure: Excision Left Hand Ganglion cyst;  Surgeon: Marybelle Killings, MD;  Location: [facility];  Service: Orthopedics;  Laterality: Left;  . HEMORRHOID SURGERY    . TUBAL LIGATION     Social History   Occupational History  . Not on file.   Social History Main Topics  . Smoking status: Former Smoker    Quit date: 04/09/1992  . Smokeless tobacco: Never Used  . Alcohol use 2.4 oz/week    4 Glasses of wine per week     Comment: 4  . Drug use: No  . Sexual activity: Not on file

## 2016-11-21 NOTE — Anesthesia Procedure Notes (Signed)
Procedure Name: Intubation Date/Time: 11/21/2016 1:02 PM Performed by: Mervyn Gay Pre-anesthesia Checklist: Patient identified, Patient being monitored, Timeout performed, Emergency Drugs available and Suction available Patient Re-evaluated:Patient Re-evaluated prior to induction Oxygen Delivery Method: Circle System Utilized Preoxygenation: Pre-oxygenation with 100% oxygen Induction Type: IV induction Ventilation: Mask ventilation without difficulty Laryngoscope Size: Miller and 3 Grade View: Grade I Tube type: Oral Tube size: 7.5 mm Number of attempts: 1 Airway Equipment and Method: Stylet Placement Confirmation: ETT inserted through vocal cords under direct vision,  positive ETCO2 and breath sounds checked- equal and bilateral Secured at: 20 cm Tube secured with: Tape Dental Injury: Teeth and Oropharynx as per pre-operative assessment

## 2016-11-21 NOTE — Transfer of Care (Signed)
Immediate Anesthesia Transfer of Care Note  Patient: Christina Booker  Procedure(s) Performed: Procedure(s): Right L3-4 Transforaminal Lumbar Interbody Fusion, Removal Intraspinal Extradural Cyst, Pedicle Instrumentation and exchange, Bilateral Lateral Fusion (N/A)  Patient Location: PACU  Anesthesia Type:General  Level of Consciousness: awake, alert , oriented and patient cooperative  Airway & Oxygen Therapy: Patient Spontanous Breathing and Patient connected to nasal cannula oxygen  Post-op Assessment: Report given to RN and Post -op Vital signs reviewed and stable  Post vital signs: Reviewed and stable  Last Vitals:  Vitals:   11/21/16 1015  BP: (!) 154/70  Pulse: 66  Resp: 20  Temp: 36.6 C  SpO2: 98%    Last Pain:  Vitals:   11/21/16 1015  TempSrc: Oral  PainSc:       Patients Stated Pain Goal: 3 (54/00/86 7619)  Complications: No apparent anesthesia complications

## 2016-11-21 NOTE — Op Note (Signed)
Preop diagnosis: Spinal stenosis at L3-4 with intraspinal extradural facet cyst and multifactorial spinal stenosis with neurogenic claudication and previous instrumented fusion at L4-5 which is a solid fusion.  Postop diagnosis same  Procedure: Removal of intraspinal extradural facet cyst, decompression L3-4 with right T left with 9 mm T-PAL lordotic cage local bone graft, bilateral lateral intertransverse process fusion. Removal of pedicle hardware previous rods. Exchange of left L5 pedicle screw due to broken screw Necessitating cutting the rod for rod removal, pedicle instrumentation L3-L5 with 70 mm rod right, 65 mm rod left. 6 x 45 mm screws at L3 right and left. (One level L3-4 fusion , TLIF and bilateral lateral fusion ) upper microscope used for removal of intraspinal extradural facet cyst and decompression.  Surgeon: Rodell Perna M.D.  Anesthesia: Gen.  Assistant: Benjiman Core PA-C medically necessary and present for the entire procedure  EBL 420ml  with re-transfusion Cell Saver 425 mL  Complication: Screw On the left L5 pedicle screw could not be removed 3 screwdrivers were broken before finally the rod was cut and screw was removed new screw was placed since the old screw had a screw That could not be removed and was still attached to small piece of broken rod.  Brief history: This 76 year old female previously had decompression Gill procedure for instability at L4-5 with severe spinal stenosis and did well for 2 years and then developed progressive spinal stenosis with an intraspinal extradural facet cyst which was new with multifactorial stenosis which was severe with neurogenic claudication. Operative plan was for removal of the screw caps add additional cage after decompression and removal of the extradural intraspinal facet cyst with decompression L3-4 and fusion on the right which was the side where the cyst was present.  Procedure after induction general anesthesia due to patient's  penicillin allergy she received vancomycin. Careful padding positioning of the spine frame extra pads underneath the shoulders padding the ulnar nerve Pumpers were applied and vancomycin was given prophylactically timeout procedure was completed and the patient had her back marked before the procedure. DuraPrep was an towels sterile skin marker on the old incision and Betadine Steri-Drape was applied with laminectomy sheet. Midline incision was opened. Subperiosteal down to the spinous process of L5 and of the top of the spinous processes of L3 was performed. Coker clamps were placed at the level of plan decompression after the pedicle screw instrumentation at L4-5 was identified and the spinous process and transverse process is identified at L3. 2 Coker clamps are placed in a lateral C-arm image was taken of the plan level for decompression confirming appropriate level. Posterior elements were removed facet cyst was visualized after lamina was removed. Bone was cleaned meticulously by the scrub nurse for decompression was performed peeled off the dura with careful protection of the dura decompressing and removing chunks of thick ligament and hypertrophic overhanging spurs in the lateral gutter. Disc was visualized on the right side after complete removal of the facet cyst. Since this came out of facet joint and nerve root above disc space was visualized and the nerve root below going below the pedicle on the right side was decompressed. [place] 4 was placed in through 4 level seconds problems confirmed appropriate level. Discectomy was performed to recovery retractor was used to protect the dura. Patient previously had a 10 mm cage at the L4-5 level which had healed and was solid. Discectomy was performed using curettes rasps, ring curettes, angled curettes and angled Rasps. Once the disc was  removed with intact anterior ligaments visualized with operative microscope with some bleeding endplates trial sizers were  used progressing up to 9 which gave an excellent tight fit visualized under C-arm with trials inserted in good position. Decorticated bone that had been removed from the facet joint on the right at L3-4 Lasara for the transforaminal interbody fusion was meticulously packed and small little pieces down inside the disc space followed by bone packed in the cage cage was gradually inserted checked under C-arm countersunk kicked over and good position with good bone quality visualized anterior to the cage. Cage was countersunk 4 mm and was kicked over and was a banana-shaped cage of PEEK packed with bone. Next soft tissues cleaned off over the rods both right and left. Screw caps came off easily and the rod came off easily on the right. L4, came off easily. L5 Was stuck in standard fashion was tried to be tightened slightly and then backed out and I broke 3 screwdrivers each time finding the tip still sitting inside the It was removed. 2 different large rod cutters were obtained following 1 fit once soft tissue been removed for exposure and the rod was cut was some difficulty once the rod was cut screw was able to eat twisted out with the Still attached to cut patient piece of the rod. Since Cannot come off in a screw was placed 6 x 45. Fusion was solid there was no motion and previous x-ray showed solid fusion. There is 6 x 45 screw was placed at this level and then rods were selected appropriate length with a 70 mm rod on the right side 65 mm rod on the left side. New prescription screw caps were applied and the L3-4-5 levels compressed first on the right side where the cage been inserted followed by the left side. We were unable to compress any of the L4-5 level since it was solid but the rod compress was used with no motion and screws were locked down at L5. There was 1-2 mm rod sticking out on the end of the right and left sides. Final spot pictures were taken an angle shots relook down the pedicle. There was  slightly more convergent but the starting point was slightly more lateral at the L3 level and the medial aspect of both pedicle was carefully inspected and palpated with the [facility] and there was no violation of the floor or the medial wall of the pedicle and no violation the inferior wall the pedicle. There was good convergence. Copious irrigation was performed. Both gutters were checked to make sure there is no pieces of bone and no thrombin Gelfoam left. Repeat irrigation followed by standard closure with #1 Vicryl the deep fascia a few 0 Vicryls 2-0 Vicryl subtendinous tissue. 4-0 subcuticular closure Dermabond on the skin postop dressing and transferred to the recovery room in stable condition where she was neurologically intact.  Patient tolerated the procedure well had intact motor and sensation and numbness in her legs in the recovery room and tolerated the procedure.

## 2016-11-22 LAB — BASIC METABOLIC PANEL
Anion gap: 4 — ABNORMAL LOW (ref 5–15)
BUN: 7 mg/dL (ref 6–20)
CHLORIDE: 108 mmol/L (ref 101–111)
CO2: 25 mmol/L (ref 22–32)
CREATININE: 0.87 mg/dL (ref 0.44–1.00)
Calcium: 8.4 mg/dL — ABNORMAL LOW (ref 8.9–10.3)
GFR calc Af Amer: >60 mL/min (ref >60)
GFR calc non Af Amer: >60 mL/min (ref >60)
Glucose, Bld: 170 mg/dL — ABNORMAL HIGH (ref 65–99)
Potassium: 4.2 mmol/L (ref 3.5–5.1)
SODIUM: 137 mmol/L (ref 135–145)

## 2016-11-22 LAB — CBC
HCT: 35.9 % — ABNORMAL LOW (ref 36.0–46.0)
Hemoglobin: 11.5 g/dL — ABNORMAL LOW (ref 12.0–15.0)
MCH: 28.3 pg (ref 26.0–34.0)
MCHC: 32 g/dL (ref 30.0–36.0)
MCV: 88.4 fL (ref 78.0–100.0)
PLATELETS: 164 10*3/uL (ref 150–400)
RBC: 4.06 MIL/uL (ref 3.87–5.11)
RDW: 14.2 % (ref 11.5–15.5)
WBC: 9.9 10*3/uL (ref 4.0–10.5)

## 2016-11-22 MED ORDER — OXYCODONE-ACETAMINOPHEN 5-325 MG PO TABS
1.0000 | ORAL_TABLET | ORAL | Status: DC | PRN
  Administered 2016-11-22 – 2016-11-23 (×2): 1 via ORAL
  Administered 2016-11-23: 2 via ORAL
  Filled 2016-11-22: qty 1
  Filled 2016-11-22: qty 2
  Filled 2016-11-22: qty 1

## 2016-11-22 NOTE — Progress Notes (Signed)
Occupational Therapy Evaluation Patient Details Name: Christina Booker MRN: [id number] DOB: 08-22-1940 Today's Date: 11/22/2016    History of Present Illness Patient presents s/p Right L3-4 Transforaminal Lumbar Interbody Fusion, Removal Intraspinal Extradural Cyst, Pedicle Instrumentation and exchange, Bilateral Lateral Fusion.    Clinical Impression   PTA, pt lived at home with husband who assisted as needed with ADL, however, pt states overall "I'm very independent if I'm not having surgery". Began education on back precautions with ADL and functional mobility for ADL. Pt appeared confused at times -? Related to medicine. Pt unable to recall any back precautions from PT session immediately before OT session. Pt less confused and able to recall precautions at end of session. Will follow acutely to facilitate safe DC home with initial 24/7 S. Family  Present for session and verbalized understanding.     Follow Up Recommendations  No OT follow up;Supervision/Assistance - 24 hour    Equipment Recommendations  None recommended by OT    Recommendations for Other Services       Precautions / Restrictions Precautions Precautions: Back Precaution Booklet Issued: Yes (comment) Required Braces or Orthoses: Spinal Brace Spinal Brace: Applied in sitting position;Lumbar corset Restrictions Weight Bearing Restrictions: No      Mobility Bed Mobility Pt OOB     Transfers Overall transfer level: Needs assistance Equipment used: Rolling walker (2 wheeled) Transfers: Sit to/from Stand Sit to Stand: Min guard    vc for correct positioning and back precautions          Balance Overall balance assessment: Needs assistance Sitting-balance support: Single extremity supported Sitting balance-Leahy Scale: Fair Sitting balance - Comments: patient able to maintain balance when donning back brace but ocassionally required single UE support for added balance.   Standing balance support:  Bilateral upper extremity supported Standing balance-Leahy Scale: Fair Standing balance comment: Able to stand at sink without BUE support. Ambulated to chair  Around bed with HHA                           ADL either performed or assessed with clinical judgement   ADL Overall ADL's : Needs assistance/impaired     Grooming: Standing;Min guard Grooming Details (indicate cue type and reason): educated on proper technqieu for oral care Upper Body Bathing: Set up;Supervision/ safety;Sitting   Lower Body Bathing: Moderate assistance;Sit to/from stand   Upper Body Dressing : Minimal assistance Upper Body Dressing Details (indicate cue type and reason): a to properly donn corsett Lower Body Dressing: Moderate assistance;Sit to/from stand Lower Body Dressing Details (indicate cue type and reason): pt unable to cross feet over knees Toilet Transfer: Min guard;RW;BSC;Ambulation   Toileting- Clothing Manipulation and Hygiene: Moderate assistance;Sit to/from stand Toileting - Clothing Manipulation Details (indicate cue type and reason): Pt unable to reach bottom for pericare. Educated on use of AE     Functional mobility during ADLs: Min guard;Rolling walker;Cueing for safety General ADL Comments: Began education on compensatory techniques, use of AE and DME and back precautions for ADL.     Vision         Perception     Praxis      Pertinent Vitals/Pain Pain Assessment: 0-10 Pain Score: 4   Pain Location: back  Pain Descriptors / Indicators: Discomfort;Aching Pain Intervention(s): Limited activity within patient's tolerance     Hand Dominance Right   Extremity/Trunk Assessment Upper Extremity Assessment Upper Extremity Assessment: Overall WFL for tasks assessed   Lower Extremity  Assessment Lower Extremity Assessment: Defer to PT evaluation   Cervical / Trunk Assessment Cervical / Trunk Assessment: Kyphotic (patient had cervical procedure performed several  months ago.)   Communication Communication Communication: No difficulties   Cognition Arousal/Alertness: Awake/alert Behavior During Therapy: WFL for tasks assessed/performed Overall Cognitive Status: Impaired/Different from baseline Area of Impairment: Memory;Attention                   Current Attention Level: Selective Memory: Decreased short-term memory;Decreased recall of precautions         General Comments: Most likely related to medicaiton    General Comments  Drainage from incision site noted, nurse was notified.    Exercises     Shoulder Instructions      Home Living Family/patient expects to be discharged to:: Private residence Living Arrangements: Spouse/significant other Available Help at Discharge: Family;Available 24 hours/day;Available PRN/intermittently (Patient's daughter is available PRN. Patient's husband is retired and Engineer, mining.) Type of Home: House Home Access: Stairs to enter Technical brewer of Steps: 4 Entrance Stairs-Rails: Can reach both [place]: One level     Bathroom Shower/Tub: Occupational psychologist: Standard Bathroom Accessibility: Yes How Accessible: Accessible via [place]: [place] - 2 wheels;Bedside commode;Hand held shower head   Additional Comments: Patient uses bedside commode as shower chair.      Prior Functioning/Environment Level of Independence: Needs assistance (Husband has been helping with washing back. Husband has also been cooking and and doing many ADL's for the last 10 weeks as patient recovered from cervical procedure.)                 OT Problem List: Decreased strength;Decreased range of motion;Decreased activity tolerance;Impaired balance (sitting and/or standing);Decreased safety awareness;Decreased knowledge of use of DME or AE;Decreased knowledge of precautions;Obesity;Pain      OT Treatment/Interventions: Self-care/ADL training;DME and/or AE  instruction;Therapeutic activities;Patient/family education    OT Goals(Current goals can be found in the care plan section) Acute Rehab OT Goals Patient Stated Goal: Go back home and not have back pain. OT Goal Formulation: With patient Time For Goal Achievement: 11/29/16 Potential to Achieve Goals: Good ADL Goals Pt Will Perform Lower Body Dressing: with min assist;with caregiver independent in assisting;sit to/from stand Pt Will Transfer to Toilet: with supervision;ambulating;bedside commode (with caregiver independent in assisting) Pt Will Perform Toileting - Clothing Manipulation and hygiene: with supervision;with set-up;with adaptive equipment;with caregiver independent in assisting;sitting/lateral leans;sit to/from stand Additional ADL Goal #1: Pt will independently states 3/3/ back precautions for ADL  OT Frequency: Min 2X/week   Barriers to D/C:            Co-evaluation              AM-PAC PT "6 Clicks" Daily Activity     Outcome Measure Help from another person eating meals?: None Help from another person taking care of personal grooming?: A Little Help from another person toileting, which includes using toliet, bedpan, or urinal?: A Lot Help from another person bathing (including washing, rinsing, drying)?: A Lot Help from another person to put on and taking off regular upper body clothing?: A Little Help from another person to put on and taking off regular lower body clothing?: A Lot 6 Click Score: 16   End of Session Equipment Utilized During Treatment: Gait belt;Rolling walker;Back brace Nurse Communication: Mobility status  Activity Tolerance: Patient tolerated treatment well Patient left: in chair;with call bell/phone within reach;with family/visitor present  OT Visit Diagnosis: Unsteadiness on  feet (R26.81);Muscle weakness (generalized) (M62.81);Pain Pain - part of body:  (back)                Time: 1975-8832 OT Time Calculation (min): 25 min Charges:   OT General Charges $OT Visit: 1 Visit OT Evaluation $OT Eval Low Complexity: 1 Low OT Treatments $Self Care/Home Management : 8-22 mins G-Codes:     [facility], OT/L  [phone] 11/22/2016  Christina Booker,Christina Booker 11/22/2016, 9:29 AM

## 2016-11-22 NOTE — Progress Notes (Signed)
   Subjective: 1 Day Post-Op Procedure(s) (LRB): Right L3-4 Transforaminal Lumbar Interbody Fusion, Removal Intraspinal Extradural Cyst, Pedicle Instrumentation and exchange, Bilateral Lateral Fusion (N/A) Patient reports pain as mild.  Was OOB at 2 AM already. No leg pain.   Objective: Vital signs in last 24 hours: Temp:  [97.5 F (36.4 C)-98.3 F (36.8 C)] 98.3 F (36.8 C) (09/11 8811) Pulse Rate:  [56-72] 64 (09/11 0638) Resp:  [7-20] 16 (09/11 0638) BP: (88-154)/(56-82) 88/56 (09/11 0638) SpO2:  [93 %-100 %] 95 % (09/11 0315) Weight:  [165 lb (74.8 kg)] 165 lb (74.8 kg) (09/10 1015)  Intake/Output from previous day: 09/10 0701 - 09/11 0700 In: 2790 [P.O.:240; I.V.:2400; Blood:150] Out: 1450 [Urine:950; Blood:500] Intake/Output this shift: No intake/output data recorded.   Recent Labs  11/22/16 0211  HGB 11.5*    Recent Labs  11/22/16 0211  WBC 9.9  RBC 4.06  HCT 35.9*  PLT 164    Recent Labs  11/22/16 0211  NA 137  K 4.2  CL 108  CO2 25  BUN 7  CREATININE 0.87  GLUCOSE 170*  CALCIUM 8.4*   No results for input(s): LABPT, INR in the last 72 hours.  Neurologically intact Dg Lumbar Spine 2-3 Views  Result Date: 11/21/2016 CLINICAL DATA:  L3-4 TLIF EXAM: LUMBAR SPINE - 2-3 VIEW; DG C-ARM 61-120 MIN COMPARISON:  04/27/2016 lumbar spine radiographs FINDINGS: New interbody block at L3-4 with posterior spinal fusion hardware is noted. Pre-existing L4-5 posterior spinal fusion hardware with interbody block is again seen without significant change. IMPRESSION: Lumbar spinal fusion hardware now spans L3 through L5 status post TLIF at L3-4. No immediate complications identified. Electronically Signed   By: Ashley Royalty M.D.   On: 11/21/2016 20:15   Dg C-arm 61-120 Min  Result Date: 11/21/2016 CLINICAL DATA:  L3-4 TLIF EXAM: LUMBAR SPINE - 2-3 VIEW; DG C-ARM 61-120 MIN COMPARISON:  04/27/2016 lumbar spine radiographs FINDINGS: New interbody block at L3-4 with  posterior spinal fusion hardware is noted. Pre-existing L4-5 posterior spinal fusion hardware with interbody block is again seen without significant change. IMPRESSION: Lumbar spinal fusion hardware now spans L3 through L5 status post TLIF at L3-4. No immediate complications identified. Electronically Signed   By: Ashley Royalty M.D.   On: 11/21/2016 20:15    Assessment/Plan: 1 Day Post-Op Procedure(s) (LRB): Right L3-4 Transforaminal Lumbar Interbody Fusion, Removal Intraspinal Extradural Cyst, Pedicle Instrumentation and exchange, Bilateral Lateral Fusion (N/A) Up with therapy . Less pain than expected. Likely home tomorrow.   Christina Booker 11/22/2016, 8:20 AM

## 2016-11-22 NOTE — Progress Notes (Signed)
Physical Therapy Evaluation Patient Details Name: Christina Booker MRN: [id number] DOB: [birth year] Today's Date: 11/22/2016   History of Present Illness  Patient is a 76 yo female who presents s/p Right L3-4 Transforaminal Lumbar Interbody Fusion, Removal Intraspinal Extradural Cyst, Pedicle Instrumentation and exchange, Bilateral Lateral Fusion (11/21/16).   Clinical Impression  Patient presents with above procedure. Patient demonstrates the following deficits: decreased mobility, increased pain, decreased independence, decreased postural awareness. Patient able to ambulate a distance of 300 feet with a rolling walker, with several standing rest breaks. Patient was appreciative of education, stating that she would not have thought about many of the back precautions by herself. Patient would benefit from continued physical therapy to improve mobility, posture during gait, and balance. Will continue to follow to maximize mobility.     Follow Up Recommendations Outpatient PT when Physician clears patient for follow-up treatment.    Equipment Recommendations  None recommended by PT    Recommendations for Other Services OT consult     Precautions / Restrictions Precautions Precautions: Back Precaution Booklet Issued: Yes (comment) Required Braces or Orthoses: Spinal Brace Spinal Brace: Applied in sitting position;Lumbar corset Restrictions Weight Bearing Restrictions: No      Mobility  Bed Mobility Overal bed mobility: Needs Assistance Bed Mobility: Rolling;Sidelying to Sit Rolling: Supervision Sidelying to sit: Min assist       General bed mobility comments: patient required verbal and tactile cuing for appropriate sequencing of trunk and LE to safely come to edge of bed.  Transfers Overall transfer level: Needs assistance Equipment used: Rolling walker (2 wheeled) Transfers: Sit to/from Stand Sit to Stand: Min guard            Ambulation/Gait Ambulation/Gait  assistance: Mod assist Ambulation Distance (Feet): 300 Feet Assistive device: Rolling walker (2 wheeled) Gait Pattern/deviations: Step-through pattern;Decreased step length - right;Decreased step length - left Gait velocity: decreased Gait velocity interpretation: Below normal speed for age/gender General Gait Details: Patient required intermittant verbal and tactile cuing to keep shoulders relaxed and maintain neutral extension during gait. Patient had tendency to maintain a forward head posture , thoracic and kyphosis, and maintain gaze on the ground.  Stairs            Wheelchair Mobility    Modified Rankin (Stroke Patients Only)       Balance Overall balance assessment: Needs assistance Sitting-balance support: Single extremity supported Sitting balance-Leahy Scale: Fair Sitting balance - Comments: patient able to maintain balance when donning back brace but ocassionally required single UE support for added balance.   Standing balance support: Bilateral upper extremity supported Standing balance-Leahy Scale: Poor Standing balance comment: Patient required use of walker and required B UE on walker for added support. Patient pushed up through B UE to achieve neutral alignment during amb.                             Pertinent Vitals/Pain Pain Assessment: 0-10 Pain Score: 4  Faces Pain Scale: Hurts whole lot Pain Location: back  Pain Descriptors / Indicators: Discomfort;Aching Pain Intervention(s): Limited activity within patient's tolerance    Home Living Family/patient expects to be discharged to:: Private residence Living Arrangements: Spouse/significant other Available Help at Discharge: Family;Available 24 hours/day;Available PRN/intermittently (Patient's daughter is available PRN. Patient's husband is retired and Engineer, mining.) Type of Home: House Home Access: Stairs to enter Entrance Stairs-Rails: Can reach both Entrance Stairs-Number of Steps:  [place]: One level Home Equipment:  Walker - 2 wheels;Bedside commode;Hand held shower head Additional Comments: Patient uses bedside commode as shower chair.    Prior Function Level of Independence: Needs assistance (Husband has been helping with washing back. Husband has also been cooking and and doing many ADL's for the last 10 weeks as patient recovered from cervical procedure.)               Hand Dominance   Dominant Hand: Right    Extremity/Trunk Assessment   Upper Extremity Assessment Upper Extremity Assessment: Overall WFL for tasks assessed    Lower Extremity Assessment Lower Extremity Assessment: Defer to PT evaluation    Cervical / Trunk Assessment Cervical / Trunk Assessment: Kyphotic (patient had cervical procedure performed several months ago.)  Communication   Communication: No difficulties  Cognition Arousal/Alertness: Awake/alert Behavior During Therapy: WFL for tasks assessed/performed Overall Cognitive Status: Impaired/Different from baseline Area of Impairment: Memory;Attention                   Current Attention Level: Selective Memory: Decreased short-term memory;Decreased recall of precautions         General Comments: Most likely related to medicaiton       General Comments General comments (skin integrity, edema, etc.): Drainage from incision site noted, nurse was notified.    Exercises     Assessment/Plan    PT Assessment Patient needs continued PT services  PT Problem List Decreased activity tolerance;Decreased balance;Decreased mobility;Decreased coordination;Decreased knowledge of precautions;Pain       PT Treatment Interventions Gait training;Stair training;Functional mobility training;Therapeutic activities;Therapeutic exercise;Balance training;Patient/family education    PT Goals (Current goals can be found in the Care Plan section)  Acute Rehab PT Goals Patient Stated Goal: Go back home and not have back  pain. PT Goal Formulation: With patient Time For Goal Achievement: 12/06/16    Frequency Min 3X/week   Barriers to discharge        Co-evaluation               AM-PAC PT "6 Clicks" Daily Activity  Outcome Measure Difficulty turning over in bed (including adjusting bedclothes, sheets and blankets)?: None Difficulty moving from lying on back to sitting on the side of the bed? : A Little Difficulty sitting down on and standing up from a chair with arms (e.g., wheelchair, bedside commode, etc,.)?: A Little Help needed moving to and from a bed to chair (including a wheelchair)?: A Little Help needed walking in hospital room?: A Little Help needed climbing 3-5 steps with a railing? : A Lot 6 Click Score: 18    End of Session Equipment Utilized During Treatment: Gait belt;Back brace Activity Tolerance: Patient limited by fatigue;Patient limited by pain Patient left: Other (comment) (Patient was left with OT) Nurse Communication: Mobility status PT Visit Diagnosis: Unsteadiness on feet (R26.81);Other abnormalities of gait and mobility (R26.89);Difficulty in walking, not elsewhere classified (R26.2)    Time: 4401-0272 PT Time Calculation (min) (ACUTE ONLY): 41 min   Charges:   PT Evaluation $PT Eval Moderate Complexity: 1 Mod PT Treatments $Gait Training: 8-22 mins $Therapeutic Activity: 8-22 mins   PT G Codes:        Kyndal Heringer SPT  Shantea Poulton 11/22/2016, 10:01 AM

## 2016-11-22 NOTE — Care Management Note (Signed)
Case Management Note  Patient Details  Name: Christina Booker MRN: [id number] Date of Birth: Dec 01, 1940  Subjective/Objective:                 Patient admitted from home w spouse for Spinal stenosis at L3-4 with intraspinal extradural facet cyst and multifactorial spinal stenosis with neurogenic claudication and previous instrumented fusion at L4-5 which is a solid fusion.    Action/Plan:  CM will follow for DC planning, please call if needs arise.  Expected Discharge Date:   (Pending)               Expected Discharge Plan:     In-House Referral:     Discharge planning Services  CM Consult  Post Acute Care Choice:    Choice offered to:     DME Arranged:    DME Agency:     HH Arranged:    HH Agency:     Status of Service:  In process, will continue to follow  If discussed at Long Length of Stay Meetings, dates discussed:    Additional Comments:  Carles Collet, RN 11/22/2016, 8:34 AM

## 2016-11-22 NOTE — Anesthesia Postprocedure Evaluation (Signed)
Anesthesia Post Note  Patient: Christina Booker  Procedure(s) Performed: Procedure(s) (LRB): Right L3-4 Transforaminal Lumbar Interbody Fusion, Removal Intraspinal Extradural Cyst, Pedicle Instrumentation and exchange, Bilateral Lateral Fusion (N/A)     Patient location during evaluation: PACU Anesthesia Type: General Level of consciousness: awake and alert Pain management: pain level controlled Vital Signs Assessment: post-procedure vital signs reviewed and stable Respiratory status: spontaneous breathing, nonlabored ventilation, respiratory function stable and patient connected to nasal cannula oxygen Cardiovascular status: blood pressure returned to baseline and stable Postop Assessment: no signs of nausea or vomiting Anesthetic complications: no    Last Vitals:  Vitals:   11/21/16 2305 11/22/16 0638  BP: 111/63 (!) 88/56  Pulse: 72 64  Resp: 16 16  Temp: 36.6 C 36.8 C  SpO2: 97% 95%    Last Pain:  Vitals:   11/22/16 0638  TempSrc: Oral  PainSc:                  Dennys Guin

## 2016-11-23 MED ORDER — TRAMADOL HCL 50 MG PO TABS: 50.0000 mg | ORAL_TABLET | Freq: Four times a day (QID) | ORAL | 0 refills | Status: DC | PRN

## 2016-11-23 MED FILL — Heparin Sodium (Porcine) Inj 1000 Unit/ML: INTRAMUSCULAR | Qty: 30 | Status: AC

## 2016-11-23 MED FILL — Sodium Chloride IV Soln 0.9%: INTRAVENOUS | Qty: 1000 | Status: AC

## 2016-11-23 MED FILL — Thrombin For Soln 20000 Unit: CUTANEOUS | Qty: 1 | Status: AC

## 2016-11-23 NOTE — Progress Notes (Signed)
   Subjective: 2 Days Post-Op Procedure(s) (LRB): Right L3-4 Transforaminal Lumbar Interbody Fusion, Removal Intraspinal Extradural Cyst, Pedicle Instrumentation and exchange, Bilateral Lateral Fusion (N/A) Patient reports pain as 3 on 0-10 scale.    Objective: Vital signs in last 24 hours: Temp:  [97.8 F (36.6 C)-98.5 F (36.9 C)] 98.2 F (36.8 C) (09/12 1230) Pulse Rate:  [59-71] 68 (09/12 1230) Resp:  [17-20] 18 (09/12 1230) BP: (88-107)/(52-65) 107/65 (09/12 1230) SpO2:  [93 %-98 %] 97 % (09/12 1230)  Intake/Output from previous day: 09/11 0701 - 09/12 0700 In: 363 [P.O.:360; I.V.:3] Out: -  Intake/Output this shift: Total I/O In: 240 [P.O.:240] Out: -    Recent Labs  11/22/16 0211  HGB 11.5*    Recent Labs  11/22/16 0211  WBC 9.9  RBC 4.06  HCT 35.9*  PLT 164    Recent Labs  11/22/16 0211  NA 137  K 4.2  CL 108  CO2 25  BUN 7  CREATININE 0.87  GLUCOSE 170*  CALCIUM 8.4*   No results for input(s): LABPT, INR in the last 72 hours.  Neurologically intact No results found.  Assessment/Plan: 2 Days Post-Op Procedure(s) (LRB): Right L3-4 Transforaminal Lumbar Interbody Fusion, Removal Intraspinal Extradural Cyst, Pedicle Instrumentation and exchange, Bilateral Lateral Fusion (N/A) Plan: discharge today office one week.   Marybelle Killings 11/23/2016, 2:42 PM

## 2016-11-23 NOTE — Progress Notes (Signed)
  Patient alert and oriented, mae's well, voiding adequate amount of urine, swallowing without difficulty,  c/o mild pain at time of discharge. Patient discharged home with family. Script and discharged instructions given to patient. Patient and family stated understanding of instructions given. Patient has an appointment with Dr. Yates   

## 2016-11-23 NOTE — Progress Notes (Signed)
Occupational Therapy Treatment Patient Details Name: Christina Booker MRN: [id number] DOB: 08-Apr-1940 Today's Date: 11/23/2016    History of present illness Patient presents s/p Right L3-4 Transforaminal Lumbar Interbody Fusion, Removal Intraspinal Extradural Cyst, Pedicle Instrumentation and exchange, Bilateral Lateral Fusion.    OT comments  Completed all education regarding back precautions with use of compensatory techniques, AE and DME for ADL and functional mobility. Pt verbalized understanding. Pt safe to DC home with assistance from husband when medically stable. No OT follow up needed.   Follow Up Recommendations  No OT follow up;Supervision/Assistance - 24 hour    Equipment Recommendations  None recommended by OT    Recommendations for Other Services      Precautions / Restrictions Precautions Precautions: Back Required Braces or Orthoses: Spinal Brace Spinal Brace: Applied in sitting position;Lumbar corset Restrictions Weight Bearing Restrictions: No       Mobility Bed Mobility               General bed mobility comments: Pt OOB in chair. Pt ableto verbalize correct technique for bed mobility.  Transfers Overall transfer level: Needs assistance   Transfers: Sit to/from Stand;Stand Pivot Transfers Sit to Stand: Supervision              Balance Overall balance assessment: Needs assistance           Standing balance-Leahy Scale: Fair Standing balance comment: able to release RW for self care                           ADL either performed or assessed with clinical judgement   ADL                                       Functional mobility during ADLs: Supervision/safety;Rolling walker General ADL Comments: Completed education regarding compensatory techqnieus for ADL and use of AE as needed . Pt states her husband will assist as needed. discussed hygiene after toielting with use of AE. Pt verbalized understanding. Pt  ablet to donn/doff brace with set up. Educated on home set up and reducing risk of falls. Pt verbalized understanding..      Vision       Perception     Praxis      Cognition Arousal/Alertness: Awake/alert Behavior During Therapy: WFL for tasks assessed/performed Overall Cognitive Status: Within Functional Limits for tasks assessed                                          Exercises     Shoulder Instructions       General Comments      Pertinent Vitals/ Pain       Pain Assessment: 0-10 Pain Score: 4  Pain Location: back  Pain Descriptors / Indicators: Grimacing;Guarding;Discomfort Pain Intervention(s): Limited activity within patient's tolerance  Home Living                                          Prior Functioning/Environment              Frequency           Progress Toward Goals  OT Goals(current goals can  now be found in the care plan section)  Progress towards OT goals: Goals met/education completed, patient discharged from OT  Acute Rehab OT Goals Patient Stated Goal: Go back home and not have back pain. OT Goal Formulation: All assessment and education complete, DC therapy ADL Goals Pt Will Perform Lower Body Dressing: with min assist;with caregiver independent in assisting;sit to/from stand Pt Will Transfer to Toilet: with supervision;ambulating;bedside commode Pt Will Perform Toileting - Clothing Manipulation and hygiene: with supervision;with set-up;with adaptive equipment;with caregiver independent in assisting;sitting/lateral leans;sit to/from stand Additional ADL Goal #1: Pt will independently states 3/3/ back precautions for ADL  Plan All goals met and education completed, patient discharged from OT services    Co-evaluation                 AM-PAC PT "6 Clicks" Daily Activity     Outcome Measure   Help from another person eating meals?: None Help from another person taking care of personal  grooming?: None Help from another person toileting, which includes using toliet, bedpan, or urinal?: A Little Help from another person bathing (including washing, rinsing, drying)?: A Little Help from another person to put on and taking off regular upper body clothing?: None Help from another person to put on and taking off regular lower body clothing?: A Little 6 Click Score: 21    End of Session Equipment Utilized During Treatment: Back brace  OT Visit Diagnosis: Unsteadiness on feet (R26.81);Muscle weakness (generalized) (M62.81);Pain Pain - part of body:  (back)   Activity Tolerance Patient tolerated treatment well   Patient Left in chair;with call bell/phone within reach   Nurse Communication Other (comment) (ready for DC)        Time: 0840-0900 OT Time Calculation (min): 20 min  Charges: OT General Charges $OT Visit: 1 Visit OT Treatments $Self Care/Home Management : 8-22 mins  [facility], OT/L  [phone] 11/23/2016   Christina Booker,Christina Booker 11/23/2016, 9:20 AM

## 2016-11-23 NOTE — Progress Notes (Signed)
Physical Therapy Treatment Patient Details Name: Christina Booker MRN: [id number] DOB: 03/28/1940 Today's Date: 11/23/2016    History of Present Illness Patient presents s/p Right L3-4 Transforaminal Lumbar Interbody Fusion, Removal Intraspinal Extradural Cyst, Pedicle Instrumentation and exchange, Bilateral Lateral Fusion.     PT Comments    Pt progressing towards physical therapy goals. Pt with continued difficulty maintaining upright posture with relaxed shoulders, and was given a youth walker with immediate improvement in posture. Recommend youth RW at d/c. Will continue to follow and progress as able per POC.   Follow Up Recommendations  Outpatient PT     Equipment Recommendations  Youth rolling walker   Recommendations for Other Services OT consult     Precautions / Restrictions Precautions Precautions: Back Precaution Booklet Issued: Yes (comment) Required Braces or Orthoses: Spinal Brace Spinal Brace: Applied in sitting position;Lumbar corset Restrictions Weight Bearing Restrictions: No    Mobility  Bed Mobility               General bed mobility comments: Pt seated in chair upon PT arrival.  Transfers Overall transfer level: Needs assistance Equipment used: Rolling walker (2 wheeled) Transfers: Sit to/from Stand Sit to Stand: Min guard         General transfer comment: Patient require min guard for safety when coming from sit to stand. Once patient was in standing, patient required hand hold assist of B UE and then used bed foot board to maneuver tight space in room and get to walker.  Ambulation/Gait Ambulation/Gait assistance: Min assist Ambulation Distance (Feet): 350 Feet Assistive device: Rolling walker (2 wheeled) Gait Pattern/deviations: Decreased step length - right;Decreased step length - left;Step-through pattern;Decreased stride length Gait velocity: decreased Gait velocity interpretation: Below normal speed for age/gender General Gait  Details: Patient was verbally cued to relax shoulders and maintain neutral trunk extension. Patient was also verbally cued to maintain walker at closer proximity and keep gaze straight ahead due to patients tendency to maintain poor posture during ambulation.   Stairs Stairs: Yes   Stair Management: One rail Right;One rail Left Number of Stairs: 6 General stair comments: Patient cued to negotiate stairs ascending and descending sideways with both hands on rail. Patient cued to continue to maintain back precautions throughout. Patient expressed increased ease of negotiating stairs with aformentioned method.  Wheelchair Mobility    Modified Rankin (Stroke Patients Only)       Balance Overall balance assessment: Needs assistance Sitting-balance support: Single extremity supported Sitting balance-Leahy Scale: Fair     Standing balance support: Bilateral upper extremity supported Standing balance-Leahy Scale: Fair Standing balance comment: able to release RW for self care                            Cognition Arousal/Alertness: Awake/alert Behavior During Therapy: WFL for tasks assessed/performed Overall Cognitive Status: Within Functional Limits for tasks assessed                                        Exercises      General Comments        Pertinent Vitals/Pain Pain Assessment: 0-10 Pain Score: 4  Pain Location: back  Pain Descriptors / Indicators: Grimacing;Guarding;Discomfort Pain Intervention(s): Limited activity within patient's tolerance;Monitored during session;Repositioned    Home Living  Prior Function            PT Goals (current goals can now be found in the care plan section) Acute Rehab PT Goals Patient Stated Goal: Go back home and not have back pain. PT Goal Formulation: With patient Time For Goal Achievement: 12/06/16 Progress towards PT goals: Progressing toward goals    Frequency     Min 5X/week      PT Plan Current plan remains appropriate    Co-evaluation              AM-PAC PT "6 Clicks" Daily Activity  Outcome Measure  Difficulty turning over in bed (including adjusting bedclothes, sheets and blankets)?: None Difficulty moving from lying on back to sitting on the side of the bed? : A Little Difficulty sitting down on and standing up from a chair with arms (e.g., wheelchair, bedside commode, etc,.)?: A Little Help needed moving to and from a bed to chair (including a wheelchair)?: A Little Help needed walking in hospital room?: A Little Help needed climbing 3-5 steps with a railing? : A Little 6 Click Score: 19    End of Session Equipment Utilized During Treatment: Gait belt;Back brace Activity Tolerance: Patient limited by fatigue;Patient limited by pain Patient left: in chair;with call bell/phone within reach Nurse Communication: Mobility status PT Visit Diagnosis: Unsteadiness on feet (R26.81);Other abnormalities of gait and mobility (R26.89);Difficulty in walking, not elsewhere classified (R26.2)     Time: 1027-2536 PT Time Calculation (min) (ACUTE ONLY): 23 min  Charges:  $Gait Training: 23-37 mins                    G Codes:       Rolinda Roan, PT, DPT Acute Rehabilitation Services Pager: [phone]    Thelma Comp 11/23/2016, 11:31 AM

## 2016-11-24 ENCOUNTER — Other Ambulatory Visit (INDEPENDENT_AMBULATORY_CARE_PROVIDER_SITE_OTHER): Payer: Self-pay | Admitting: Orthopaedic Surgery

## 2016-11-24 NOTE — Telephone Encounter (Signed)
Ok for refill? 

## 2016-11-24 NOTE — Telephone Encounter (Signed)
oK THANKS 

## 2016-11-25 DIAGNOSIS — Z7982 Long term (current) use of aspirin: Secondary | ICD-10-CM | POA: Diagnosis not present

## 2016-11-25 DIAGNOSIS — S199XXA Unspecified injury of neck, initial encounter: Secondary | ICD-10-CM | POA: Diagnosis not present

## 2016-11-25 DIAGNOSIS — S0990XA Unspecified injury of head, initial encounter: Secondary | ICD-10-CM | POA: Diagnosis not present

## 2016-11-25 DIAGNOSIS — I480 Paroxysmal atrial fibrillation: Secondary | ICD-10-CM | POA: Diagnosis not present

## 2016-11-25 DIAGNOSIS — M5489 Other dorsalgia: Secondary | ICD-10-CM | POA: Diagnosis not present

## 2016-11-25 DIAGNOSIS — Z87891 Personal history of nicotine dependence: Secondary | ICD-10-CM | POA: Diagnosis not present

## 2016-11-25 DIAGNOSIS — T148XXA Other injury of unspecified body region, initial encounter: Secondary | ICD-10-CM | POA: Diagnosis not present

## 2016-11-25 DIAGNOSIS — S299XXA Unspecified injury of thorax, initial encounter: Secondary | ICD-10-CM | POA: Diagnosis not present

## 2016-11-25 DIAGNOSIS — Z8673 Personal history of transient ischemic attack (TIA), and cerebral infarction without residual deficits: Secondary | ICD-10-CM | POA: Diagnosis not present

## 2016-11-25 DIAGNOSIS — E785 Hyperlipidemia, unspecified: Secondary | ICD-10-CM | POA: Diagnosis not present

## 2016-11-25 DIAGNOSIS — S3992XA Unspecified injury of lower back, initial encounter: Secondary | ICD-10-CM | POA: Diagnosis not present

## 2016-11-25 DIAGNOSIS — R7989 Other specified abnormal findings of blood chemistry: Secondary | ICD-10-CM | POA: Diagnosis not present

## 2016-11-25 DIAGNOSIS — S8001XA Contusion of right knee, initial encounter: Secondary | ICD-10-CM | POA: Diagnosis not present

## 2016-11-25 DIAGNOSIS — I110 Hypertensive heart disease with heart failure: Secondary | ICD-10-CM | POA: Diagnosis not present

## 2016-11-25 DIAGNOSIS — I10 Essential (primary) hypertension: Secondary | ICD-10-CM | POA: Diagnosis not present

## 2016-11-25 DIAGNOSIS — I509 Heart failure, unspecified: Secondary | ICD-10-CM | POA: Diagnosis not present

## 2016-11-25 DIAGNOSIS — I4891 Unspecified atrial fibrillation: Secondary | ICD-10-CM | POA: Diagnosis not present

## 2016-11-25 DIAGNOSIS — F418 Other specified anxiety disorders: Secondary | ICD-10-CM | POA: Diagnosis not present

## 2016-11-25 DIAGNOSIS — S0003XA Contusion of scalp, initial encounter: Secondary | ICD-10-CM | POA: Diagnosis not present

## 2016-11-25 DIAGNOSIS — G9340 Encephalopathy, unspecified: Secondary | ICD-10-CM | POA: Diagnosis not present

## 2016-11-25 DIAGNOSIS — I5033 Acute on chronic diastolic (congestive) heart failure: Secondary | ICD-10-CM | POA: Diagnosis not present

## 2016-11-25 DIAGNOSIS — Z79899 Other long term (current) drug therapy: Secondary | ICD-10-CM | POA: Diagnosis not present

## 2016-11-25 DIAGNOSIS — S8991XA Unspecified injury of right lower leg, initial encounter: Secondary | ICD-10-CM | POA: Diagnosis not present

## 2016-11-25 DIAGNOSIS — Z880 Allergy status to penicillin: Secondary | ICD-10-CM | POA: Diagnosis not present

## 2016-11-26 DIAGNOSIS — I4891 Unspecified atrial fibrillation: Secondary | ICD-10-CM | POA: Diagnosis not present

## 2016-11-29 DIAGNOSIS — I4891 Unspecified atrial fibrillation: Secondary | ICD-10-CM | POA: Diagnosis not present

## 2016-11-29 DIAGNOSIS — R748 Abnormal levels of other serum enzymes: Secondary | ICD-10-CM | POA: Diagnosis not present

## 2016-11-29 DIAGNOSIS — Z7982 Long term (current) use of aspirin: Secondary | ICD-10-CM | POA: Diagnosis not present

## 2016-11-29 DIAGNOSIS — I110 Hypertensive heart disease with heart failure: Secondary | ICD-10-CM | POA: Diagnosis not present

## 2016-11-29 DIAGNOSIS — Z9181 History of falling: Secondary | ICD-10-CM | POA: Diagnosis not present

## 2016-11-29 DIAGNOSIS — I5031 Acute diastolic (congestive) heart failure: Secondary | ICD-10-CM | POA: Diagnosis not present

## 2016-11-29 DIAGNOSIS — F329 Major depressive disorder, single episode, unspecified: Secondary | ICD-10-CM | POA: Diagnosis not present

## 2016-11-29 DIAGNOSIS — I480 Paroxysmal atrial fibrillation: Secondary | ICD-10-CM | POA: Diagnosis not present

## 2016-11-29 DIAGNOSIS — Z4789 Encounter for other orthopedic aftercare: Secondary | ICD-10-CM | POA: Diagnosis not present

## 2016-11-29 DIAGNOSIS — Z8673 Personal history of transient ischemic attack (TIA), and cerebral infarction without residual deficits: Secondary | ICD-10-CM | POA: Diagnosis not present

## 2016-11-29 DIAGNOSIS — F419 Anxiety disorder, unspecified: Secondary | ICD-10-CM | POA: Diagnosis not present

## 2016-11-30 ENCOUNTER — Other Ambulatory Visit: Payer: Self-pay

## 2016-11-30 NOTE — Patient Outreach (Addendum)
[facility]) Care Management  11/30/2016  Christina Booker 1941-02-05 [id number]  Transition of care  Week # 1 Referral date: 11/30/16 Referral source: Insurance referral for transition of care Program: Transition of care / Heart failure Insurance: Health team advantage Providers: Dr. Gilford Rile,  Dr. Lorin Mercy Social support:  Live with spouse, Arva Chafe.  Spouse assist patient with her care. HIPAA: Patient gave verbal authorization to speak with her husband, Christina Booker regarding all of her health information.    SUBJECTIVE: Telephone call to patient. HIPAA verified. Discussed Transition of care program with patient and spouse. Patient verbally agreed to transition program.  Patient states she was discharged on 11/23/16 after having back (fusion 3-4 surgery).  Patient states she was readmitted to [facility] due to a fall, atrial fibrillation/ congestive heart failure being discharged on 11/27/16.  Patient reports the atrial fibrillation and congestive heart failure are new diagnosis. Patient denies any injury from fall. Patient states she had a follow up with her primary MD on yesterday 11/29/16 and has another scheduled for 2 months. Patient states she has a follow up appointment with her surgeon scheduled within 1 week. Patient states she currently has [facility] providing services for nursing and therapy. Patient states home health is monitoring her incision site and changing dressing.  RNCM discussed signs/ symptoms of infection with patient. Advised to call doctor for symptoms. Patient verbalized understanding.  Patient reports she has all of her medications and takes as prescribed.  Patient states she has good support from her husband/ RNCM discussed congestive heart failure and atrial fibrillation with patient. Advised patient to start weighing every day and recording weights. Advised patient to have a low salt diet. Advised patient to report a  3 lbs overnight weight gain or 5 lbs in a week to her doctor. Advised patient to keep follow up appointments and take her medications as prescribed. Patient verbally agreed to received EMMI education material regarding heart failure, low salt diet, and atrial fibrillation. . Patient verbally agreed to next telephone outreach with [facility]. RNCM advised patient to notify MD of any changes in condition prior to scheduled appointment. RNCM provided contact name and number: [phone] or main office number [phone] and 24 hour nurse advise line [phone].  RNCM verified patient aware of 911 services for urgent/ emergent needs..   ASSESSMENT;  Status post back surgery 11/21/16 with discharge from hospital on 11/23/16.  Readmission to hospital due to fall, heart failure/ atrial fibrillation with discharge on 11/27/16.  Patient will benefit from transition of care follow up.   PLAN:  RNCM will follow up with patient within 1 week.  RNCM will send patient [facility] care management welcome packet, consent form, and welcome letter. RNCM will send patient's primary MD involvement letter.  RNCM will send patient EMMI education material on heart failure, low salt diet and atrial fibrillation.   Quinn Plowman RN,BSN,CCM [facility] Telephonic  [phone]

## 2016-12-06 ENCOUNTER — Telehealth (INDEPENDENT_AMBULATORY_CARE_PROVIDER_SITE_OTHER): Payer: Self-pay | Admitting: Radiology

## 2016-12-06 ENCOUNTER — Ambulatory Visit (INDEPENDENT_AMBULATORY_CARE_PROVIDER_SITE_OTHER): Payer: PPO | Admitting: Orthopaedic Surgery

## 2016-12-06 ENCOUNTER — Encounter (INDEPENDENT_AMBULATORY_CARE_PROVIDER_SITE_OTHER): Payer: Self-pay | Admitting: Orthopaedic Surgery

## 2016-12-06 ENCOUNTER — Ambulatory Visit (INDEPENDENT_AMBULATORY_CARE_PROVIDER_SITE_OTHER): Payer: PPO

## 2016-12-06 DIAGNOSIS — Z981 Arthrodesis status: Secondary | ICD-10-CM

## 2016-12-06 NOTE — Discharge Summary (Signed)
Patient ID: Christina Booker MRN: [id number] DOB/AGE: [birth year] 76 y.o.  Admit date: 11/21/2016 Discharge date: 12/06/2016  Admission Diagnoses:  Active Problems:   Lumbar stenosis   Discharge Diagnoses:  Active Problems:   Lumbar stenosis  status post Procedure(s): Right L3-4 Transforaminal Lumbar Interbody Fusion, Removal Intraspinal Extradural Cyst, Pedicle Instrumentation and exchange, Bilateral Lateral Fusion  Past Medical History:  Diagnosis Date  . Arthritis    hands, back  . Cervical spinal stenosis   . Complication of anesthesia   . Constipation   . Headache   . Hot flashes   . Hyperlipidemia   . Hypertension   . Irritable bowel syndrome   . PONV (postoperative nausea and vomiting)   . Rosacea   . Stroke [facility]) 12/01/2009   tia    Surgeries: Procedure(s): Right L3-4 Transforaminal Lumbar Interbody Fusion, Removal Intraspinal Extradural Cyst, Pedicle Instrumentation and exchange, Bilateral Lateral Fusion on 11/21/2016   Consultants:   Discharged Condition: Improved  Hospital Course: Christina Booker is an 76 y.o. female who was admitted 11/21/2016 for operative treatment of lumbar stenosis. Patient failed conservative treatments (please see the history and physical for the specifics) and had severe unremitting pain that affects sleep, daily activities and work/hobbies. After pre-op clearance, the patient was taken to the operating room on 11/21/2016 and underwent  Procedure(s): Right L3-4 Transforaminal Lumbar Interbody Fusion, Removal Intraspinal Extradural Cyst, Pedicle Instrumentation and exchange, Bilateral Lateral Fusion.    Patient was given perioperative antibiotics:  Anti-infectives    Start     Dose/Rate Route Frequency Ordered Stop   11/21/16 2300  vancomycin (VANCOCIN) IVPB 1000 mg/200 mL premix     1,000 mg 200 mL/hr over 60 Minutes Intravenous  Once 11/21/16 1843 11/21/16 2321   11/21/16 1000  vancomycin (VANCOCIN) IVPB 1000 mg/200 mL premix      1,000 mg 200 mL/hr over 60 Minutes Intravenous On call to O.R. 11/20/16 1104 11/21/16 1332       Patient was given sequential compression devices and early ambulation to prevent DVT.   Patient benefited maximally from hospital stay and there were no complications. At the time of discharge, the patient was urinating/moving their bowels without difficulty, tolerating a regular diet, pain is controlled with oral pain medications and they have been cleared by PT/OT.   Recent vital signs: No data found.    Recent laboratory studies: No results for input(s): WBC, HGB, HCT, PLT, NA, K, CL, CO2, BUN, CREATININE, GLUCOSE, INR, CALCIUM in the last 72 hours.  Invalid input(s): PT, 2   Discharge Medications:   Allergies as of 11/23/2016      Reactions   Penicillins Rash   Has patient had a PCN reaction causing immediate rash, facial/tongue/throat swelling, SOB or lightheadedness with hypotension: Yes Has patient had a PCN reaction causing severe rash involving mucus membranes or skin necrosis: No Has patient had a PCN reaction that required hospitalization: No Has patient had a PCN reaction occurring within the last 10 years: No If all of the above answers are "NO", then may proceed with Cephalosporin use.   Methocarbamol Nausea Only      Medication List    TAKE these medications   acetaminophen 325 MG tablet Commonly known as:  TYLENOL Take 650 mg by mouth every 6 (six) hours as needed (for pain.).   aspirin EC 81 MG tablet Take 81 mg by mouth at bedtime.   atorvastatin 20 MG tablet Commonly known as:  LIPITOR Take 20 mg by  mouth at bedtime.   BENEFIBER PO Take 7.5-15 mLs by mouth every evening.   buPROPion 150 MG 24 hr tablet Commonly known as:  WELLBUTRIN XL Take 150 mg by mouth every morning.   dicyclomine 10 MG capsule Commonly known as:  BENTYL Take 10 mg by mouth 2 (two) times daily as needed for spasms.   FRESHKOTE OP Place 2 drops into both eyes daily as needed  (dry eyes).   glycerin adult 2 g Supp Place 1 suppository rectally 3 (three) times daily as needed (constipation).   HYDROcodone-acetaminophen 5-325 MG tablet Commonly known as:  NORCO/VICODIN Take 1-2 tablets by mouth every 4 (four) hours as needed. What changed:  when to take this  reasons to take this   lisinopril 5 MG tablet Commonly known as:  PRINIVIL,ZESTRIL Take 5 mg by mouth at bedtime.   pantoprazole 40 MG tablet Commonly known as:  PROTONIX Take 40 mg by mouth daily before breakfast.   polyethylene glycol powder powder Commonly known as:  GLYCOLAX/MIRALAX Take 17 g by mouth daily as needed (for constipation.).   PROBIOTIC PO Take 1 capsule by mouth daily.   traMADol 50 MG tablet Commonly known as:  ULTRAM Take 1 tablet (50 mg total) by mouth every 6 (six) hours as needed. What changed:  how much to take  how to take this  when to take this  reasons to take this  additional instructions   Turmeric Curcumin 500 MG Caps Take 1,000 mg by mouth 2 (two) times daily.   Vitamin D3 1000 units Caps Take 1,000 Units by mouth 2 (two) times daily.   zolpidem 10 MG tablet Commonly known as:  AMBIEN Take 10 mg by mouth at bedtime as needed for sleep.            Discharge Care Instructions        Start     Ordered   11/23/16 0000  traMADol (ULTRAM) 50 MG tablet  Every 6 hours PRN     11/23/16 1447      Diagnostic Studies: Dg Lumbar Spine 2-3 Views  Result Date: 11/21/2016 CLINICAL DATA:  L3-4 TLIF EXAM: LUMBAR SPINE - 2-3 VIEW; DG C-ARM 61-120 MIN COMPARISON:  04/27/2016 lumbar spine radiographs FINDINGS: New interbody block at L3-4 with posterior spinal fusion hardware is noted. Pre-existing L4-5 posterior spinal fusion hardware with interbody block is again seen without significant change. IMPRESSION: Lumbar spinal fusion hardware now spans L3 through L5 status post TLIF at L3-4. No immediate complications identified. Electronically Signed   By:  Ashley Royalty M.D.   On: 11/21/2016 20:15   Dg C-arm 61-120 Min  Result Date: 11/21/2016 CLINICAL DATA:  L3-4 TLIF EXAM: LUMBAR SPINE - 2-3 VIEW; DG C-ARM 61-120 MIN COMPARISON:  04/27/2016 lumbar spine radiographs FINDINGS: New interbody block at L3-4 with posterior spinal fusion hardware is noted. Pre-existing L4-5 posterior spinal fusion hardware with interbody block is again seen without significant change. IMPRESSION: Lumbar spinal fusion hardware now spans L3 through L5 status post TLIF at L3-4. No immediate complications identified. Electronically Signed   By: Ashley Royalty M.D.   On: 11/21/2016 20:15        Discharge Plan:  discharge to home  Disposition:     Signed: Benjiman Core  12/06/2016, 11:18 AM

## 2016-12-06 NOTE — Progress Notes (Signed)
   Post-Op Visit Note   Patient: Christina Booker           Date of Birth: Feb 24, 1941           MRN: [id number] Visit Date: 12/06/2016 PCP: Raina Mina., MD   Assessment & Plan: Post lumbar fusion for adjacent level progression. Incision looks good. Previous cervical fusion at C7-T1 with good relief of numbness and headaches. She'll continue her brace and  recheck her again in 6 weeks. She is using one or 2 tramadol a day.  Chief Complaint:  Chief Complaint  Patient presents with  . Lower Back - Routine Post Op   Visit Diagnoses:  1. S/P lumbar fusion     Plan: Recheck 6 weeks AP lateral lumbar x-ray out of her brace on return.  Follow-Up Instructions: No Follow-up on file.   Orders:  Orders Placed This Encounter  Procedures  . XR Lumbar Spine 2-3 Views   No orders of the defined types were placed in this encounter.   Imaging: No results found.  PMFS History: Patient Active Problem List   Diagnosis Date Noted  . PONV (postoperative nausea and vomiting) 11/21/2016  . Lumbar stenosis 11/21/2016  . History of fusion of cervical spine 10/11/2016  . Cervical spinal stenosis 09/07/2016  . Spinal stenosis of lumbar region with neurogenic claudication 06/01/2016  . Cervical radiculopathy 05/12/2016  . Spinal stenosis of cervical region 05/12/2016  . Cervicalgia 05/12/2016  . Ganglion of left hand 12/22/2014  . S/P lumbar fusion 04/16/2014   Past Medical History:  Diagnosis Date  . Arthritis    hands, back  . Cervical spinal stenosis   . Complication of anesthesia   . Constipation   . Headache   . Hot flashes   . Hyperlipidemia   . Hypertension   . Irritable bowel syndrome   . PONV (postoperative nausea and vomiting)   . Rosacea   . Stroke [facility]) 12/01/2009   tia    Family History  Problem Relation Age of Onset  . Cancer Mother   . Uterine cancer Mother   . Hypertension Mother   . Heart attack Father     Past Surgical History:  Procedure Laterality  Date  . ABDOMINAL HYSTERECTOMY    . ANTERIOR CERVICAL DECOMP/DISCECTOMY FUSION N/A 09/07/2016   Procedure: C7-T1 Anterior Cervical Discectomy and Fusion, Allograft, Plate;  Surgeon: Marybelle Killings, MD;  Location: [facility];  Service: Orthopedics;  Laterality: N/A;  . BACK SURGERY    . CARPAL TUNNEL RELEASE Right 2009  . CARPAL TUNNEL RELEASE Left   . CATARACT EXTRACTION, BILATERAL    . CERVICAL DISCECTOMY  09/07/2016   C7-T1 Anterior Cervical Discectomy and Fusion, Allograft, Plate (N/A)  . EYE SURGERY    . GANGLION CYST EXCISION Left 12/22/2014   Procedure: Excision Left Hand Ganglion cyst;  Surgeon: Marybelle Killings, MD;  Location: [place];  Service: Orthopedics;  Laterality: Left;  . HEMORRHOID SURGERY    . TUBAL LIGATION     Social History   Occupational History  . Not on file.   Social History Main Topics  . Smoking status: Former Smoker    Quit date: 04/09/1992  . Smokeless tobacco: Never Used  . Alcohol use 2.4 oz/week    4 Glasses of wine per week     Comment: 4  . Drug use: No  . Sexual activity: Not on file

## 2016-12-06 NOTE — Telephone Encounter (Signed)
Stacy from [facility] called and states that this patients Discharge Summary needs to be completed ASAP, it is past due.  She was admitted on 11/21/16. Thanks

## 2016-12-07 ENCOUNTER — Ambulatory Visit: Payer: Self-pay

## 2016-12-13 ENCOUNTER — Other Ambulatory Visit: Payer: Self-pay

## 2016-12-13 NOTE — Patient Outreach (Addendum)
[facility]) Care Management  12/13/2016  Christina Booker November 21, 1940 [id number]  Transition of care  Week # 2 Referral date: 11/30/16 Referral source: Insurance referral for transition of care Program: Transition of care / Heart failure Insurance: Health team advantage Providers: Dr. Gilford Rile,  Dr. Lorin Mercy Social support:  Live with spouse, Christina Booker.  Spouse assist patient with her care. HIPAA: Patient gave verbal authorization to speak with her husband, Christina Booker regarding all of her health information.  Attempt #1  Telephone call to patient for transition of care follow up. Unable to reach patient. HIPAA compliant voice message left with call back phone number.   PLAN; RNCM will attempt 2nd telephone call to patient within 1 week.   Quinn Plowman RN,BSN,CCM [facility] Telephonic  [phone]

## 2016-12-16 ENCOUNTER — Other Ambulatory Visit: Payer: Self-pay

## 2016-12-16 NOTE — Patient Outreach (Signed)
[facility]) Care Management  [place]  12/16/2016   Christina Booker 1940/07/27 [id number]  Initial telephone assessment: Subjective: Telephone call to patient. HIPAA verified. Patient states she is doing well. States she continues to have some surgical pain. Patient reports her pain level is a 3-4.  Patient states she would like to get off of the tramadol she takes for pain. States she is taking them 3 times per day. Patient denies any signs of infection at her incision site. Patient states her incision is healing well. Patient reports she continues to ambulate with her walker. Patient states she uses a shower chair without a back and a toilet chair over her commode. Patient reports she has fallen within the past year. Patient states she thinks its just, "being clumsy." RNCM discussed fall prevention with patient.  Patient denies having any services at home. Patient states she has a follow up appointment with her primary MD in 2 months and will follow up with Dr. Lorin Mercy, her surgeon on 01/17/17.  RNCM advised patient to notify MD of any changes in condition prior to scheduled appointment. Patient verbally agreed to next follow up with RNCM.    Objective: see assesment  Encounter Medications:  Outpatient Encounter Prescriptions as of 12/16/2016  Medication Sig Note  . aspirin EC 81 MG tablet Take 81 mg by mouth at bedtime.    Marland Kitchen atorvastatin (LIPITOR) 20 MG tablet Take 20 mg by mouth at bedtime.    Marland Kitchen buPROPion (WELLBUTRIN XL) 150 MG 24 hr tablet Take 150 mg by mouth every morning.   . Cholecalciferol (VITAMIN D3) 1000 units CAPS Take 1,000 Units by mouth 2 (two) times daily.   . pantoprazole (PROTONIX) 40 MG tablet Take 40 mg by mouth daily before breakfast.   . polyethylene glycol powder (GLYCOLAX/MIRALAX) powder Take 17 g by mouth daily as needed (for constipation.).    Marland Kitchen Polyvinyl Alcohol-Povidone (FRESHKOTE OP) Place 2 drops into both eyes daily as needed (dry  eyes).   . traMADol (ULTRAM) 50 MG tablet Take 1 tablet (50 mg total) by mouth every 6 (six) hours as needed.   . Turmeric Curcumin 500 MG CAPS Take 1,000 mg by mouth 2 (two) times daily.    . Wheat Dextrin (BENEFIBER PO) Take 7.5-15 mLs by mouth every evening.  11/15/2016: 0.5-1 tablespoonful  . acetaminophen (TYLENOL) 325 MG tablet Take 650 mg by mouth every 6 (six) hours as needed (for pain.).    Marland Kitchen dicyclomine (BENTYL) 10 MG capsule Take 10 mg by mouth 2 (two) times daily as needed for spasms.   Marland Kitchen glycerin adult 2 G SUPP Place 1 suppository rectally 3 (three) times daily as needed (constipation).   Marland Kitchen HYDROcodone-acetaminophen (NORCO/VICODIN) 5-325 MG tablet Take 1-2 tablets by mouth every 4 (four) hours as needed. (Patient not taking: Reported on 12/06/2016)   . lisinopril (PRINIVIL,ZESTRIL) 5 MG tablet Take 5 mg by mouth at bedtime.  12/16/2016: Patient states her doctor discontinued  . Probiotic Product (PROBIOTIC PO) Take 1 capsule by mouth daily.   . promethazine (PHENERGAN) 12.5 MG tablet TAKE 1 TABLET BY MOUTH EVERY 6 HOURS AS NEEDED FOR NAUSEA OR  VOMITING (Patient not taking: Reported on 12/16/2016)   . zolpidem (AMBIEN) 10 MG tablet Take 10 mg by mouth at bedtime as needed for sleep.    No facility-administered encounter medications on file as of 12/16/2016.     Functional Status:  In your present state of health, do you have any difficulty performing  the following activities: 12/16/2016 11/21/2016  Hearing? N N  Vision? N N  Difficulty concentrating or making decisions? N N  Walking or climbing stairs? N Y  Dressing or bathing? N Y  Doing errands, shopping? [place]  Preparing Food and eating ? Y -  Using the Toilet? N -  In the past six months, have you accidently leaked urine? N -  Do you have problems with loss of bowel control? N -  Managing your Medications? N -  Managing your Finances? N -  Housekeeping or managing your Housekeeping? Y -  Some recent data might be hidden     Fall/Depression Screening: Fall Risk  12/16/2016  Falls in the past year? Yes  Number falls in past yr: 2 or more  Injury with Fall? No  Risk Factor Category  High Fall Risk  Risk for fall due to : History of fall(s)  Follow up Falls prevention discussed   PHQ 2/9 Scores 12/16/2016  PHQ - 2 Score 0   THN CM Care Plan Problem One     Most Recent Value  Care Plan Problem One  potential for readmission due to recent hospitalization  Role Documenting the Problem One  Care Management Telephonic [place] for Problem One  Active  [facility] Long Term Goal   patient will verbalize no hospital readmission within 31 days  THN Long Term Goal Start Date  12/20/16  Interventions for Problem One Long Term Goal  RNCM advised patient to call doctor for sign/symptoms of infection, advised patient to keep follow up doctor appointments.   THN CM Short Term Goal #1   Patient will report no symptoms of infection within 30 days.  THN CM Short Term Goal #1 Start Date  12/20/16  Interventions for Short Term Goal #1  RNCM reviewed signs of infection with patient.     THN CM Care Plan Problem Two     Most Recent Value  Care Plan Problem Two  fall risk  Role Documenting the Problem Two  Care Management Telephonic Coordinator  Care Plan for Problem Two  Active  THN CM Short Term Goal #1   Patient will report she has not had any falls within 30 days.   THN CM Short Term Goal #1 Start Date  12/20/16  Interventions for Short Term Goal #2   RNCM discussed fall prevention with patient.       Assessment: ongoing transition of care follow up   Plan: RNCM will follow up with patient within 1 week.  RNCM will send patients primary MD completed assessment RNCM will send patient EMMI fall prevention education information  Quinn Plowman RN,BSN,CCM [facility] Telephonic  [phone]

## 2016-12-23 ENCOUNTER — Other Ambulatory Visit: Payer: Self-pay

## 2016-12-23 NOTE — Patient Outreach (Addendum)
[facility]) Care Management  12/23/2016  Christina Booker 10-02-1940 [id number]  Transition of care  Week # 4 Referral date: 11/30/16 Referral source: Insurance referral for transition of care Program: Transition of care / Heart failure Insurance: Health team advantage Providers: Dr. Gilford Rile, Dr. Lorin Mercy Social support: Live with spouse, Christina Booker. Spouse assist patient with her care. HIPAA: Patient gave verbal authorization to speak with her husband, Christina Booker regarding all of her health information.    Telephone call to patient for transition of care follow up. HIPAA verified. Patient states she is doing ok. Patient states she is still having some pain in her back.  Reports pain level between 3 and 4 today.  Patient states she is taking a maximum of 3 tramadol per day. Patient states she is up and ambulating. Patient states she continues to use her walker for ambulation and wears her back brace.  Patient states she's been able to go outside which has been nice for her. Patient reports her incision site is doing well. Denies any signs/ symptoms of infection.  Patient states she will be seeing her surgeon for follow up on 01/17/17 and has a follow up to see her primary MD on 01/26/17. Patient states she is still having stomach discomfort. Patient states she has been dealing with this for approximately 1 year. Patient states she has been to her gastroenterologist a few times regarding this problem. Patient states she's had endoscopy in the past which shows her having "sores" in her stomach.  Patient states she was advised to change from her 325mg  aspirin to 81mg  which she has done. Patients states she continues to have the pain/ discomfort. Patient states she decided to stop the aspirin for 3 days to see if it makes her stomach feel any better. Patient states she was on aspirin due to having TIA's  In the past. RNCM advised patient that she she inform her  doctor she wants to discontinue the aspirin to confirm her doctor is in agreement. RNCM advised patient to call and/ or schedule follow up visit with her gastroenterologist.  Patient verbalized understanding.  RNCM discussed fall prevention with patient. RNCM inquired if patient had received her EMMI education material on fall prevention. Patient states she has not yet received.  Patient verbally agreed to next follow up with [facility] within the month of November 2018.   ASSESSMENT: ongoing telephone follow up with patient. Patient has been advised to keep upcoming scheduled appointments with her doctor. Advised to notify her doctor of her plan/ request to discontinue her aspirin.   PLAN;  RNCM will follow up with patient in the month of November 2018.   Quinn Plowman RN,BSN,CCM [facility] Telephonic  [phone]

## 2016-12-24 ENCOUNTER — Other Ambulatory Visit (INDEPENDENT_AMBULATORY_CARE_PROVIDER_SITE_OTHER): Payer: Self-pay | Admitting: Orthopaedic Surgery

## 2016-12-26 ENCOUNTER — Other Ambulatory Visit (INDEPENDENT_AMBULATORY_CARE_PROVIDER_SITE_OTHER): Payer: Self-pay | Admitting: Orthopaedic Surgery

## 2016-12-26 NOTE — Telephone Encounter (Signed)
Ok for refill? 

## 2016-12-26 NOTE — Telephone Encounter (Signed)
Called to pharmacy 

## 2016-12-26 NOTE — Telephone Encounter (Signed)
Ok refill ultram thanks

## 2016-12-27 DIAGNOSIS — R1084 Generalized abdominal pain: Secondary | ICD-10-CM | POA: Diagnosis not present

## 2016-12-27 DIAGNOSIS — K8020 Calculus of gallbladder without cholecystitis without obstruction: Secondary | ICD-10-CM | POA: Diagnosis not present

## 2016-12-27 DIAGNOSIS — Z86010 Personal history of colonic polyps: Secondary | ICD-10-CM | POA: Diagnosis not present

## 2016-12-28 DIAGNOSIS — R1084 Generalized abdominal pain: Secondary | ICD-10-CM | POA: Diagnosis not present

## 2016-12-30 ENCOUNTER — Other Ambulatory Visit: Payer: Self-pay

## 2016-12-30 NOTE — Patient Outreach (Signed)
[facility] [facility]) Care Management  12/30/2016  Christina Booker 1940/11/30 [id number]  Transition of care  Referral date: 11/30/16 Referral source: Insurance referral for transition of care Program: Transition of care / Heart failure Insurance: Health team advantage Providers: Dr. Gilford Rile, Dr. Lorin Mercy Social support: Live with spouse, Christina Booker. Spouse assist patient with her care. HIPAA: Patient gave verbal authorization to speak with her husband, Christina Booker regarding all of her health information.  Telephone call to patient for follow up. Patient states she is not able to talk at this time and request call back.  PLAN:  RNCM will attempt 2nd telephone outreach to patient within 1 week.  Christina Plowman RN,BSN,CCM [facility] Telephonic  [phone]

## 2017-01-02 ENCOUNTER — Other Ambulatory Visit: Payer: Self-pay

## 2017-01-02 NOTE — Patient Outreach (Signed)
[facility]) Care Management  01/02/2017  Christina Booker 1940/03/21 [id number]  Transition of care  Week # 4 Referral date: 11/30/16 Referral source: Insurance referral for transition of care Program: Transition of care / Heart failure Insurance: Health team advantage Providers: Dr. Gilford Rile, Dr. Lorin Mercy Social support: Live with spouse, Christina Booker. Spouse assist patient with her care. HIPAA: Patient gave verbal authorization to speak with her husband, Christina Booker regarding all of her health information.   Telephone call to patient for transition of care follow up. HIPAA verified. Patient states she still has some back pain at this time. Patient reports her pain level today is 2.  Patient states she is 6 weeks post op and has approximately 2 more weeks of wearing the back brace.  Patient reports she has been having more abdominal pain. Patient states she saw her gastroenterologist on 12/27/16. Patient states her doctor thinks it may be gall bladder related.  Patient states she is awaiting approval from her insurance company to have a CT scan done. Patient states she is going to see the surgeon on 1024/18.  Patient states she has been dealing with this abdominal pain off and on for several months. Patient states she had an endoscopy and ultrasound in February / March 2018.  Patient states the pain now is daily.  Patient reports she is continuing on her aspirin pill daily.  Patient aware to keep follow up appointments. Patient aware to contact her doctor if pain in either back or abdomen gets worse. RNCM advised patient to call 911 for severe symptoms.  Patient verbally agreed to next telephone outreach with [facility].   PLAN: RNCM will follow up with patient in November 2018.  Quinn Plowman RN,BSN,CCM [facility] Telephonic  [phone]

## 2017-01-04 DIAGNOSIS — K8010 Calculus of gallbladder with chronic cholecystitis without obstruction: Secondary | ICD-10-CM | POA: Diagnosis not present

## 2017-01-05 DIAGNOSIS — H1850 Unspecified hereditary corneal dystrophies: Secondary | ICD-10-CM | POA: Diagnosis not present

## 2017-01-05 DIAGNOSIS — Z01818 Encounter for other preprocedural examination: Secondary | ICD-10-CM | POA: Diagnosis not present

## 2017-01-05 DIAGNOSIS — H524 Presbyopia: Secondary | ICD-10-CM | POA: Diagnosis not present

## 2017-01-05 DIAGNOSIS — R9431 Abnormal electrocardiogram [ECG] [EKG]: Secondary | ICD-10-CM | POA: Diagnosis not present

## 2017-01-05 DIAGNOSIS — I110 Hypertensive heart disease with heart failure: Secondary | ICD-10-CM | POA: Diagnosis not present

## 2017-01-05 DIAGNOSIS — Z01810 Encounter for preprocedural cardiovascular examination: Secondary | ICD-10-CM | POA: Diagnosis not present

## 2017-01-05 DIAGNOSIS — K8080 Other cholelithiasis without obstruction: Secondary | ICD-10-CM | POA: Diagnosis not present

## 2017-01-05 DIAGNOSIS — I119 Hypertensive heart disease without heart failure: Secondary | ICD-10-CM | POA: Diagnosis not present

## 2017-01-05 DIAGNOSIS — I5031 Acute diastolic (congestive) heart failure: Secondary | ICD-10-CM | POA: Diagnosis not present

## 2017-01-05 DIAGNOSIS — I480 Paroxysmal atrial fibrillation: Secondary | ICD-10-CM | POA: Diagnosis not present

## 2017-01-09 DIAGNOSIS — R9431 Abnormal electrocardiogram [ECG] [EKG]: Secondary | ICD-10-CM | POA: Diagnosis not present

## 2017-01-16 DIAGNOSIS — Z7982 Long term (current) use of aspirin: Secondary | ICD-10-CM | POA: Diagnosis not present

## 2017-01-16 DIAGNOSIS — K589 Irritable bowel syndrome without diarrhea: Secondary | ICD-10-CM | POA: Diagnosis not present

## 2017-01-16 DIAGNOSIS — Z8673 Personal history of transient ischemic attack (TIA), and cerebral infarction without residual deficits: Secondary | ICD-10-CM | POA: Diagnosis not present

## 2017-01-16 DIAGNOSIS — K8020 Calculus of gallbladder without cholecystitis without obstruction: Secondary | ICD-10-CM | POA: Diagnosis not present

## 2017-01-16 DIAGNOSIS — E7800 Pure hypercholesterolemia, unspecified: Secondary | ICD-10-CM | POA: Diagnosis not present

## 2017-01-16 DIAGNOSIS — I10 Essential (primary) hypertension: Secondary | ICD-10-CM | POA: Diagnosis not present

## 2017-01-16 DIAGNOSIS — Z8679 Personal history of other diseases of the circulatory system: Secondary | ICD-10-CM | POA: Diagnosis not present

## 2017-01-16 DIAGNOSIS — K8010 Calculus of gallbladder with chronic cholecystitis without obstruction: Secondary | ICD-10-CM | POA: Diagnosis not present

## 2017-01-16 DIAGNOSIS — I4891 Unspecified atrial fibrillation: Secondary | ICD-10-CM | POA: Diagnosis not present

## 2017-01-16 DIAGNOSIS — Z79899 Other long term (current) drug therapy: Secondary | ICD-10-CM | POA: Diagnosis not present

## 2017-01-17 ENCOUNTER — Ambulatory Visit (INDEPENDENT_AMBULATORY_CARE_PROVIDER_SITE_OTHER): Payer: PPO | Admitting: Orthopaedic Surgery

## 2017-01-18 DIAGNOSIS — I10 Essential (primary) hypertension: Secondary | ICD-10-CM | POA: Diagnosis not present

## 2017-01-20 ENCOUNTER — Other Ambulatory Visit: Payer: Self-pay

## 2017-01-20 ENCOUNTER — Ambulatory Visit (INDEPENDENT_AMBULATORY_CARE_PROVIDER_SITE_OTHER): Payer: PPO | Admitting: Orthopaedic Surgery

## 2017-01-20 NOTE — Patient Outreach (Signed)
[facility]) Care Management  01/20/2017  Christina Booker [birth year] [id number]   Telephone assessment;  Telephone call to patient for follow up assessment. HIPAA verified. Patient reports she had gallbladder surgery on Monday 01/16/17. Patient states she was in the hospital as an observation. Patient reports receiving discharge instructions. Patient states she is doing ok.  RNCM discussed signs/ symptoms of infection with patient. Patient denies any signs of infection. Patient states she is now eating a regular diet and having normal bowel movements. Patient states her activity restrictions is light housekeeping with no lifting.  Patient states she has all of her medications and take is taking them as prescribed. Patient reports her follow up with her primary MD is 01/26/17, follow up with Dr. Lilia Pro, surgeon is 01/27/17, follow up with her cardiologist and orthopedic doctors are in two weeks.  Patient states she is aware of which doctor to call for problems and how to reach her doctors office after hours. Patient states she has transportation to her appointments. Patient denies any home health services.  RNCM advised patient to notify MD of any changes in condition prior to scheduled appointment. RNCM verified patient aware of 911 services for urgent/ emergent needs. Patient aware to call 24 hours nurse line if needed.   ASSESSMENT:  Status post gall bladder surgery per patient on 01/16/17.   PLAN; RNCM will follow up with patient within 1 week.   Quinn Plowman RN,BSN,CCM [facility] Telephonic  [phone]

## 2017-01-26 ENCOUNTER — Other Ambulatory Visit: Payer: Self-pay

## 2017-01-26 DIAGNOSIS — M150 Primary generalized (osteo)arthritis: Secondary | ICD-10-CM | POA: Diagnosis not present

## 2017-01-26 DIAGNOSIS — I480 Paroxysmal atrial fibrillation: Secondary | ICD-10-CM | POA: Diagnosis not present

## 2017-01-26 DIAGNOSIS — I5032 Chronic diastolic (congestive) heart failure: Secondary | ICD-10-CM | POA: Diagnosis not present

## 2017-01-26 DIAGNOSIS — I110 Hypertensive heart disease with heart failure: Secondary | ICD-10-CM | POA: Diagnosis not present

## 2017-01-26 NOTE — Patient Outreach (Signed)
[facility]) Care Management  01/26/2017  KAREEN JEFFERYS Sep 07, 1940 [id number]  Telephone assessment: Telephone call to patient for follow up. Unable to reach patient. HIPAA compliant voice message left with call back phone number.   PLAN:  RNCM will attempt 2nd telephone call to patient within 2 weeks.   Quinn Plowman RN,BSN,CCM [facility] Telephonic  [phone]

## 2017-01-27 ENCOUNTER — Ambulatory Visit: Payer: Self-pay

## 2017-01-27 DIAGNOSIS — Z79899 Other long term (current) drug therapy: Secondary | ICD-10-CM | POA: Diagnosis not present

## 2017-01-27 DIAGNOSIS — Z09 Encounter for follow-up examination after completed treatment for conditions other than malignant neoplasm: Secondary | ICD-10-CM | POA: Diagnosis not present

## 2017-01-27 DIAGNOSIS — E782 Mixed hyperlipidemia: Secondary | ICD-10-CM | POA: Diagnosis not present

## 2017-01-27 DIAGNOSIS — I10 Essential (primary) hypertension: Secondary | ICD-10-CM | POA: Diagnosis not present

## 2017-01-27 DIAGNOSIS — R7303 Prediabetes: Secondary | ICD-10-CM | POA: Diagnosis not present

## 2017-01-31 ENCOUNTER — Ambulatory Visit (INDEPENDENT_AMBULATORY_CARE_PROVIDER_SITE_OTHER): Payer: PPO | Admitting: Orthopaedic Surgery

## 2017-01-31 ENCOUNTER — Ambulatory Visit (INDEPENDENT_AMBULATORY_CARE_PROVIDER_SITE_OTHER): Payer: PPO

## 2017-01-31 ENCOUNTER — Encounter (INDEPENDENT_AMBULATORY_CARE_PROVIDER_SITE_OTHER): Payer: Self-pay | Admitting: Orthopaedic Surgery

## 2017-01-31 VITALS — BP 162/95 | HR 71 | Ht 60.0 in | Wt 163.0 lb

## 2017-01-31 DIAGNOSIS — Z981 Arthrodesis status: Secondary | ICD-10-CM

## 2017-01-31 DIAGNOSIS — M25551 Pain in right hip: Secondary | ICD-10-CM

## 2017-01-31 NOTE — Progress Notes (Signed)
Post-Op Visit Note   Patient: Christina Booker           Date of Birth: 08-02-1940           MRN: [id number] Visit Date: 01/31/2017 PCP: Raina Mina., MD   Assessment & Plan:  Chief Complaint:  Chief Complaint  Patient presents with  . Neck - Routine Post Op  . Lower Back - Routine Post Op   Patient about a week status post L3-4 tlif.  States that she continues to have some soreness in her low back. Preoperative leg symptoms resolved. Patient also complaining of lower abdominal discomfort. Status post cholecystectomy 2 weeks ago by general surgeon out of town.  Patient denies dysuria, hematuria, constipation.  Visit Diagnoses:  1. S/P lumbar fusion   2. Pain in right hip   Lower abdominal pain  Plan: Patient advised that it will take more time for her to get better. Try not to do to much too quickly.  Follow-up in 4 weeks for recheck. Advised her that she continues to have abdominal soreness that she should contact her primary care physician for general surgeon. we may consider getting CT abdomen if this still continues to be problem.    Follow-Up Instructions: Return in about 4 weeks (around 02/28/2017).   Orders:  Orders Placed This Encounter  Procedures  . XR Lumbar Spine 2-3 Views   No orders of the defined types were placed in this encounter.   Imaging: No results found.  PMFS History: Patient Active Problem List   Diagnosis Date Noted  . PONV (postoperative nausea and vomiting) 11/21/2016  . Lumbar stenosis 11/21/2016  . History of fusion of cervical spine 10/11/2016  . Cervical spinal stenosis 09/07/2016  . Spinal stenosis of lumbar region with neurogenic claudication 06/01/2016  . Cervical radiculopathy 05/12/2016  . Spinal stenosis of cervical region 05/12/2016  . Cervicalgia 05/12/2016  . Ganglion of left hand 12/22/2014  . S/P lumbar fusion 04/16/2014   Past Medical History:  Diagnosis Date  . Arthritis    hands, back  . Cervical spinal  stenosis   . Complication of anesthesia   . Constipation   . Headache   . Hot flashes   . Hyperlipidemia   . Hypertension   . Irritable bowel syndrome   . PONV (postoperative nausea and vomiting)   . Rosacea   . Stroke [facility]) 12/01/2009   tia    Family History  Problem Relation Age of Onset  . Cancer Mother   . Uterine cancer Mother   . Hypertension Mother   . Heart attack Father     Past Surgical History:  Procedure Laterality Date  . ABDOMINAL HYSTERECTOMY    . ANTERIOR CERVICAL DECOMP/DISCECTOMY FUSION N/A 09/07/2016   Procedure: C7-T1 Anterior Cervical Discectomy and Fusion, Allograft, Plate;  Surgeon: Marybelle Killings, MD;  Location: [place];  Service: Orthopedics;  Laterality: N/A;  . BACK SURGERY    . CARPAL TUNNEL RELEASE Right 2009  . CARPAL TUNNEL RELEASE Left   . CATARACT EXTRACTION, BILATERAL    . CERVICAL DISCECTOMY  09/07/2016   C7-T1 Anterior Cervical Discectomy and Fusion, Allograft, Plate (N/A)  . EYE SURGERY    . GANGLION CYST EXCISION Left 12/22/2014   Procedure: Excision Left Hand Ganglion cyst;  Surgeon: Marybelle Killings, MD;  Location: [place];  Service: Orthopedics;  Laterality: Left;  . HEMORRHOID SURGERY    . TUBAL LIGATION     Social History   Occupational  History  . Not on file  Tobacco Use  . Smoking status: Former Smoker    Last attempt to quit: 04/09/1992    Years since quitting: 24.8  . Smokeless tobacco: Never Used  Substance and Sexual Activity  . Alcohol use: Yes    Alcohol/week: 2.4 oz    Types: 4 Glasses of wine per week    Comment: 4  . Drug use: No  . Sexual activity: Not on file

## 2017-02-01 ENCOUNTER — Other Ambulatory Visit: Payer: Self-pay

## 2017-02-07 ENCOUNTER — Other Ambulatory Visit: Payer: Self-pay

## 2017-02-07 NOTE — Patient Outreach (Signed)
[facility]) Care Management  02/07/2017  Christina Booker 1940/10/04 [id number]   Telephone assessment: Attempt #2  Telephone call to patient for follow up. Unable to reach patient. HIPAA compliant voice message left with call back phone number.   PLAN:  RNCM will attempt 3rd telephone call to patient within 2 weeks.   Quinn Plowman RN,BSN,CCM [facility] Telephonic  [phone]

## 2017-02-07 NOTE — Patient Outreach (Signed)
[facility]) Care Management  02/07/2017  Christina Booker [birth year] [id number]   Case closure: Voice mail message received from patient. Call to patient for assessment follow up. HIPAA verified. Patient reports she is doing ok at this time. Patient states she is still having stomach discomfort on the right side into her groin area. Patient states her doctors are aware. Patient reports her pain level at a 4 and describes pain as discomfort.  Patient reports she is healing well from her gallbladder surgery. Patient states she is able to resume her normal activities. Patient states she saw her back surgeon and reported the continued pain in her abdomen and right groin. Patient states the doctor wants to see her back in 4 weeks. Patient states the doctor feels it may be arthritis. Patient states the doctor told her she could cancel the 4 week appointment if she started to feel better.  Patient states she is eating a regular diet and is having normal bowel movements. Patient reports seeing her primary MD approximately 1 to 1 1/2 weeks ago. Patient report the doctor discontinued her sleeping pill. RNCM advised patient to continue to take all other medications as directed by her doctor. Patient denies having any additional needs or concerns. Patient verbally agrees with closure to [facility] care management services.  RNCM advised patient to notify MD of any changes in condition prior to scheduled appointment. RNCM verified patient aware of 911 services for urgent/ emergent needs. Patient confirms she has contact phone number for [facility] care management and 24 hour nurse advise line.   ASSESSMENT: Patient goals met. Patient self managing care.  PLAN: RNCM will refer patient to care management assistant to close due to patient goals being met.  RNCM will notify patients primary MD of closure.  RNCM will send patient closure letter.   Quinn Plowman RN,BSN,CCM [facility] Telephonic   [phone]

## 2017-02-08 NOTE — Telephone Encounter (Signed)
This encounter was created in error - please disregard.

## 2017-02-10 ENCOUNTER — Ambulatory Visit: Payer: Self-pay

## 2017-02-22 DIAGNOSIS — H0012 Chalazion right lower eyelid: Secondary | ICD-10-CM | POA: Diagnosis not present

## 2017-02-22 DIAGNOSIS — H1859 Other hereditary corneal dystrophies: Secondary | ICD-10-CM | POA: Diagnosis not present

## 2017-02-28 ENCOUNTER — Ambulatory Visit (INDEPENDENT_AMBULATORY_CARE_PROVIDER_SITE_OTHER): Payer: PPO | Admitting: Orthopaedic Surgery

## 2017-02-28 ENCOUNTER — Ambulatory Visit (INDEPENDENT_AMBULATORY_CARE_PROVIDER_SITE_OTHER): Payer: PPO

## 2017-02-28 ENCOUNTER — Encounter (INDEPENDENT_AMBULATORY_CARE_PROVIDER_SITE_OTHER): Payer: Self-pay | Admitting: Orthopaedic Surgery

## 2017-02-28 VITALS — Ht 60.0 in | Wt 160.0 lb

## 2017-02-28 DIAGNOSIS — M25551 Pain in right hip: Secondary | ICD-10-CM

## 2017-02-28 DIAGNOSIS — Z981 Arthrodesis status: Secondary | ICD-10-CM | POA: Diagnosis not present

## 2017-02-28 NOTE — Progress Notes (Signed)
Office Visit Note   Patient: Christina Booker           Date of Birth: [birth year]           MRN: [id number] Visit Date: 02/28/2017              Requested by: Raina Mina., MD [address] [place], [place] [address] PCP: Raina Mina., MD   Assessment & Plan: Visit Diagnoses:  1. S/P lumbar fusion   2. Pain in right hip     Plan: Patient is continuing to have some discomfort in the midportion of her right groin with good hip range of motion.  She occasionally has some pain in the anterior compartment of her leg which is intermittent.  Usually lasts a few minutes and then goes away sometimes comes back later.  She is been walking in the house with her walker.  She can walk without a walker.  Preop pain is significantly better after removal of the extradural cyst which is causing severe compression.  She has a couple millimeters retrolisthesis at the L2-3 level.  Recheck 3 months.  Follow-Up Instructions: No Follow-up on file.   Orders:  Orders Placed This Encounter  Procedures  . XR Lumbar Spine 2-3 Views  . XR HIP UNILAT W OR W/O PELVIS 2-3 VIEWS RIGHT   No orders of the defined types were placed in this encounter.     Procedures: No procedures performed   Clinical Data: No additional findings.   Subjective: Chief Complaint  Patient presents with  . Lower Back - Routine Post Op  . Right Hip - Pain    HPI 76 year old female returns post L3 4T lift for extradural cyst and severe stenosis.  She is having some pain in the right anterior groin without limitation of hip range of motion.  She sometimes has some pain over her anterior calf and the anterior compartment the last for a few minutes and then goes away.  The severe pain she had preop is gone.  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  Review of Systems view of systems unchanged from her September 2018 surgery other than as mentioned in HPI.  She is off her pain medication.   Objective: Vital  Signs: Ht 5' (1.524 m)   Wt 160 lb (72.6 kg)   BMI 31.25 kg/m   Physical Exam  Constitutional: She is oriented to person, place, and time. She appears well-developed.  HENT:  Head: Normocephalic.  Right Ear: External ear normal.  Left Ear: External ear normal.  Eyes: Pupils are equal, round, and reactive to light.  Neck: No tracheal deviation present. No thyromegaly present.  Cardiovascular: Normal rate.  Pulmonary/Chest: Effort normal.  Abdominal: Soft.  Neurological: She is alert and oriented to person, place, and time.  Skin: Skin is warm and dry.  Psychiatric: She has a normal mood and affect. Her behavior is normal.    Ortho Exam patient has negative logroll right and left hip.  Mild tenderness to palpation mid anterior groin.  No tenderness over the trochanter.  Knees reach full extension negative straight leg raising anterior tib EHL is strong.  Sensation of the foot is normal.  Lumbar incision is well-healed.  Specialty Comments:  No specialty comments available.  Imaging: No results found.   PMFS History: Patient Active Problem List   Diagnosis Date Noted  . PONV (postoperative nausea and vomiting) 11/21/2016  . Lumbar stenosis 11/21/2016  . History of  fusion of cervical spine 10/11/2016  . Cervical spinal stenosis 09/07/2016  . Spinal stenosis of lumbar region with neurogenic claudication 06/01/2016  . Cervical radiculopathy 05/12/2016  . Spinal stenosis of cervical region 05/12/2016  . Cervicalgia 05/12/2016  . Ganglion of left hand 12/22/2014  . S/P lumbar fusion 04/16/2014   Past Medical History:  Diagnosis Date  . Arthritis    hands, back  . Cervical spinal stenosis   . Complication of anesthesia   . Constipation   . Headache   . Hot flashes   . Hyperlipidemia   . Hypertension   . Irritable bowel syndrome   . PONV (postoperative nausea and vomiting)   . Rosacea   . Stroke [facility]) 12/01/2009   tia    Family History  Problem Relation Age of Onset   . Cancer Mother   . Uterine cancer Mother   . Hypertension Mother   . Heart attack Father     Past Surgical History:  Procedure Laterality Date  . ABDOMINAL HYSTERECTOMY    . ANTERIOR CERVICAL DECOMP/DISCECTOMY FUSION N/A 09/07/2016   Procedure: C7-T1 Anterior Cervical Discectomy and Fusion, Allograft, Plate;  Surgeon: Marybelle Killings, MD;  Location: [place];  Service: Orthopedics;  Laterality: N/A;  . BACK SURGERY    . CARPAL TUNNEL RELEASE Right 2009  . CARPAL TUNNEL RELEASE Left   . CATARACT EXTRACTION, BILATERAL    . CERVICAL DISCECTOMY  09/07/2016   C7-T1 Anterior Cervical Discectomy and Fusion, Allograft, Plate (N/A)  . EYE SURGERY    . GANGLION CYST EXCISION Left 12/22/2014   Procedure: Excision Left Hand Ganglion cyst;  Surgeon: Marybelle Killings, MD;  Location: [place];  Service: Orthopedics;  Laterality: Left;  . HEMORRHOID SURGERY    . TUBAL LIGATION     Social History   Occupational History  . Not on file  Tobacco Use  . Smoking status: Former Smoker    Last attempt to quit: 04/09/1992    Years since quitting: 24.9  . Smokeless tobacco: Never Used  Substance and Sexual Activity  . Alcohol use: Yes    Alcohol/week: 2.4 oz    Types: 4 Glasses of wine per week    Comment: 4  . Drug use: No  . Sexual activity: Not on file

## 2017-03-03 DIAGNOSIS — H1859 Other hereditary corneal dystrophies: Secondary | ICD-10-CM | POA: Diagnosis not present

## 2017-03-03 DIAGNOSIS — H0012 Chalazion right lower eyelid: Secondary | ICD-10-CM | POA: Diagnosis not present

## 2017-03-23 DIAGNOSIS — I5032 Chronic diastolic (congestive) heart failure: Secondary | ICD-10-CM | POA: Diagnosis not present

## 2017-03-23 DIAGNOSIS — I480 Paroxysmal atrial fibrillation: Secondary | ICD-10-CM | POA: Diagnosis not present

## 2017-03-23 DIAGNOSIS — Z79899 Other long term (current) drug therapy: Secondary | ICD-10-CM | POA: Diagnosis not present

## 2017-03-23 DIAGNOSIS — M150 Primary generalized (osteo)arthritis: Secondary | ICD-10-CM | POA: Diagnosis not present

## 2017-05-30 ENCOUNTER — Encounter (INDEPENDENT_AMBULATORY_CARE_PROVIDER_SITE_OTHER): Payer: Self-pay | Admitting: Orthopaedic Surgery

## 2017-05-30 ENCOUNTER — Ambulatory Visit (INDEPENDENT_AMBULATORY_CARE_PROVIDER_SITE_OTHER): Payer: PPO | Admitting: Orthopaedic Surgery

## 2017-05-30 VITALS — BP 122/80 | HR 65

## 2017-05-30 DIAGNOSIS — Z981 Arthrodesis status: Secondary | ICD-10-CM

## 2017-05-30 NOTE — Progress Notes (Signed)
Office Visit Note   Patient: Christina Booker           Date of Birth: [birth year]           MRN: [id number] Visit Date: 05/30/2017              Requested by: Raina Mina., MD [address] [place], [place] [address] PCP: Raina Mina., MD   Assessment & Plan: Visit Diagnoses:  1. S/P lumbar fusion     Plan: Patient is fused from L3-L5 with satisfactory x-rays from her surgery from September 2018.  She has disc space narrowing at L2-3 with some by lateral foraminal narrowing.  This has progressed by MRI scan.  She may have some lateral recess irritation causing her left groin symptoms.  She will continue to work on a walking program and I plan to recheck her in 3 months.  If she has persistent symptoms she may require reimaging.  We discussed problems with adjacent level progression with multi-lumbar fusion in patients with progressive disc degeneration.  Currently she is neurologically intact and is ambulatory.  Tylenol and continued walking program discussed.  Follow-Up Instructions: Return in about 3 months (around 08/30/2017).   Orders:  No orders of the defined types were placed in this encounter.  No orders of the defined types were placed in this encounter.     Procedures: No procedures performed   Clinical Data: No additional findings.   Subjective: Chief Complaint  Patient presents with  . Lower Back - Follow-up  . Right Hip - Pain, Follow-up    HPI patient returns post right L3 4, L4-5 2 level  TLIF with pedicle instrumentation.  With removal of intraspinal extradural facet cyst.  Denies back pain.  She still has pain in her left hip region.  She is ambulating without a cane and is not limping.  She sometimes has pain in the anterior compartment of her left leg that tends to come and go.  No bowel or bladder associated symptoms.  Patient is not taking any pain medication.  Previous June 2018 C7-T1 ACDF with good relief of preop pain.  Previous left hand  ganglion cyst excision.  Review of Systems view of systems 14 point updated unchanged from September 2018 surgery other than as mentioned in HPI.   Objective: Vital Signs: BP 122/80   Pulse 65   Physical Exam  Constitutional: She is oriented to person, place, and time. She appears well-developed.  HENT:  Head: Normocephalic.  Right Ear: External ear normal.  Left Ear: External ear normal.  Eyes: Pupils are equal, round, and reactive to light.  Neck: No tracheal deviation present. No thyromegaly present.  Cardiovascular: Normal rate.  Pulmonary/Chest: Effort normal.  Abdominal: Soft.  Neurological: She is alert and oriented to person, place, and time.  Skin: Skin is warm and dry.  Psychiatric: She has a normal mood and affect. Her behavior is normal.    Ortho Exam negative logroll to the hips.  Good internal and external rotation both hips without pain.  No inguinal lymphadenopathy.  Hip flexors abductors, adductors are strong without deficit.  Anterior tib gastrocsoleus is intact.  Intact pedal pulses.  Specialty Comments:  No specialty comments available.  Imaging: No results found.   PMFS History: Patient Active Problem List   Diagnosis Date Noted  . PONV (postoperative nausea and vomiting) 11/21/2016  . Lumbar stenosis 11/21/2016  . History of fusion of cervical spine 10/11/2016  . Cervical spinal stenosis  09/07/2016  . Spinal stenosis of lumbar region with neurogenic claudication 06/01/2016  . Cervical radiculopathy 05/12/2016  . Spinal stenosis of cervical region 05/12/2016  . Cervicalgia 05/12/2016  . Ganglion of left hand 12/22/2014  . S/P lumbar fusion 04/16/2014   Past Medical History:  Diagnosis Date  . Arthritis    hands, back  . Cervical spinal stenosis   . Complication of anesthesia   . Constipation   . Headache   . Hot flashes   . Hyperlipidemia   . Hypertension   . Irritable bowel syndrome   . PONV (postoperative nausea and vomiting)   .  Rosacea   . Stroke [facility]) 12/01/2009   tia    Family History  Problem Relation Age of Onset  . Cancer Mother   . Uterine cancer Mother   . Hypertension Mother   . Heart attack Father     Past Surgical History:  Procedure Laterality Date  . ABDOMINAL HYSTERECTOMY    . ANTERIOR CERVICAL DECOMP/DISCECTOMY FUSION N/A 09/07/2016   Procedure: C7-T1 Anterior Cervical Discectomy and Fusion, Allograft, Plate;  Surgeon: Marybelle Killings, MD;  Location: [place];  Service: Orthopedics;  Laterality: N/A;  . BACK SURGERY    . CARPAL TUNNEL RELEASE Right 2009  . CARPAL TUNNEL RELEASE Left   . CATARACT EXTRACTION, BILATERAL    . CERVICAL DISCECTOMY  09/07/2016   C7-T1 Anterior Cervical Discectomy and Fusion, Allograft, Plate (N/A)  . EYE SURGERY    . GANGLION CYST EXCISION Left 12/22/2014   Procedure: Excision Left Hand Ganglion cyst;  Surgeon: Marybelle Killings, MD;  Location: [place];  Service: Orthopedics;  Laterality: Left;  . HEMORRHOID SURGERY    . TUBAL LIGATION     Social History   Occupational History  . Not on file  Tobacco Use  . Smoking status: Former Smoker    Last attempt to quit: 04/09/1992    Years since quitting: 25.1  . Smokeless tobacco: Never Used  Substance and Sexual Activity  . Alcohol use: Yes    Alcohol/week: 2.4 oz    Types: 4 Glasses of wine per week    Comment: 4  . Drug use: No  . Sexual activity: Not on file

## 2017-06-05 ENCOUNTER — Encounter (INDEPENDENT_AMBULATORY_CARE_PROVIDER_SITE_OTHER): Payer: Self-pay | Admitting: Orthopaedic Surgery

## 2017-06-16 DIAGNOSIS — M5416 Radiculopathy, lumbar region: Secondary | ICD-10-CM | POA: Diagnosis not present

## 2017-06-16 DIAGNOSIS — R1031 Right lower quadrant pain: Secondary | ICD-10-CM | POA: Diagnosis not present

## 2017-06-16 DIAGNOSIS — R102 Pelvic and perineal pain: Secondary | ICD-10-CM | POA: Diagnosis not present

## 2017-06-16 DIAGNOSIS — M150 Primary generalized (osteo)arthritis: Secondary | ICD-10-CM | POA: Diagnosis not present

## 2017-06-20 DIAGNOSIS — K7689 Other specified diseases of liver: Secondary | ICD-10-CM | POA: Diagnosis not present

## 2017-06-20 DIAGNOSIS — K5790 Diverticulosis of intestine, part unspecified, without perforation or abscess without bleeding: Secondary | ICD-10-CM | POA: Diagnosis not present

## 2017-06-20 DIAGNOSIS — N281 Cyst of kidney, acquired: Secondary | ICD-10-CM | POA: Diagnosis not present

## 2017-06-20 DIAGNOSIS — R1031 Right lower quadrant pain: Secondary | ICD-10-CM | POA: Diagnosis not present

## 2017-06-20 DIAGNOSIS — R102 Pelvic and perineal pain: Secondary | ICD-10-CM | POA: Diagnosis not present

## 2017-07-11 ENCOUNTER — Ambulatory Visit (INDEPENDENT_AMBULATORY_CARE_PROVIDER_SITE_OTHER): Payer: PPO | Admitting: Orthopaedic Surgery

## 2017-07-11 ENCOUNTER — Encounter (INDEPENDENT_AMBULATORY_CARE_PROVIDER_SITE_OTHER): Payer: Self-pay | Admitting: Orthopaedic Surgery

## 2017-07-11 VITALS — BP 178/92 | HR 62 | Ht 60.0 in | Wt 160.0 lb

## 2017-07-11 DIAGNOSIS — Z981 Arthrodesis status: Secondary | ICD-10-CM

## 2017-07-11 DIAGNOSIS — M9983 Other biomechanical lesions of lumbar region: Secondary | ICD-10-CM

## 2017-07-11 DIAGNOSIS — M48061 Spinal stenosis, lumbar region without neurogenic claudication: Secondary | ICD-10-CM

## 2017-07-11 NOTE — Progress Notes (Signed)
Office Visit Note   Patient: Christina Booker           Date of Birth: 07-28-1940           MRN: [id number] Visit Date: 07/11/2017              Requested by: Raina Mina., MD [place], [place] [address] PCP: Raina Mina., MD   Assessment & Plan: Visit Diagnoses:  1. S/P lumbar fusion   2. Lumbar foraminal stenosis     Plan: Patient has fusion L3-L5.  She now has some retrolisthesis at L2-3 with facet spurs worse on the right than left side and radicular symptoms.  We discussed going to the grocery store walking with a grocery cart for exercise trying to lose some weight to unload her back.  She can use gabapentin at night.  Be careful with ibuprofen in case it bothers her stomach.  I plan to recheck her in 3 months.  Follow-Up Instructions: Return in about 3 months (around 10/10/2017).   Orders:  No orders of the defined types were placed in this encounter.  No orders of the defined types were placed in this encounter.     Procedures: No procedures performed   Clinical Data: No additional findings.   Subjective: Chief Complaint  Patient presents with  . Lower Back - Follow-up, Pain  . Right Hip - Pain, Follow-up    HPI 77 year old female returns with ongoing problems with right buttock pain that radiates down into her thigh occasionally just past her knee.  She also occasionally has some mild groin discomfort but it seems separate from her buttocks and thigh pain.  Previous L3 4T lift September 2018.  Since that time she has had some progression of disc space narrowing at L2-3 and some retrolisthesis.  There is foraminal narrowing worse on the right than left that was noted on a CT scan of the abdomen and pelvis that was performed.  It appears that she has had early adjacent segment disc degeneration with instability after the L3-4 fusion.  First fusion L4-5 lasted 3 years before she got adjacent problems.  This time it seems to be less than a year.  No  associated bowel or bladder symptoms.  She does better if she leans over a grocery cart which is as expected she is used ibuprofen with not much relief.  Gabapentin tends to make her sleepy during the night she could take 2 tablets at night rather than one during the day.  Previous neck fusions doing well without problems.  Review of Systems view of systems updated and unchanged.   Objective: Vital Signs: BP (!) 178/92 (BP Location: Right Arm, Patient Position: Sitting, Cuff Size: Normal)   Pulse 62   Ht 5' (1.524 m)   Wt 160 lb (72.6 kg)   BMI 31.25 kg/m   Physical Exam  Constitutional: She is oriented to person, place, and time. She appears well-developed.  HENT:  Head: Normocephalic.  Right Ear: External ear normal.  Left Ear: External ear normal.  Eyes: Pupils are equal, round, and reactive to light.  Neck: No tracheal deviation present. No thyromegaly present.  Cardiovascular: Normal rate.  Pulmonary/Chest: Effort normal.  Abdominal: Soft.  Neurological: She is alert and oriented to person, place, and time.  Skin: Skin is warm and dry.  Psychiatric: She has a normal mood and affect. Her behavior is normal.    Ortho Exam mild discomfort with internal rotation of the  right hip.  No hip flexion contracture.  She has sciatic notch tenderness on the right well-healed lumbar incision.  She gets some relief with leaning forward.  Some discomfort with extension.  Mild trochanteric bursal tenderness reflexes are intact anterior tib gastrocsoleus is intact.  Specialty Comments:  No specialty comments available.  Imaging: No results found.   PMFS History: Patient Active Problem List   Diagnosis Date Noted  . PONV (postoperative nausea and vomiting) 11/21/2016  . Lumbar stenosis 11/21/2016  . History of fusion of cervical spine 10/11/2016  . Cervical spinal stenosis 09/07/2016  . Spinal stenosis of lumbar region with neurogenic claudication 06/01/2016  . Cervical radiculopathy  05/12/2016  . Spinal stenosis of cervical region 05/12/2016  . Cervicalgia 05/12/2016  . Ganglion of left hand 12/22/2014  . S/P lumbar fusion 04/16/2014   Past Medical History:  Diagnosis Date  . Arthritis    hands, back  . Cervical spinal stenosis   . Complication of anesthesia   . Constipation   . Headache   . Hot flashes   . Hyperlipidemia   . Hypertension   . Irritable bowel syndrome   . PONV (postoperative nausea and vomiting)   . Rosacea   . Stroke [facility]) 12/01/2009   tia    Family History  Problem Relation Age of Onset  . Cancer Mother   . Uterine cancer Mother   . Hypertension Mother   . Heart attack Father     Past Surgical History:  Procedure Laterality Date  . ABDOMINAL HYSTERECTOMY    . ANTERIOR CERVICAL DECOMP/DISCECTOMY FUSION N/A 09/07/2016   Procedure: C7-T1 Anterior Cervical Discectomy and Fusion, Allograft, Plate;  Surgeon: Marybelle Killings, MD;  Location: [place];  Service: Orthopedics;  Laterality: N/A;  . BACK SURGERY    . CARPAL TUNNEL RELEASE Right 2009  . CARPAL TUNNEL RELEASE Left   . CATARACT EXTRACTION, BILATERAL    . CERVICAL DISCECTOMY  09/07/2016   C7-T1 Anterior Cervical Discectomy and Fusion, Allograft, Plate (N/A)  . EYE SURGERY    . GANGLION CYST EXCISION Left 12/22/2014   Procedure: Excision Left Hand Ganglion cyst;  Surgeon: Marybelle Killings, MD;  Location: [facility];  Service: Orthopedics;  Laterality: Left;  . HEMORRHOID SURGERY    . TUBAL LIGATION     Social History   Occupational History  . Not on file  Tobacco Use  . Smoking status: Former Smoker    Last attempt to quit: 04/09/1992    Years since quitting: 25.2  . Smokeless tobacco: Never Used  Substance and Sexual Activity  . Alcohol use: Yes    Alcohol/week: 2.4 oz    Types: 4 Glasses of wine per week    Comment: 4  . Drug use: No  . Sexual activity: Not on file

## 2017-07-12 ENCOUNTER — Ambulatory Visit (INDEPENDENT_AMBULATORY_CARE_PROVIDER_SITE_OTHER): Payer: PPO | Admitting: Surgery

## 2017-07-27 DIAGNOSIS — I5032 Chronic diastolic (congestive) heart failure: Secondary | ICD-10-CM | POA: Diagnosis not present

## 2017-07-27 DIAGNOSIS — I480 Paroxysmal atrial fibrillation: Secondary | ICD-10-CM | POA: Diagnosis not present

## 2017-07-27 DIAGNOSIS — M150 Primary generalized (osteo)arthritis: Secondary | ICD-10-CM | POA: Diagnosis not present

## 2017-07-27 DIAGNOSIS — I110 Hypertensive heart disease with heart failure: Secondary | ICD-10-CM | POA: Diagnosis not present

## 2017-09-26 DIAGNOSIS — Z0000 Encounter for general adult medical examination without abnormal findings: Secondary | ICD-10-CM | POA: Diagnosis not present

## 2017-09-26 DIAGNOSIS — M5116 Intervertebral disc disorders with radiculopathy, lumbar region: Secondary | ICD-10-CM | POA: Diagnosis not present

## 2017-09-26 DIAGNOSIS — Z87891 Personal history of nicotine dependence: Secondary | ICD-10-CM | POA: Diagnosis not present

## 2017-09-26 DIAGNOSIS — I110 Hypertensive heart disease with heart failure: Secondary | ICD-10-CM | POA: Diagnosis not present

## 2017-09-26 DIAGNOSIS — M150 Primary generalized (osteo)arthritis: Secondary | ICD-10-CM | POA: Diagnosis not present

## 2017-09-26 DIAGNOSIS — E782 Mixed hyperlipidemia: Secondary | ICD-10-CM | POA: Diagnosis not present

## 2017-09-26 DIAGNOSIS — I5032 Chronic diastolic (congestive) heart failure: Secondary | ICD-10-CM | POA: Diagnosis not present

## 2017-09-26 DIAGNOSIS — Z79899 Other long term (current) drug therapy: Secondary | ICD-10-CM | POA: Diagnosis not present

## 2017-09-26 DIAGNOSIS — R7303 Prediabetes: Secondary | ICD-10-CM | POA: Diagnosis not present

## 2017-10-05 DIAGNOSIS — E782 Mixed hyperlipidemia: Secondary | ICD-10-CM | POA: Diagnosis not present

## 2017-10-05 DIAGNOSIS — I10 Essential (primary) hypertension: Secondary | ICD-10-CM | POA: Diagnosis not present

## 2017-10-05 DIAGNOSIS — R7303 Prediabetes: Secondary | ICD-10-CM | POA: Diagnosis not present

## 2017-10-05 DIAGNOSIS — Z79899 Other long term (current) drug therapy: Secondary | ICD-10-CM | POA: Diagnosis not present

## 2017-10-16 DIAGNOSIS — E0590 Thyrotoxicosis, unspecified without thyrotoxic crisis or storm: Secondary | ICD-10-CM | POA: Diagnosis not present

## 2017-11-09 DIAGNOSIS — I5032 Chronic diastolic (congestive) heart failure: Secondary | ICD-10-CM | POA: Diagnosis not present

## 2017-11-09 DIAGNOSIS — Z79899 Other long term (current) drug therapy: Secondary | ICD-10-CM | POA: Diagnosis not present

## 2017-11-09 DIAGNOSIS — N183 Chronic kidney disease, stage 3 (moderate): Secondary | ICD-10-CM | POA: Diagnosis not present

## 2017-11-09 DIAGNOSIS — M150 Primary generalized (osteo)arthritis: Secondary | ICD-10-CM | POA: Diagnosis not present

## 2017-11-09 DIAGNOSIS — E0590 Thyrotoxicosis, unspecified without thyrotoxic crisis or storm: Secondary | ICD-10-CM | POA: Diagnosis not present

## 2017-11-09 DIAGNOSIS — F5101 Primary insomnia: Secondary | ICD-10-CM | POA: Diagnosis not present

## 2017-11-14 DIAGNOSIS — Z1231 Encounter for screening mammogram for malignant neoplasm of breast: Secondary | ICD-10-CM | POA: Diagnosis not present

## 2017-12-20 ENCOUNTER — Ambulatory Visit (INDEPENDENT_AMBULATORY_CARE_PROVIDER_SITE_OTHER): Payer: PPO

## 2017-12-20 ENCOUNTER — Ambulatory Visit (INDEPENDENT_AMBULATORY_CARE_PROVIDER_SITE_OTHER): Payer: PPO | Admitting: Orthopaedic Surgery

## 2017-12-20 ENCOUNTER — Encounter (INDEPENDENT_AMBULATORY_CARE_PROVIDER_SITE_OTHER): Payer: Self-pay | Admitting: Orthopaedic Surgery

## 2017-12-20 VITALS — Ht 60.0 in | Wt 170.0 lb

## 2017-12-20 DIAGNOSIS — M1611 Unilateral primary osteoarthritis, right hip: Secondary | ICD-10-CM | POA: Diagnosis not present

## 2017-12-20 DIAGNOSIS — M25561 Pain in right knee: Secondary | ICD-10-CM

## 2017-12-20 DIAGNOSIS — M25551 Pain in right hip: Secondary | ICD-10-CM | POA: Diagnosis not present

## 2017-12-20 DIAGNOSIS — Z981 Arthrodesis status: Secondary | ICD-10-CM

## 2017-12-21 DIAGNOSIS — E0590 Thyrotoxicosis, unspecified without thyrotoxic crisis or storm: Secondary | ICD-10-CM | POA: Diagnosis not present

## 2017-12-25 DIAGNOSIS — N183 Chronic kidney disease, stage 3 (moderate): Secondary | ICD-10-CM | POA: Diagnosis not present

## 2017-12-25 DIAGNOSIS — E0590 Thyrotoxicosis, unspecified without thyrotoxic crisis or storm: Secondary | ICD-10-CM | POA: Diagnosis not present

## 2017-12-25 DIAGNOSIS — M150 Primary generalized (osteo)arthritis: Secondary | ICD-10-CM | POA: Diagnosis not present

## 2017-12-27 ENCOUNTER — Telehealth (INDEPENDENT_AMBULATORY_CARE_PROVIDER_SITE_OTHER): Payer: Self-pay | Admitting: Orthopaedic Surgery

## 2017-12-27 ENCOUNTER — Encounter (INDEPENDENT_AMBULATORY_CARE_PROVIDER_SITE_OTHER): Payer: Self-pay | Admitting: Orthopaedic Surgery

## 2017-12-27 ENCOUNTER — Other Ambulatory Visit (INDEPENDENT_AMBULATORY_CARE_PROVIDER_SITE_OTHER): Payer: Self-pay | Admitting: Orthopaedic Surgery

## 2017-12-27 DIAGNOSIS — M1611 Unilateral primary osteoarthritis, right hip: Secondary | ICD-10-CM

## 2017-12-27 NOTE — Progress Notes (Signed)
Office Visit Note              Patient: Christina Booker                                        Date of Birth: 12/12/1940                                                    MRN: [id number] Visit Date: 12/20/2017                                                                     Requested by: Grisso, Greg A., MD [address] [place], [place] [address] PCP: Grisso, Greg A., MD   Assessment & Plan: Visit Diagnoses:  1. Acute pain of right knee   2. Pain in right hip   3. S/P lumbar fusion   4. Unilateral primary osteoarthritis, right hip     Plan: Progressive right hip osteoarthritis with loss of joint space since December 2018 images with subchondral sclerosis and marginal osteophytes, subchondral cyst formation.  She is having significant pain not responsive to anti-inflammatories, gabapentin and Tylenol.  She is used a cane and at times a walker due to her severe hip pain.  When she turns overnight it wakes her up at night.  We discussed options of intra-articular injection versus total hip arthroplasty and she states she would like to proceed with total hip arthroplasty since her pain is progressed and at times she now rates it is severe and limits her ambulation.  Patient's husband is present today and included in the discussion.  We discussed risks of infection, femur fracture, revision surgery.  Options of spinal anesthesia was discussed.  Questions elicited and answered she understands request we proceed.  Follow-Up Instructions: No follow-ups on file.   Orders:     Orders Placed This Encounter  Procedures  . XR Pelvis 1-2 Views  . XR Knee 1-2 Views Right  . XR Lumbar Spine 2-3 Views   No orders of the defined types were placed in this encounter.     Procedures: No procedures performed   Clinical Data: No additional findings.   Subjective:    Chief Complaint  Patient presents with  . Lower Back - Pain  . Right Hip - Pain  . Right Knee - Pain     HPI 77-year-old female returns with progressive right groin pain that radiates into her thigh down stops just above her knee.  She is had some episodes where she is fallen to the hip giving way.  She is taken Celebrex without relief Tylenol 6 tablets a day we discussed not exceeding the recommended dosage.  She has been amatory with a left hip limp.  She denies associated back pain with this.  Previous L4-5 fusion with Gill procedure 2016 and later L3-4 fusion September 2018 with good relief of claudication symptoms.  She gets relief from her hip with sitting she is been ambulating with a cane without relief.    Review of Systems positive for lumbar spine fusion L3-L5.  Cervical fusion C7-T1 solid.  Positive history of postoperative nausea and vomiting.  Previous cholecystectomy 2018 doing well.  Right hip osteoarthritis with progressive symptoms otherwise negative is a pertains HPI   Objective: Vital Signs: Ht 5' (1.524 m)   Wt 170 lb (77.1 kg)   BMI 33.20 kg/m   Physical Exam  Constitutional: She is oriented to person, place, and time. She appears well-developed.  HENT:  Head: Normocephalic.  Right Ear: External ear normal.  Left Ear: External ear normal.  Eyes: Pupils are equal, round, and reactive to light.  Neck: No tracheal deviation present. No thyromegaly present.  Cardiovascular: Normal rate.  Pulmonary/Chest: Effort normal.  Abdominal: Soft.  Neurological: She is alert and oriented to person, place, and time.  Skin: Skin is warm and dry.  Psychiatric: She has a normal mood and affect. Her behavior is normal.    Ortho Exam patient has well-healed lumbar incision.  No abductor hip flexion weakness.  She has 10 degrees internal rotation right hip with sharp severe pain which reproduces her symptoms external rotation 30 degrees.  She can cross her left leg over her right but not right over the left due to limitation of right hip range of motion.  No knee crepitus.  Distal  pulses are 2+.  Specialty Comments:  No specialty comments available.  Imaging: No results found.   PMFS History:     Patient Active Problem List   Diagnosis Date Noted  . PONV (postoperative nausea and vomiting) 11/21/2016  . Lumbar stenosis 11/21/2016  . History of fusion of cervical spine 10/11/2016  . Cervical spinal stenosis 09/07/2016  . Spinal stenosis of lumbar region with neurogenic claudication 06/01/2016  . Cervical radiculopathy 05/12/2016  . Spinal stenosis of cervical region 05/12/2016  . Cervicalgia 05/12/2016  . Ganglion of left hand 12/22/2014  . S/P lumbar fusion 04/16/2014       Past Medical History:  Diagnosis Date  . Arthritis    hands, back  . Cervical spinal stenosis   . Complication of anesthesia   . Constipation   . Headache   . Hot flashes   . Hyperlipidemia   . Hypertension   . Irritable bowel syndrome   . PONV (postoperative nausea and vomiting)   . Rosacea   . Stroke ([facility]) 12/01/2009   tia         Family History  Problem Relation Age of Onset  . Cancer Mother   . Uterine cancer Mother   . Hypertension Mother   . Heart attack Father          Past Surgical History:  Procedure Laterality Date  . ABDOMINAL HYSTERECTOMY    . ANTERIOR CERVICAL DECOMP/DISCECTOMY FUSION N/A 09/07/2016   Procedure: C7-T1 Anterior Cervical Discectomy and Fusion, Allograft, Plate;  Surgeon: Jermani Pund C, MD;  Location[facility];  Service: Orthopedics;  Laterality: N/A;  . BACK SURGERY    . CARPAL TUNNEL RELEASE Right 2009  . CARPAL TUNNEL RELEASE Left   . CATARACT EXTRACTION, BILATERAL    . CERVICAL DISCECTOMY  09/07/2016   C7-T1 Anterior Cervical Discectomy and Fusion, Allograft, Plate (N/A)  . EYE SURGERY    . GANGLION CYST EXCISION Left 12/22/2014   Procedure: Excision Left Hand Ganglion cyst;  Surgeon: Vernica Wachtel C Liboria Putnam, MD;  Location: [facility];  Service: Orthopedics;  Laterality: Left;  . HEMORRHOID  SURGERY    . TUBAL LIGATION     

## 2017-12-27 NOTE — Telephone Encounter (Signed)
Patient called asked if she can get Home Health to come out to her home after surgery. Patient said there is a company out of [facility] that send someone out.  Patient said she do not know the name of the company. The fax#  to fax the order is [phone]  The ph# is [phone]

## 2017-12-27 NOTE — Telephone Encounter (Signed)
I called discussed. Husband has some problems and she needs help with him. We can take care of this post op. Nothing to be done at this time

## 2017-12-27 NOTE — Telephone Encounter (Signed)
Please advise 

## 2017-12-28 DIAGNOSIS — E0590 Thyrotoxicosis, unspecified without thyrotoxic crisis or storm: Secondary | ICD-10-CM | POA: Diagnosis not present

## 2017-12-29 ENCOUNTER — Other Ambulatory Visit: Payer: Self-pay

## 2017-12-29 ENCOUNTER — Encounter (HOSPITAL_COMMUNITY)
Admission: RE | Admit: 2017-12-29 | Discharge: 2017-12-29 | Disposition: A | Discharge status: Home / Self Care | Payer: PPO | Source: Ambulatory Visit | Attending: Orthopaedic Surgery | Admitting: Orthopaedic Surgery

## 2017-12-29 ENCOUNTER — Encounter (HOSPITAL_COMMUNITY): Payer: Self-pay | Admitting: Emergency Medicine

## 2017-12-29 ENCOUNTER — Telehealth (INDEPENDENT_AMBULATORY_CARE_PROVIDER_SITE_OTHER): Payer: Self-pay

## 2017-12-29 ENCOUNTER — Encounter (HOSPITAL_COMMUNITY): Payer: Self-pay

## 2017-12-29 DIAGNOSIS — Z01812 Encounter for preprocedural laboratory examination: Secondary | ICD-10-CM | POA: Insufficient documentation

## 2017-12-29 DIAGNOSIS — E785 Hyperlipidemia, unspecified: Secondary | ICD-10-CM | POA: Insufficient documentation

## 2017-12-29 DIAGNOSIS — I110 Hypertensive heart disease with heart failure: Secondary | ICD-10-CM | POA: Diagnosis not present

## 2017-12-29 DIAGNOSIS — Z8673 Personal history of transient ischemic attack (TIA), and cerebral infarction without residual deficits: Secondary | ICD-10-CM | POA: Diagnosis not present

## 2017-12-29 DIAGNOSIS — E041 Nontoxic single thyroid nodule: Secondary | ICD-10-CM | POA: Diagnosis not present

## 2017-12-29 DIAGNOSIS — I5030 Unspecified diastolic (congestive) heart failure: Secondary | ICD-10-CM | POA: Diagnosis not present

## 2017-12-29 DIAGNOSIS — M1611 Unilateral primary osteoarthritis, right hip: Secondary | ICD-10-CM | POA: Insufficient documentation

## 2017-12-29 DIAGNOSIS — I480 Paroxysmal atrial fibrillation: Secondary | ICD-10-CM | POA: Diagnosis not present

## 2017-12-29 HISTORY — DX: Paroxysmal atrial fibrillation: I48.0

## 2017-12-29 HISTORY — DX: Thyrotoxicosis, unspecified without thyrotoxic crisis or storm: E05.90

## 2017-12-29 LAB — URINALYSIS, ROUTINE W REFLEX MICROSCOPIC
BILIRUBIN URINE: NEGATIVE
Glucose, UA: NEGATIVE mg/dL
HGB URINE DIPSTICK: NEGATIVE
KETONES UR: NEGATIVE mg/dL
Leukocytes, UA: NEGATIVE
NITRITE: NEGATIVE
PROTEIN: NEGATIVE mg/dL
SPECIFIC GRAVITY, URINE: 1.014 (ref 1.005–1.030)
pH: 5 (ref 5.0–8.0)

## 2017-12-29 LAB — CBC
HCT: 43.6 % (ref 36.0–46.0)
Hemoglobin: 13.4 g/dL (ref 12.0–15.0)
MCH: 28.1 pg (ref 26.0–34.0)
MCHC: 30.7 g/dL (ref 30.0–36.0)
MCV: 91.4 fL (ref 80.0–100.0)
PLATELETS: 240 10*3/uL (ref 150–400)
RBC: 4.77 MIL/uL (ref 3.87–5.11)
RDW: 13.4 % (ref 11.5–15.5)
WBC: 9.1 10*3/uL (ref 4.0–10.5)
nRBC: 0 % (ref 0.0–0.2)

## 2017-12-29 LAB — COMPREHENSIVE METABOLIC PANEL
ALBUMIN: 3.7 g/dL (ref 3.5–5.0)
ALT: 18 U/L (ref 0–44)
ANION GAP: 5 (ref 5–15)
AST: 21 U/L (ref 15–41)
Alkaline Phosphatase: 84 U/L (ref 38–126)
BUN: 17 mg/dL (ref 8–23)
CO2: 24 mmol/L (ref 22–32)
Calcium: 9.2 mg/dL (ref 8.9–10.3)
Chloride: 110 mmol/L (ref 98–111)
Creatinine, Ser: 1.04 mg/dL — ABNORMAL HIGH (ref 0.44–1.00)
GFR calc Af Amer: 59 mL/min — ABNORMAL LOW (ref >60)
GFR calc non Af Amer: 50 mL/min — ABNORMAL LOW (ref >60)
GLUCOSE: 90 mg/dL (ref 70–99)
POTASSIUM: 3.9 mmol/L (ref 3.5–5.1)
SODIUM: 139 mmol/L (ref 135–145)
Total Bilirubin: 1.1 mg/dL (ref 0.3–1.2)
Total Protein: 6.7 g/dL (ref 6.5–8.1)

## 2017-12-29 LAB — SURGICAL PCR SCREEN
MRSA, PCR: NEGATIVE
STAPHYLOCOCCUS AUREUS: NEGATIVE

## 2017-12-29 NOTE — Telephone Encounter (Signed)
Please see below.

## 2017-12-29 NOTE — Telephone Encounter (Signed)
Per Dr. Lavella Hammock so not stable for surgery cancel surgery we can get it rescheduled in few weeks.  Let Jackelyn Poling know thanks

## 2017-12-29 NOTE — Progress Notes (Signed)
PCP is Dr. Terence Lux from [place] 12/2017  She currently is having issues with her thyroid, (She states no symptoms)  Due to go back on Monday, 01/01/2018.  Dr. Bea Graff has written a note stating she is not ready for surgery yet.    I have called over to Dr. Lorin Mercy' office and had to leave VM regarding this.  Per the patient, back in 2018 after her back surgery, she had a-fib and heart failure. Was seen by Cardio Dr. Assunta Curtis 12/2016. (she currently does not see him now)  Stress test (per the pt) was done.  She currently denies murmur, cp, sob.

## 2017-12-29 NOTE — Pre-Procedure Instructions (Signed)
Christina Booker  12/29/2017      [facility] [place], [address] [place] [address] Phone: [phone] Fax: [phone]    Your procedure is scheduled on Friday, Oct. 25th   Report to [facility] Admitting at 5:30 AM             (posted surgery time 7:30a - 10a)   Call this number if you have problems the morning of surgery:  [phone]   Remember:   Do not eat any foods or drink any liquids after midnight, Thursday.                          5 days prior to surgery, STOP TAKING ANY Vitamins, Herbal Supplements, Anti-inflammatories.   Take these medicines the morning of surgery with A SIP OF WATER : Wellbutrin, Neurontin.     Do not wear jewelry, make-up or nail polish.  Do not wear lotions, powders,  perfumes, or deodorant.  Do not shave 48 hours prior to surgery.    Do not bring valuables to the hospital.  [facility] is not responsible for any belongings or valuables.  Contacts, dentures or bridgework may not be worn into surgery.  Leave your suitcase in the car.  After surgery it may be brought to your room.  For patients admitted to the hospital, discharge time will be determined by your treatment team.  Please read over the following fact sheets that you were given. MRSA Information and Surgical Site Infection Prevention       [facility]- Preparing For Surgery  Before surgery, you can play an important role. Because skin is not sterile, your skin needs to be as free of germs as possible. You can reduce the number of germs on your skin by washing with CHG (chlorahexidine gluconate) Soap before surgery.  CHG is an antiseptic cleaner which kills germs and bonds with the skin to continue killing germs even after washing.    Oral Hygiene is also important to reduce your risk of infection.    Remember - BRUSH YOUR TEETH THE MORNING OF SURGERY WITH YOUR REGULAR TOOTHPASTE  Please do not use if you  have an allergy to CHG or antibacterial soaps. If your skin becomes reddened/irritated stop using the CHG.  Do not shave (including legs and underarms) for at least 48 hours prior to first CHG shower. It is OK to shave your face.  Please follow these instructions carefully.   1. Shower the NIGHT BEFORE SURGERY and the MORNING OF SURGERY with CHG.   2. If you chose to wash your hair, wash your hair first as usual with your normal shampoo.  3. After you shampoo, rinse your hair and body thoroughly to remove the shampoo.  4. Use CHG as you would any other liquid soap. You can apply CHG directly to the skin and wash gently with a scrungie or a clean washcloth.   5. Apply the CHG Soap to your body ONLY FROM THE NECK DOWN.  Do not use on open wounds or open sores. Avoid contact with your eyes, ears, mouth and genitals (private parts). Wash Face and genitals (private parts)  with your normal soap.  6. Wash thoroughly, paying special attention to the area where your surgery will be performed.  7. Thoroughly rinse your body with warm water from the neck down.  8. DO NOT shower/wash with your  normal soap after using and rinsing off the CHG Soap.  9. Pat yourself dry with a CLEAN TOWEL.  10. Wear CLEAN PAJAMAS to bed the night before surgery, wear comfortable clothes the morning of surgery  11. Place CLEAN SHEETS on your bed the night of your first shower and DO NOT SLEEP WITH PETS.  Day of Surgery:  Do not apply any deodorants/lotions.  Please wear clean clothes to the hospital/surgery center.    Remember to brush your teeth WITH YOUR REGULAR TOOTHPASTE.

## 2017-12-29 NOTE — Telephone Encounter (Signed)
Debbie from [facility] states she just saw patient. She is suppose to have hip SU with Dr Lorin Mercy on the 25th. Note in chart from  Dr. Bea Graff 12/25/17- having thyroid issues. Dr wants more thyroid testing. Plan is she is being seen and set up today as a pre op clearance she is not stable to have SU at the moment per his note that was dated 12/25/17 in [place]. Just wanted to let Dr Lorin Mercy know. She is going back to see Dr. Bea Graff on Monday. Jackelyn Poling would like a CB just so she can know that Dr Lorin Mercy is aware.   CB [phone]  She will  Be there until 3:30pm today.

## 2017-12-29 NOTE — Telephone Encounter (Signed)
Left voicemail for Christina Booker advising.

## 2018-01-01 DIAGNOSIS — E0590 Thyrotoxicosis, unspecified without thyrotoxic crisis or storm: Secondary | ICD-10-CM | POA: Diagnosis not present

## 2018-01-01 DIAGNOSIS — E010 Iodine-deficiency related diffuse (endemic) goiter: Secondary | ICD-10-CM | POA: Diagnosis not present

## 2018-01-01 NOTE — Progress Notes (Addendum)
Anesthesia Chart Review:  Case:  [id number] Date/Time:  01/05/18 0715   Procedure:  RIGHT TOTAL HIP ARTHROPLASTY ANTERIOR APPROACH (Right )   Anesthesia type:  Spinal   Pre-op diagnosis:  right hip osteoarthritis   Location:  [facility] OR ROOM 06 / [place] OR   Surgeon:  Marybelle Killings, MD      DISCUSSION: Patient is a 77 year old female scheduled for the above procedure.   History includes former smoker (quit '94), post-operative N/V, HTN, HLD, PAF (afib with RVR 11/2016, converted with Cardizem; declined anticoagulation), diastolic CHF, TIA ~ 8242, subclinical hyperthyroidism.   She saw her PCP Gilford Rile, MD on 12/25/17 with follow-up 12/21/17 thyroid studies now showing suppressed TSH and elevated T3 and free T4 (previuosly followed for subclinical hyperthyroidism). He did consider labs interference from Biotin, but with all three tests being abnormal he recommended further evaluation of hyperthyroidism. He wrote, " This patient with seen and set up today as a preoperative clearance. She is not stable to have surgery. This thyroid would need to be evaluated. I have ordered an ultrasound of her thyroid as well as a nuclear thyroid uptake. We did discuss the possibility that biotin could be causing lab error. I will repeat the TSH, free T4, and total T3 in a week. She will stop the biotin completely. Because she is considering surgery I will update her CMP as well as blood count. I will make further recommendations after review of the labs." I don't see that labs have been repeated since then, nor her U/S. He thyroid nuclear medicine study (day 1) 01/01/18, so results are not yet available.    I spoke with Jackelyn Poling at Dr. Lorin Mercy' office. He is aware that patient is undergoing evaluation for hyperthyroidism. She is getting some testing this week, so he is awaiting follow-up input for Dr. Bea Graff before making a decision regarding rescheduling her surgery date.  She had a cardiology evaluation in 2018 following  hospitalization for afib with RVR and diastolic CHF. She was cleared for cholecystectomy later that same year after a stress test thru [facility]. Still awaiting 2018 stress and echo results. Chart will be left for follow-up.   ADDENDUM 01/04/18 9:06 AM:  Echo and stress reports received and outlined below. Also there is an encounter from 01/03/18 indicating that Dr. Bea Graff has recommended the THA be postponed for at least six weeks while undergoing evaluation/treatment for hyperthyroidism.   VS: BP (!) 133/109 Comment: notified Debbie RN  Pulse 76   Temp (!) 36.4 C   Resp 20   Ht 5' (1.524 m)   Wt 77.1 kg   SpO2 100%   BMI 33.20 kg/m  BP 136/80 12/25/17 and 138/80 11/09/17 at Dr. Willette Pa office.  Last three BP readings in [facility]: BP Readings from Last 3 Encounters:  12/29/17 (!) 133/109  07/11/17 (!) 178/92  05/30/17 122/80    PROVIDERS: Raina Mina., MD is PCP ([place]) Sherryl Barters, MD is cardiologist ([place]). Patient seen 01/05/17 for preoperative CV exam before cholecystectomy.    LABS: Preoperative labs from 12/29/17 reviewed. Awaiting repeat thyroid studies from Dr. Bea Graff. (all labs ordered are listed, but only abnormal results are displayed)  Labs Reviewed  COMPREHENSIVE METABOLIC PANEL - Abnormal; Notable for the following components:      Result Value   Creatinine, Ser 1.04 (*)    GFR calc non Af Amer 50 (*)    GFR calc Af Amer 59 (*)  All other components within normal limits  SURGICAL PCR SCREEN  CBC  URINALYSIS, ROUTINE W REFLEX MICROSCOPIC   12/21/17 thyroid studies ([place]): TSH      0.416 (L) Free T4   1.5 (H) Total T3  243 (H)   IMAGES:   EKG: 12/29/17: NSR. Artifact present. Minimal voltage criteria for LVH, may be normal variant. Inferior infarct (age undetermined). Anterolateral infarct (age undetermined). The interpreting cardiologist felt there was no  significant change since last tracing 08/29/16. R wave progression in V3-6 appears worse when compared to 01/05/17 tracing from Dr. Rhetta Mura office.   CV: Nuclear stress test 01/10/17 (Dr. Wyline Copas): Uneventful Lexiscan injection.  Normal SPECT perfusion imaging.  No significant ischemia detected.  No evidence of infarct.  Clinically negative stress test with no chest pain.  Normal left ventricular systolic function.  No stress-induced arrhythmias.  Exercise tolerance was not assessed due to a pharmacologic stress. Calculated EF 68%.  Echo 11/25/16 [facility]):  Conclusions: 1. Overall left ventricular systolic function is normal with an ejection fraction between 60 and 65%. 2. The diastolic filling pattern indicates impaired relaxation. 3. There is mild aortic valve sclerosis. There is no evidence of aortic stenosis.  Stress echo 11/05/2013 ([place]; scanned under Media tab): No evidence stress-induced wall motion abnormalities. Poor exercise capacity.    Past Medical History:  Diagnosis Date  . Arthritis    hands, back  . Cervical spinal stenosis   . Complication of anesthesia   . Constipation   . Headache   . Hot flashes   . Hyperlipidemia   . Hypertension   . Hyperthyroidism    current problem  . Irritable bowel syndrome   . PAF (paroxysmal atrial fibrillation) ([place])    declined asnticoagulation  . PONV (postoperative nausea and vomiting)   . Rosacea   . Stroke [facility]) 12/01/2009   tia  2010    Past Surgical History:  Procedure Laterality Date  . ABDOMINAL HYSTERECTOMY    . ANTERIOR CERVICAL DECOMP/DISCECTOMY FUSION N/A 09/07/2016   Procedure: C7-T1 Anterior Cervical Discectomy and Fusion, Allograft, Plate;  Surgeon: Marybelle Killings, MD;  Location: [place];  Service: Orthopedics;  Laterality: N/A;  . BACK SURGERY    . CARPAL TUNNEL RELEASE Right 2009  . CARPAL TUNNEL RELEASE Left   . CATARACT EXTRACTION, BILATERAL    . CERVICAL DISCECTOMY   09/07/2016   C7-T1 Anterior Cervical Discectomy and Fusion, Allograft, Plate (N/A)  . CHOLECYSTECTOMY    . EYE SURGERY    . GANGLION CYST EXCISION Left 12/22/2014   Procedure: Excision Left Hand Ganglion cyst;  Surgeon: Marybelle Killings, MD;  Location: [place];  Service: Orthopedics;  Laterality: Left;  . HEMORRHOID SURGERY    . TUBAL LIGATION      MEDICATIONS: . acetaminophen (TYLENOL) 500 MG tablet  . aspirin EC 81 MG tablet  . atorvastatin (LIPITOR) 20 MG tablet  . Biotin 5 MG CAPS  . buPROPion (WELLBUTRIN XL) 150 MG 24 hr tablet  . celecoxib (CELEBREX) 200 MG capsule  . Cholecalciferol (VITAMIN D3) 1000 units CAPS  . diphenhydrAMINE (BENADRYL) 25 mg capsule  . gabapentin (NEURONTIN) 300 MG capsule  . glycerin adult 2 g suppository  . HYDROcodone-acetaminophen (NORCO/VICODIN) 5-325 MG tablet  . lisinopril (PRINIVIL,ZESTRIL) 5 MG tablet  . polyethylene glycol powder (GLYCOLAX/MIRALAX) powder  . promethazine (PHENERGAN) 12.5 MG tablet  . traMADol (ULTRAM) 50 MG tablet  . Turmeric Curcumin 500 MG CAPS  . Wheat Dextrin (BENEFIBER  PO)   No current facility-administered medications for this encounter.     George Hugh [facility] Short Stay Center/Anesthesiology Phone [phone] 01/01/2018 4:23 PM

## 2018-01-02 DIAGNOSIS — E0590 Thyrotoxicosis, unspecified without thyrotoxic crisis or storm: Secondary | ICD-10-CM | POA: Diagnosis not present

## 2018-01-02 DIAGNOSIS — I5032 Chronic diastolic (congestive) heart failure: Secondary | ICD-10-CM | POA: Diagnosis not present

## 2018-01-02 DIAGNOSIS — E042 Nontoxic multinodular goiter: Secondary | ICD-10-CM | POA: Diagnosis not present

## 2018-01-02 DIAGNOSIS — I480 Paroxysmal atrial fibrillation: Secondary | ICD-10-CM | POA: Diagnosis not present

## 2018-01-02 DIAGNOSIS — E041 Nontoxic single thyroid nodule: Secondary | ICD-10-CM | POA: Diagnosis not present

## 2018-01-03 ENCOUNTER — Telehealth (INDEPENDENT_AMBULATORY_CARE_PROVIDER_SITE_OTHER): Payer: Self-pay | Admitting: Radiology

## 2018-01-03 ENCOUNTER — Other Ambulatory Visit (INDEPENDENT_AMBULATORY_CARE_PROVIDER_SITE_OTHER): Payer: Self-pay

## 2018-01-03 NOTE — Telephone Encounter (Signed)
Ok thank you 

## 2018-01-03 NOTE — Telephone Encounter (Signed)
Received call from [facility] with Dr. Willette Pa office (PCP). Patient has over active thyroid and they are working to get that under control. Dr. Bea Graff prefers we put off patient's total hip arthroplasty for atleast six weeks as she is undergoing treatment.  CB for [employer]  [phone]

## 2018-01-05 ENCOUNTER — Encounter (HOSPITAL_COMMUNITY): Admission: RE | Payer: Self-pay | Source: Ambulatory Visit

## 2018-01-05 ENCOUNTER — Inpatient Hospital Stay (HOSPITAL_COMMUNITY): Admission: RE | Admit: 2018-01-05 | Payer: PPO | Source: Ambulatory Visit | Admitting: Orthopaedic Surgery

## 2018-01-05 SURGERY — ARTHROPLASTY, HIP, TOTAL, ANTERIOR APPROACH
Anesthesia: Spinal | Laterality: Right

## 2018-01-08 ENCOUNTER — Telehealth (INDEPENDENT_AMBULATORY_CARE_PROVIDER_SITE_OTHER): Payer: Self-pay | Admitting: Orthopaedic Surgery

## 2018-01-08 DIAGNOSIS — E042 Nontoxic multinodular goiter: Secondary | ICD-10-CM | POA: Diagnosis not present

## 2018-01-08 DIAGNOSIS — E0510 Thyrotoxicosis with toxic single thyroid nodule without thyrotoxic crisis or storm: Secondary | ICD-10-CM | POA: Diagnosis not present

## 2018-01-08 DIAGNOSIS — E0520 Thyrotoxicosis with toxic multinodular goiter without thyrotoxic crisis or storm: Secondary | ICD-10-CM | POA: Diagnosis not present

## 2018-01-08 NOTE — Telephone Encounter (Signed)
Dr.Matthew Thyroid Specialist   [name] came into the office wanted to inform Christina Booker and [name] that she is ready to schedule surgery. Dr.Matthew gave her the ok to proceed with surgery.

## 2018-01-10 ENCOUNTER — Telehealth (INDEPENDENT_AMBULATORY_CARE_PROVIDER_SITE_OTHER): Payer: Self-pay | Admitting: Orthopaedic Surgery

## 2018-01-10 NOTE — Telephone Encounter (Signed)
Patient called and stated that she had been cleared for surgery.  Please call her so she can r/s surgery. [phone]

## 2018-01-12 NOTE — Telephone Encounter (Signed)
IC patient. She stated she had called our office everyday this week and left multiple messages trying to get scheduled for surgery. I s/w Malachy Mood and she will call patient today about getting scheduled. Apologized to patient for delay.

## 2018-01-12 NOTE — Telephone Encounter (Signed)
Patient is very frustrated because she has not received a call from anyone. I did tell her Jackelyn Poling has not been in the office and that's why she hasnt heard back from her. She is trying to get her surgery scheduled as soon as possible but in the mean time is requesting a call from you Gwinda Passe, patients # [phone]

## 2018-01-17 NOTE — Progress Notes (Signed)
Patient's original surgery date was changed until the 15th.  She did call me and informed me of 2 more medications she is taking.  Both were added to list.

## 2018-01-19 ENCOUNTER — Inpatient Hospital Stay (INDEPENDENT_AMBULATORY_CARE_PROVIDER_SITE_OTHER): Payer: PPO | Admitting: Orthopaedic Surgery

## 2018-01-19 ENCOUNTER — Other Ambulatory Visit (INDEPENDENT_AMBULATORY_CARE_PROVIDER_SITE_OTHER): Payer: Self-pay

## 2018-01-24 NOTE — Pre-Procedure Instructions (Signed)
Christina Booker  01/24/2018      [facility] [place], [address] [place] [address] Phone: [phone] Fax: [phone]    Your procedure is scheduled on January 26, 2018.  Report to [facility] Admitting at 530 AM.  Call this number if you have problems the morning of surgery:  [phone]   Remember:  Do not eat or drink after midnight.    Take these medicines the morning of surgery with A SIP OF WATER Bupropion (wellbutrin) Gabapentin (neurontin) Methimazole (tapazole) Pantoprazole (protonix)  Follow your surgeon's instructions on when to hold/resume aspirin.  If no instructions were given call the office to determine how they would like to you take aspirin  7 days prior to surgery STOP taking any celebrex, Aleve, Naproxen, Ibuprofen, Motrin, Advil, Goody's, BC's, all herbal medications, fish oil, and all vitamins    Do not wear jewelry, make-up or nail polish.  Do not wear lotions, powders, or perfumes, or deodorant.  Do not shave 48 hours prior to surgery.    Do not bring valuables to the hospital.  [facility] is not responsible for any belongings or valuables.  Contacts, dentures or bridgework may not be worn into surgery.  Leave your suitcase in the car.  After surgery it may be brought to your room.  For patients admitted to the hospital, discharge time will be determined by your treatment team.  Patients discharged the day of surgery will not be allowed to drive home.    [place]- Preparing For Surgery  Before surgery, you can play an important role. Because skin is not sterile, your skin needs to be as free of germs as possible. You can reduce the number of germs on your skin by washing with CHG (chlorahexidine gluconate) Soap before surgery.  CHG is an antiseptic cleaner which kills germs and bonds with the skin to continue killing germs even after washing.    Oral Hygiene is also  important to reduce your risk of infection.  Remember - BRUSH YOUR TEETH THE MORNING OF SURGERY WITH YOUR REGULAR TOOTHPASTE  Please do not use if you have an allergy to CHG or antibacterial soaps. If your skin becomes reddened/irritated stop using the CHG.  Do not shave (including legs and underarms) for at least 48 hours prior to first CHG shower. It is OK to shave your face.  Please follow these instructions carefully.   1. Shower the NIGHT BEFORE SURGERY and the MORNING OF SURGERY with CHG.   2. If you chose to wash your hair, wash your hair first as usual with your normal shampoo.  3. After you shampoo, rinse your hair and body thoroughly to remove the shampoo.  4. Use CHG as you would any other liquid soap. You can apply CHG directly to the skin and wash gently with a scrungie or a clean washcloth.   5. Apply the CHG Soap to your body ONLY FROM THE NECK DOWN.  Do not use on open wounds or open sores. Avoid contact with your eyes, ears, mouth and genitals (private parts). Wash Face and genitals (private parts)  with your normal soap.  6. Wash thoroughly, paying special attention to the area where your surgery will be performed.  7. Thoroughly rinse your body with warm water from the neck down.  8. DO NOT shower/wash with your normal soap after using and rinsing off the CHG Soap.  9. Pat yourself  dry with a CLEAN TOWEL.  10. Wear CLEAN PAJAMAS to bed the night before surgery, wear comfortable clothes the morning of surgery  11. Place CLEAN SHEETS on your bed the night of your first shower and DO NOT SLEEP WITH PETS.  Day of Surgery:  Do not apply any deodorants/lotions.  Please wear clean clothes to the hospital/surgery center.   Remember to brush your teeth WITH YOUR REGULAR TOOTHPASTE.  Please read over the following fact sheets that you were given. Pain Booklet, Coughing and Deep Breathing and Surgical Site Infection Prevention

## 2018-01-25 ENCOUNTER — Other Ambulatory Visit: Payer: Self-pay

## 2018-01-25 ENCOUNTER — Encounter (HOSPITAL_COMMUNITY)
Admission: RE | Admit: 2018-01-25 | Discharge: 2018-01-25 | Disposition: A | Discharge status: Home / Self Care | Payer: PPO | Source: Ambulatory Visit | Attending: Orthopaedic Surgery | Admitting: Orthopaedic Surgery

## 2018-01-25 ENCOUNTER — Encounter (HOSPITAL_COMMUNITY)
Admission: RE | Admit: 2018-01-25 | Discharge: 2018-01-25 | Disposition: A | Discharge status: Home / Self Care | Payer: PPO | Source: Ambulatory Visit | Attending: Surgery | Admitting: Surgery

## 2018-01-25 ENCOUNTER — Encounter (HOSPITAL_COMMUNITY): Payer: Self-pay

## 2018-01-25 DIAGNOSIS — Z9049 Acquired absence of other specified parts of digestive tract: Secondary | ICD-10-CM | POA: Insufficient documentation

## 2018-01-25 DIAGNOSIS — Z01818 Encounter for other preprocedural examination: Secondary | ICD-10-CM | POA: Insufficient documentation

## 2018-01-25 DIAGNOSIS — Z87891 Personal history of nicotine dependence: Secondary | ICD-10-CM | POA: Diagnosis not present

## 2018-01-25 DIAGNOSIS — D62 Acute posthemorrhagic anemia: Secondary | ICD-10-CM | POA: Diagnosis not present

## 2018-01-25 DIAGNOSIS — Z471 Aftercare following joint replacement surgery: Secondary | ICD-10-CM | POA: Diagnosis not present

## 2018-01-25 DIAGNOSIS — Z96641 Presence of right artificial hip joint: Secondary | ICD-10-CM | POA: Diagnosis not present

## 2018-01-25 DIAGNOSIS — I480 Paroxysmal atrial fibrillation: Secondary | ICD-10-CM | POA: Diagnosis not present

## 2018-01-25 DIAGNOSIS — Z8673 Personal history of transient ischemic attack (TIA), and cerebral infarction without residual deficits: Secondary | ICD-10-CM | POA: Diagnosis not present

## 2018-01-25 DIAGNOSIS — Z79899 Other long term (current) drug therapy: Secondary | ICD-10-CM | POA: Diagnosis not present

## 2018-01-25 DIAGNOSIS — R0602 Shortness of breath: Secondary | ICD-10-CM | POA: Diagnosis not present

## 2018-01-25 DIAGNOSIS — M1611 Unilateral primary osteoarthritis, right hip: Secondary | ICD-10-CM | POA: Diagnosis not present

## 2018-01-25 DIAGNOSIS — Z7982 Long term (current) use of aspirin: Secondary | ICD-10-CM | POA: Diagnosis not present

## 2018-01-25 DIAGNOSIS — I10 Essential (primary) hypertension: Secondary | ICD-10-CM | POA: Diagnosis not present

## 2018-01-25 DIAGNOSIS — Z791 Long term (current) use of non-steroidal anti-inflammatories (NSAID): Secondary | ICD-10-CM | POA: Diagnosis not present

## 2018-01-25 DIAGNOSIS — Z981 Arthrodesis status: Secondary | ICD-10-CM | POA: Diagnosis not present

## 2018-01-25 DIAGNOSIS — E785 Hyperlipidemia, unspecified: Secondary | ICD-10-CM | POA: Diagnosis not present

## 2018-01-25 LAB — CBC
HEMATOCRIT: 44.3 % (ref 36.0–46.0)
Hemoglobin: 13.4 g/dL (ref 12.0–15.0)
MCH: 28 pg (ref 26.0–34.0)
MCHC: 30.2 g/dL (ref 30.0–36.0)
MCV: 92.5 fL (ref 80.0–100.0)
NRBC: 0 % (ref 0.0–0.2)
Platelets: 251 10*3/uL (ref 150–400)
RBC: 4.79 MIL/uL (ref 3.87–5.11)
RDW: 12.9 % (ref 11.5–15.5)
WBC: 8.8 10*3/uL (ref 4.0–10.5)

## 2018-01-25 LAB — COMPREHENSIVE METABOLIC PANEL
ALBUMIN: 3.9 g/dL (ref 3.5–5.0)
ALT: 18 U/L (ref 0–44)
AST: 20 U/L (ref 15–41)
Alkaline Phosphatase: 80 U/L (ref 38–126)
Anion gap: 9 (ref 5–15)
BILIRUBIN TOTAL: 1.1 mg/dL (ref 0.3–1.2)
BUN: 11 mg/dL (ref 8–23)
CHLORIDE: 107 mmol/L (ref 98–111)
CO2: 22 mmol/L (ref 22–32)
Calcium: 9.3 mg/dL (ref 8.9–10.3)
Creatinine, Ser: 1.11 mg/dL — ABNORMAL HIGH (ref 0.44–1.00)
GFR calc Af Amer: 54 mL/min — ABNORMAL LOW (ref >60)
GFR calc non Af Amer: 47 mL/min — ABNORMAL LOW (ref >60)
GLUCOSE: 100 mg/dL — AB (ref 70–99)
POTASSIUM: 3.9 mmol/L (ref 3.5–5.1)
Sodium: 138 mmol/L (ref 135–145)
TOTAL PROTEIN: 7 g/dL (ref 6.5–8.1)

## 2018-01-25 LAB — URINALYSIS, ROUTINE W REFLEX MICROSCOPIC
Bilirubin Urine: NEGATIVE
GLUCOSE, UA: NEGATIVE mg/dL
HGB URINE DIPSTICK: NEGATIVE
Ketones, ur: NEGATIVE mg/dL
Leukocytes, UA: NEGATIVE
Nitrite: NEGATIVE
PH: 6 (ref 5.0–8.0)
Protein, ur: NEGATIVE mg/dL
SPECIFIC GRAVITY, URINE: 1.006 (ref 1.005–1.030)

## 2018-01-25 NOTE — Progress Notes (Signed)
Anesthesia Chart Review:  Case:  [id number] Date/Time:  01/26/18 0715   Procedure:  RIGHT TOTAL HIP ARTHROPLASTY ANTERIOR APPROACH (Right )   Anesthesia type:  Spinal   Pre-op diagnosis:  Osteoarthritis Right Hip   Location:  [facility] OR ROOM 06 / [place] OR   Surgeon:  Marybelle Killings, MD      DISCUSSION: Patient is a 77 year old female scheduled for the above procedure. Surgery was initially scheduled for 01/05/18, but was postponed by her PCP Dr. Bea Graff for further evaluation and management of hyperthyroidism. According to notes (in [place]), since then she has had a thyroid uptake scan showing a toxic nodule (hot nodule in the left inferior thyroid lobe) and thyroid U/S showing multiple nodules (a large right nodule met criteria for biopsy). She was referred to endocrinologist Dr. Denton Lank. On 01/08/18, he wrote, "As long as she continues methimazole, she is safe to proceed with surgery. We will set up biopsy for right sided nodule in January." Thyroid labs studies on 01/08/18 showed a normal free T4 0.9 and normal total T3 at 104.   History includes former smoker (quit '94), post-operative N/V, HTN, HLD, PAF (afib with RVR 11/2016, converted with Cardizem; declined anticoagulation), diastolic CHF, TIA ~ 0454, hyperthyroidism (started on Tapazole 12/2017).   She had a non-ischemic stress test in 2018, and echo showed normal LVEF without significant valvular disease.  She remains on Tapazole. CXR report from 01/25/18 is still pending, but based on currently available information, I would anticipate that she can proceed as planned if no acute changes.   VS: BP 131/89   Pulse 77   Temp 36.4 C   Resp 20   Ht 5' (1.524 m)   Wt 77.6 kg   SpO2 99%   BMI 33.42 kg/m    PROVIDERS: Raina Mina., MD is PCP ([place]) - Sherryl Barters, MD is cardiologist ([place]). Patient seen 01/05/17 for preoperative CV exam before cholecystectomy.  Kathlene Cote, MD is  endocrinologist ([place])   LABS: Labs reviewed: Acceptable for surgery. (all labs ordered are listed, but only abnormal results are displayed)  Labs Reviewed  COMPREHENSIVE METABOLIC PANEL - Abnormal; Notable for the following components:      Result Value   Glucose, Bld 100 (*)    Creatinine, Ser 1.11 (*)    GFR calc non Af Amer 47 (*)    GFR calc Af Amer 54 (*)    All other components within normal limits  URINALYSIS, ROUTINE W REFLEX MICROSCOPIC - Abnormal; Notable for the following components:   Color, Urine STRAW (*)    All other components within normal limits  CBC     IMAGES: CXR 01/25/18: In process.   EKG: 12/29/17: NSR. Artifact present. Minimal voltage criteria for LVH, may be normal variant. Inferior infarct (age undetermined). Anterolateral infarct (age undetermined). The interpreting cardiologist felt there was no significant change since last tracing 08/29/16. R wave progression in V3-6 appears worse when compared to 01/05/17 tracing from Dr. Rhetta Mura office.   CV: Nuclear stress test 01/10/17 (Dr. Wyline Copas): Uneventful Lexiscan injection.  Normal SPECT perfusion imaging.  No significant ischemia detected.  No evidence of infarct.  Clinically negative stress test with no chest pain.  Normal left ventricular systolic function.  No stress-induced arrhythmias.  Exercise tolerance was not assessed due to a pharmacologic stress. Calculated EF 68%.  Echo 11/25/16 [facility]):  Conclusions: 1. Overall left ventricular systolic function is normal  with an ejection fraction between 60 and 65%. 2. The diastolic filling pattern indicates impaired relaxation. 3. There is mild aortic valve sclerosis. There is no evidence of aortic stenosis.  Stress echo 11/05/2013 ([place]; scanned under Media tab): No evidence stress-induced wall motion abnormalities. Poor exercise capacity.    Past Medical History:  Diagnosis Date  . Arthritis     hands, back  . Cervical spinal stenosis   . Complication of anesthesia   . Constipation   . Headache   . Hot flashes   . Hyperlipidemia   . Hypertension   . Hyperthyroidism    current problem  . Irritable bowel syndrome   . PAF (paroxysmal atrial fibrillation) ([place])    declined asnticoagulation  . PONV (postoperative nausea and vomiting)   . Rosacea   . Stroke [facility]) 12/01/2009   tia  2010    Past Surgical History:  Procedure Laterality Date  . ABDOMINAL HYSTERECTOMY    . ANTERIOR CERVICAL DECOMP/DISCECTOMY FUSION N/A 09/07/2016   Procedure: C7-T1 Anterior Cervical Discectomy and Fusion, Allograft, Plate;  Surgeon: Marybelle Killings, MD;  Location: [place];  Service: Orthopedics;  Laterality: N/A;  . BACK SURGERY    . CARPAL TUNNEL RELEASE Right 2009  . CARPAL TUNNEL RELEASE Left   . CATARACT EXTRACTION, BILATERAL    . CERVICAL DISCECTOMY  09/07/2016   C7-T1 Anterior Cervical Discectomy and Fusion, Allograft, Plate (N/A)  . CHOLECYSTECTOMY    . EYE SURGERY    . GANGLION CYST EXCISION Left 12/22/2014   Procedure: Excision Left Hand Ganglion cyst;  Surgeon: Marybelle Killings, MD;  Location: [place];  Service: Orthopedics;  Laterality: Left;  . HEMORRHOID SURGERY    . TUBAL LIGATION      MEDICATIONS: . acetaminophen (TYLENOL) 500 MG tablet  . aspirin EC 81 MG tablet  . atorvastatin (LIPITOR) 20 MG tablet  . Biotin 5 MG CAPS  . buPROPion (WELLBUTRIN XL) 150 MG 24 hr tablet  . celecoxib (CELEBREX) 200 MG capsule  . Cholecalciferol (VITAMIN D3) 1000 units CAPS  . diphenhydrAMINE (BENADRYL) 25 mg capsule  . gabapentin (NEURONTIN) 300 MG capsule  . glycerin adult 2 g suppository  . HYDROcodone-acetaminophen (NORCO/VICODIN) 5-325 MG tablet  . lisinopril (PRINIVIL,ZESTRIL) 5 MG tablet  . methimazole (TAPAZOLE) 5 MG tablet  . pantoprazole (PROTONIX) 40 MG tablet  . polyethylene glycol powder (GLYCOLAX/MIRALAX) powder  . promethazine (PHENERGAN) 12.5 MG tablet  .  traMADol (ULTRAM) 50 MG tablet  . Turmeric Curcumin 500 MG CAPS  . Wheat Dextrin (BENEFIBER PO)   No current facility-administered medications for this encounter.     George Hugh [facility] Short Stay Center/Anesthesiology Phone [phone] 01/25/2018 4:46 PM

## 2018-01-25 NOTE — Progress Notes (Addendum)
PCP: Gilford Rile Pt is a DNR - bringing paperwork in on DOS  PCR done within past 30 days - did not reswab Results - negative  Stress Test - 2018, can't remember where she had it done, results were normal (stated by pt)  Pt denies SOB, cough, fever, chest pain  Pt stated understanding of instructions given for day of surgery.

## 2018-01-25 NOTE — Anesthesia Preprocedure Evaluation (Addendum)
Anesthesia Evaluation  Patient identified by MRN, date of birth, ID band Patient awake    Reviewed: Allergy & Precautions, NPO status , Patient's Chart, lab work & pertinent test results  History of Anesthesia Complications (+) PONVNegative for: history of anesthetic complications  Airway Mallampati: II  TM Distance: >3 FB Neck ROM: Full    Dental no notable dental hx.    Pulmonary neg pulmonary ROS, former smoker,    Pulmonary exam normal        Cardiovascular hypertension, Pt. on medications Normal cardiovascular exam+ dysrhythmias (paroxysmal AF, no AC) Atrial Fibrillation   Nuclear stress test 01/10/17 (Dr. Wyline Copas): Uneventful Lexiscan injection.  Normal SPECT perfusion imaging.  No significant ischemia detected.  No evidence of infarct.  Clinically negative stress test with no chest pain.  Normal left ventricular systolic function.  No stress-induced arrhythmias.  Exercise tolerance was not assessed due to a pharmacologic stress. Calculated EF 68%.  Echo 11/25/16 [facility]):  Conclusions: 1. Overall left ventricular systolic function is normal with an ejection fraction between 60 and 65%. 2. The diastolic filling pattern indicates impaired relaxation. 3. There is mild aortic valve sclerosis. There is no evidence of aortic stenosis.   Neuro/Psych TIAnegative psych ROS   GI/Hepatic Neg liver ROS, GERD  ,  Endo/Other  Hyperthyroidism (currently treated with methimazole, stable; seen and cleared by her endocrinologist)   Renal/GU negative Renal ROS  negative genitourinary   Musculoskeletal  (+) Arthritis , Osteoarthritis,    Abdominal   Peds  Hematology negative hematology ROS (+)   Anesthesia Other Findings Denies use of anticoagulants or hx of bleeding disorder. Plts 251.  Reproductive/Obstetrics                           Anesthesia Physical Anesthesia Plan  ASA: III  Anesthesia  Plan: Spinal   Post-op Pain Management:    Induction:   PONV Risk Score and Plan: 3 and Ondansetron, Treatment may vary due to age or medical condition, TIVA and Propofol infusion  Airway Management Planned: Natural Airway, Nasal Cannula and Simple Face Mask  Additional Equipment: None  Intra-op Plan:   Post-operative Plan:   Informed Consent: I have reviewed the patients History and Physical, chart, labs and discussed the procedure including the risks, benefits and alternatives for the proposed anesthesia with the patient or authorized representative who has indicated his/her understanding and acceptance.     Plan Discussed with:   Anesthesia Plan Comments: (PAT note written 01/25/2018 by Myra Gianotti, PA-C. Started on Tapazole 12/2017. Has endocrinology clearance.  )      Anesthesia Quick Evaluation

## 2018-01-26 ENCOUNTER — Inpatient Hospital Stay (HOSPITAL_COMMUNITY): Payer: PPO

## 2018-01-26 ENCOUNTER — Encounter (HOSPITAL_COMMUNITY): Payer: Self-pay

## 2018-01-26 ENCOUNTER — Inpatient Hospital Stay (HOSPITAL_COMMUNITY): Payer: PPO | Admitting: Vascular Surgery

## 2018-01-26 ENCOUNTER — Inpatient Hospital Stay (HOSPITAL_COMMUNITY): Payer: PPO | Admitting: Certified Registered Nurse Anesthetist

## 2018-01-26 ENCOUNTER — Encounter (HOSPITAL_COMMUNITY)
Admission: RE | Disposition: A | Discharge status: Home Health | Payer: Self-pay | Source: Ambulatory Visit | Attending: Orthopaedic Surgery

## 2018-01-26 ENCOUNTER — Inpatient Hospital Stay (HOSPITAL_COMMUNITY)
Admission: RE | Admit: 2018-01-26 | Discharge: 2018-01-29 | DRG: 470 | Disposition: A | Discharge status: Home Health | Payer: PPO | Source: Ambulatory Visit | Attending: Orthopaedic Surgery | Admitting: Orthopaedic Surgery

## 2018-01-26 DIAGNOSIS — I480 Paroxysmal atrial fibrillation: Secondary | ICD-10-CM | POA: Diagnosis present

## 2018-01-26 DIAGNOSIS — Z79899 Other long term (current) drug therapy: Secondary | ICD-10-CM

## 2018-01-26 DIAGNOSIS — D62 Acute posthemorrhagic anemia: Secondary | ICD-10-CM | POA: Diagnosis not present

## 2018-01-26 DIAGNOSIS — Z8673 Personal history of transient ischemic attack (TIA), and cerebral infarction without residual deficits: Secondary | ICD-10-CM

## 2018-01-26 DIAGNOSIS — I10 Essential (primary) hypertension: Secondary | ICD-10-CM | POA: Diagnosis present

## 2018-01-26 DIAGNOSIS — Z87891 Personal history of nicotine dependence: Secondary | ICD-10-CM

## 2018-01-26 DIAGNOSIS — M1611 Unilateral primary osteoarthritis, right hip: Secondary | ICD-10-CM | POA: Diagnosis present

## 2018-01-26 DIAGNOSIS — Z419 Encounter for procedure for purposes other than remedying health state, unspecified: Secondary | ICD-10-CM

## 2018-01-26 DIAGNOSIS — E785 Hyperlipidemia, unspecified: Secondary | ICD-10-CM | POA: Diagnosis present

## 2018-01-26 DIAGNOSIS — Z7982 Long term (current) use of aspirin: Secondary | ICD-10-CM

## 2018-01-26 DIAGNOSIS — Z981 Arthrodesis status: Secondary | ICD-10-CM | POA: Diagnosis not present

## 2018-01-26 DIAGNOSIS — Z791 Long term (current) use of non-steroidal anti-inflammatories (NSAID): Secondary | ICD-10-CM

## 2018-01-26 DIAGNOSIS — Z09 Encounter for follow-up examination after completed treatment for conditions other than malignant neoplasm: Secondary | ICD-10-CM

## 2018-01-26 HISTORY — DX: Unilateral primary osteoarthritis, right hip: M16.11

## 2018-01-26 HISTORY — PX: TOTAL HIP ARTHROPLASTY: SHX124

## 2018-01-26 SURGERY — ARTHROPLASTY, HIP, TOTAL, ANTERIOR APPROACH
Anesthesia: Spinal | Laterality: Right

## 2018-01-26 MED ORDER — VITAMIN D 25 MCG (1000 UNIT) PO TABS
1000.0000 [IU] | ORAL_TABLET | Freq: Two times a day (BID) | ORAL | Status: DC
  Administered 2018-01-26 – 2018-01-29 (×7): 1000 [IU] via ORAL
  Filled 2018-01-26 (×10): qty 1

## 2018-01-26 MED ORDER — BUPROPION HCL ER (XL) 150 MG PO TB24
150.0000 mg | ORAL_TABLET | Freq: Every morning | ORAL | Status: DC
  Administered 2018-01-27 – 2018-01-29 (×3): 150 mg via ORAL
  Filled 2018-01-26 (×4): qty 1

## 2018-01-26 MED ORDER — HYDROMORPHONE HCL 1 MG/ML IJ SOLN
0.5000 mg | INTRAMUSCULAR | Status: DC | PRN
  Administered 2018-01-27: 0.5 mg via INTRAVENOUS
  Filled 2018-01-26: qty 1

## 2018-01-26 MED ORDER — THROMBIN 5000 UNITS EX SOLR
CUTANEOUS | Status: AC
  Filled 2018-01-26: qty 5000

## 2018-01-26 MED ORDER — DOCUSATE SODIUM 100 MG PO CAPS
100.0000 mg | ORAL_CAPSULE | Freq: Two times a day (BID) | ORAL | Status: DC
  Administered 2018-01-26 – 2018-01-29 (×7): 100 mg via ORAL
  Filled 2018-01-26 (×7): qty 1

## 2018-01-26 MED ORDER — VANCOMYCIN HCL IN DEXTROSE 1-5 GM/200ML-% IV SOLN
1000.0000 mg | Freq: Two times a day (BID) | INTRAVENOUS | Status: AC
  Administered 2018-01-26: 1000 mg via INTRAVENOUS
  Filled 2018-01-26: qty 200

## 2018-01-26 MED ORDER — OXYCODONE HCL 5 MG PO TABS: 5.0000 mg | ORAL_TABLET | Freq: Once | ORAL | Status: DC | PRN

## 2018-01-26 MED ORDER — TRANEXAMIC ACID-NACL 1000-0.7 MG/100ML-% IV SOLN
1000.0000 mg | INTRAVENOUS | Status: AC
  Administered 2018-01-26: 1000 mg via INTRAVENOUS
  Filled 2018-01-26: qty 100

## 2018-01-26 MED ORDER — SODIUM CHLORIDE 0.9 % IV SOLN
INTRAVENOUS | Status: DC | PRN
  Administered 2018-01-26: 30 ug/min via INTRAVENOUS

## 2018-01-26 MED ORDER — OXYCODONE HCL 5 MG PO TABS
5.0000 mg | ORAL_TABLET | ORAL | Status: DC | PRN
  Administered 2018-01-26 – 2018-01-29 (×9): 5 mg via ORAL
  Filled 2018-01-26 (×9): qty 1

## 2018-01-26 MED ORDER — FENTANYL CITRATE (PF) 100 MCG/2ML IJ SOLN
INTRAMUSCULAR | Status: DC | PRN
  Administered 2018-01-26: 50 ug via INTRAVENOUS

## 2018-01-26 MED ORDER — ONDANSETRON HCL 4 MG/2ML IJ SOLN
4.0000 mg | Freq: Four times a day (QID) | INTRAMUSCULAR | Status: DC | PRN
  Administered 2018-01-27: 4 mg via INTRAVENOUS
  Filled 2018-01-26: qty 2

## 2018-01-26 MED ORDER — BUPIVACAINE-EPINEPHRINE 0.25% -1:200000 IJ SOLN
INTRAMUSCULAR | Status: DC | PRN
  Administered 2018-01-26: 20 mL

## 2018-01-26 MED ORDER — METOCLOPRAMIDE HCL 5 MG/ML IJ SOLN: 5.0000 mg | Freq: Three times a day (TID) | INTRAMUSCULAR | Status: DC | PRN

## 2018-01-26 MED ORDER — BIOTIN 5 MG PO CAPS: 5.0000 mg | ORAL_CAPSULE | Freq: Two times a day (BID) | ORAL | Status: DC

## 2018-01-26 MED ORDER — PROMETHAZINE HCL 25 MG/ML IJ SOLN
6.2500 mg | INTRAMUSCULAR | Status: DC | PRN
  Administered 2018-01-26: 6.25 mg via INTRAVENOUS

## 2018-01-26 MED ORDER — METHIMAZOLE 5 MG PO TABS
5.0000 mg | ORAL_TABLET | Freq: Every day | ORAL | Status: DC
  Administered 2018-01-27 – 2018-01-29 (×3): 5 mg via ORAL
  Filled 2018-01-26 (×4): qty 1

## 2018-01-26 MED ORDER — GABAPENTIN 300 MG PO CAPS
300.0000 mg | ORAL_CAPSULE | Freq: Two times a day (BID) | ORAL | Status: DC
  Administered 2018-01-26 – 2018-01-29 (×6): 300 mg via ORAL
  Filled 2018-01-26 (×7): qty 1

## 2018-01-26 MED ORDER — PHENOL 1.4 % MT LIQD: 1.0000 | OROMUCOSAL | Status: DC | PRN

## 2018-01-26 MED ORDER — PROPOFOL 500 MG/50ML IV EMUL
INTRAVENOUS | Status: DC | PRN
  Administered 2018-01-26: 100 ug/kg/min via INTRAVENOUS

## 2018-01-26 MED ORDER — ONDANSETRON HCL 4 MG/2ML IJ SOLN
INTRAMUSCULAR | Status: AC
  Filled 2018-01-26: qty 2

## 2018-01-26 MED ORDER — SODIUM CHLORIDE 0.9 % IV SOLN
INTRAVENOUS | Status: DC
  Administered 2018-01-26: 11:00:00 via INTRAVENOUS

## 2018-01-26 MED ORDER — OXYCODONE HCL 5 MG/5ML PO SOLN: 5.0000 mg | Freq: Once | ORAL | Status: DC | PRN

## 2018-01-26 MED ORDER — FENTANYL CITRATE (PF) 100 MCG/2ML IJ SOLN
INTRAMUSCULAR | Status: AC
  Filled 2018-01-26: qty 2

## 2018-01-26 MED ORDER — MENTHOL 3 MG MT LOZG: 1.0000 | LOZENGE | OROMUCOSAL | Status: DC | PRN

## 2018-01-26 MED ORDER — ASPIRIN EC 325 MG PO TBEC
325.0000 mg | DELAYED_RELEASE_TABLET | Freq: Every day | ORAL | Status: DC
  Administered 2018-01-27 – 2018-01-29 (×3): 325 mg via ORAL
  Filled 2018-01-26 (×3): qty 1

## 2018-01-26 MED ORDER — FENTANYL CITRATE (PF) 250 MCG/5ML IJ SOLN
INTRAMUSCULAR | Status: AC
  Filled 2018-01-26: qty 5

## 2018-01-26 MED ORDER — BUPIVACAINE-EPINEPHRINE 0.25% -1:200000 IJ SOLN
INTRAMUSCULAR | Status: AC
  Filled 2018-01-26: qty 1

## 2018-01-26 MED ORDER — ROCURONIUM BROMIDE 10 MG/ML (PF) SYRINGE
PREFILLED_SYRINGE | INTRAVENOUS | Status: DC | PRN
  Administered 2018-01-26: 50 mg via INTRAVENOUS
  Administered 2018-01-26: 10 mg via INTRAVENOUS

## 2018-01-26 MED ORDER — PROPOFOL 10 MG/ML IV BOLUS
INTRAVENOUS | Status: DC | PRN
  Administered 2018-01-26 (×2): 20 mg via INTRAVENOUS

## 2018-01-26 MED ORDER — ACETAMINOPHEN 325 MG PO TABS
325.0000 mg | ORAL_TABLET | Freq: Four times a day (QID) | ORAL | Status: DC | PRN
  Administered 2018-01-28 (×2): 650 mg via ORAL
  Filled 2018-01-26 (×5): qty 2

## 2018-01-26 MED ORDER — THROMBIN 20000 UNITS EX KIT
PACK | CUTANEOUS | Status: DC | PRN
  Administered 2018-01-26: 20000 [IU] via TOPICAL

## 2018-01-26 MED ORDER — THROMBIN (RECOMBINANT) 20000 UNITS EX SOLR
CUTANEOUS | Status: AC
  Filled 2018-01-26: qty 20000

## 2018-01-26 MED ORDER — LACTATED RINGERS IV SOLN
INTRAVENOUS | Status: DC
  Administered 2018-01-26 (×2): via INTRAVENOUS

## 2018-01-26 MED ORDER — ATORVASTATIN CALCIUM 10 MG PO TABS
20.0000 mg | ORAL_TABLET | Freq: Every day | ORAL | Status: DC
  Administered 2018-01-26 – 2018-01-28 (×3): 20 mg via ORAL
  Filled 2018-01-26 (×3): qty 1

## 2018-01-26 MED ORDER — PHENYLEPHRINE 40 MCG/ML (10ML) SYRINGE FOR IV PUSH (FOR BLOOD PRESSURE SUPPORT)
PREFILLED_SYRINGE | INTRAVENOUS | Status: AC
  Filled 2018-01-26: qty 10

## 2018-01-26 MED ORDER — DEXAMETHASONE SODIUM PHOSPHATE 10 MG/ML IJ SOLN
INTRAMUSCULAR | Status: AC
  Filled 2018-01-26: qty 1

## 2018-01-26 MED ORDER — ROCURONIUM BROMIDE 50 MG/5ML IV SOSY
PREFILLED_SYRINGE | INTRAVENOUS | Status: AC
  Filled 2018-01-26: qty 10

## 2018-01-26 MED ORDER — SODIUM CHLORIDE 0.9 % IV SOLN
INTRAVENOUS | Status: DC
  Administered 2018-01-26: 500 mL via INTRAVENOUS

## 2018-01-26 MED ORDER — DEXAMETHASONE SODIUM PHOSPHATE 10 MG/ML IJ SOLN
INTRAMUSCULAR | Status: DC | PRN
  Administered 2018-01-26: 5 mg via INTRAVENOUS

## 2018-01-26 MED ORDER — PHENYLEPHRINE 40 MCG/ML (10ML) SYRINGE FOR IV PUSH (FOR BLOOD PRESSURE SUPPORT)
PREFILLED_SYRINGE | INTRAVENOUS | Status: DC | PRN
  Administered 2018-01-26 (×4): 80 ug via INTRAVENOUS
  Administered 2018-01-26: 120 ug via INTRAVENOUS
  Administered 2018-01-26: 80 ug via INTRAVENOUS

## 2018-01-26 MED ORDER — PROMETHAZINE HCL 25 MG/ML IJ SOLN
INTRAMUSCULAR | Status: AC
  Filled 2018-01-26: qty 1

## 2018-01-26 MED ORDER — CHLORHEXIDINE GLUCONATE 4 % EX LIQD: 60.0000 mL | Freq: Once | CUTANEOUS | Status: DC

## 2018-01-26 MED ORDER — ONDANSETRON HCL 4 MG/2ML IJ SOLN
INTRAMUSCULAR | Status: DC | PRN
  Administered 2018-01-26: 4 mg via INTRAVENOUS

## 2018-01-26 MED ORDER — LISINOPRIL 5 MG PO TABS
5.0000 mg | ORAL_TABLET | Freq: Every day | ORAL | Status: DC
  Administered 2018-01-27 – 2018-01-28 (×2): 5 mg via ORAL
  Filled 2018-01-26 (×2): qty 1

## 2018-01-26 MED ORDER — GLYCERIN (LAXATIVE) 2.1 G RE SUPP
1.0000 | RECTAL | Status: DC | PRN
  Administered 2018-01-29: 1 via RECTAL
  Filled 2018-01-26 (×2): qty 1

## 2018-01-26 MED ORDER — VANCOMYCIN HCL IN DEXTROSE 1-5 GM/200ML-% IV SOLN
1000.0000 mg | INTRAVENOUS | Status: AC
  Administered 2018-01-26: 1000 mg via INTRAVENOUS
  Filled 2018-01-26: qty 200

## 2018-01-26 MED ORDER — METOCLOPRAMIDE HCL 5 MG PO TABS: 5.0000 mg | ORAL_TABLET | Freq: Three times a day (TID) | ORAL | Status: DC | PRN

## 2018-01-26 MED ORDER — 0.9 % SODIUM CHLORIDE (POUR BTL) OPTIME
TOPICAL | Status: DC | PRN
  Administered 2018-01-26 (×2): 1000 mL

## 2018-01-26 MED ORDER — ONDANSETRON HCL 4 MG PO TABS
4.0000 mg | ORAL_TABLET | Freq: Four times a day (QID) | ORAL | Status: DC | PRN
  Administered 2018-01-28: 4 mg via ORAL
  Filled 2018-01-26: qty 1

## 2018-01-26 MED ORDER — PANTOPRAZOLE SODIUM 40 MG PO TBEC
40.0000 mg | DELAYED_RELEASE_TABLET | Freq: Every day | ORAL | Status: DC
  Administered 2018-01-27 – 2018-01-29 (×3): 40 mg via ORAL
  Filled 2018-01-26 (×4): qty 1

## 2018-01-26 MED ORDER — FENTANYL CITRATE (PF) 100 MCG/2ML IJ SOLN
25.0000 ug | INTRAMUSCULAR | Status: DC | PRN
  Administered 2018-01-26: 25 ug via INTRAVENOUS

## 2018-01-26 SURGICAL SUPPLY — 56 items
BENZOIN TINCTURE PRP APPL 2/3 (GAUZE/BANDAGES/DRESSINGS) ×3 IMPLANT
BLADE CLIPPER SURG (BLADE) IMPLANT
BLADE SAW SGTL 18X1.27X75 (BLADE) ×2 IMPLANT
BLADE SAW SGTL 18X1.27X75MM (BLADE) ×1
CELLS DAT CNTRL 66122 CELL SVR (MISCELLANEOUS) ×1 IMPLANT
CLOSURE WOUND 1/2 X4 (GAUZE/BANDAGES/DRESSINGS) ×1
COVER SURGICAL LIGHT HANDLE (MISCELLANEOUS) ×3 IMPLANT
COVER WAND RF STERILE (DRAPES) ×3 IMPLANT
DRAPE C-ARM 42X72 X-RAY (DRAPES) ×3 IMPLANT
DRAPE IMP U-DRAPE 54X76 (DRAPES) ×3 IMPLANT
DRAPE STERI IOBAN 125X83 (DRAPES) ×3 IMPLANT
DRAPE U-SHAPE 47X51 STRL (DRAPES) ×9 IMPLANT
DRSG MEPILEX BORDER 4X8 (GAUZE/BANDAGES/DRESSINGS) ×3 IMPLANT
DURAPREP 26ML APPLICATOR (WOUND CARE) ×3 IMPLANT
ELECT BLADE 4.0 EZ CLEAN MEGAD (MISCELLANEOUS)
ELECT CAUTERY BLADE 6.4 (BLADE) ×3 IMPLANT
ELECT REM PT RETURN 9FT ADLT (ELECTROSURGICAL) ×3
ELECTRODE BLDE 4.0 EZ CLN MEGD (MISCELLANEOUS) IMPLANT
ELECTRODE REM PT RTRN 9FT ADLT (ELECTROSURGICAL) ×1 IMPLANT
ELIMINATOR HOLE APEX DEPUY (Hips) ×3 IMPLANT
FACESHIELD WRAPAROUND (MASK) ×6 IMPLANT
FEM STEM 12/14 TAPER SZ 4 HIP (Orthopedic Implant) ×3 IMPLANT
FEMORAL STEM 12/14 TPR SZ4 HIP (Orthopedic Implant) ×1 IMPLANT
GLOVE BIOGEL PI IND STRL 8 (GLOVE) ×2 IMPLANT
GLOVE BIOGEL PI INDICATOR 8 (GLOVE) ×4
GLOVE ORTHO TXT STRL SZ7.5 (GLOVE) ×6 IMPLANT
GOWN STRL REUS W/ TWL LRG LVL3 (GOWN DISPOSABLE) ×1 IMPLANT
GOWN STRL REUS W/ TWL XL LVL3 (GOWN DISPOSABLE) ×1 IMPLANT
GOWN STRL REUS W/TWL 2XL LVL3 (GOWN DISPOSABLE) ×3 IMPLANT
GOWN STRL REUS W/TWL LRG LVL3 (GOWN DISPOSABLE) ×2
GOWN STRL REUS W/TWL XL LVL3 (GOWN DISPOSABLE) ×2
HEAD FEM STD 32X+5 STRL (Hips) ×3 IMPLANT
HOOD PEEL AWAY FLYTE STAYCOOL (MISCELLANEOUS) ×3 IMPLANT
KIT BASIN OR (CUSTOM PROCEDURE TRAY) ×3 IMPLANT
KIT TURNOVER KIT B (KITS) ×3 IMPLANT
LINER ACETABULAR 32X50 (Liner) ×3 IMPLANT
LINER PINN ACET GRIP 50X100 ×3 IMPLANT
MANIFOLD NEPTUNE II (INSTRUMENTS) ×3 IMPLANT
NS IRRIG 1000ML POUR BTL (IV SOLUTION) ×6 IMPLANT
PACK TOTAL JOINT (CUSTOM PROCEDURE TRAY) ×3 IMPLANT
PAD ARMBOARD 7.5X6 YLW CONV (MISCELLANEOUS) ×6 IMPLANT
RTRCTR WOUND ALEXIS 18CM MED (MISCELLANEOUS) ×3
SPONGE LAP 18X18 RF (DISPOSABLE) ×3 IMPLANT
STRIP CLOSURE SKIN 1/2X4 (GAUZE/BANDAGES/DRESSINGS) ×2 IMPLANT
SUT VIC AB 0 CT1 27 (SUTURE) ×2
SUT VIC AB 0 CT1 27XBRD ANBCTR (SUTURE) ×1 IMPLANT
SUT VIC AB 2-0 CT1 27 (SUTURE) ×2
SUT VIC AB 2-0 CT1 TAPERPNT 27 (SUTURE) ×1 IMPLANT
SUT VICRYL 4-0 PS2 18IN ABS (SUTURE) ×3 IMPLANT
SUT VLOC 180 0 24IN GS25 (SUTURE) ×6 IMPLANT
SYR CONTROL 10ML LL (SYRINGE) ×3 IMPLANT
TOWEL OR 17X24 6PK STRL BLUE (TOWEL DISPOSABLE) ×3 IMPLANT
TOWEL OR 17X26 10 PK STRL BLUE (TOWEL DISPOSABLE) ×6 IMPLANT
TRAY CATH 16FR W/PLASTIC CATH (SET/KITS/TRAYS/PACK) ×3 IMPLANT
TRAY FOLEY MTR SLVR 16FR STAT (SET/KITS/TRAYS/PACK) IMPLANT
WATER STERILE IRR 1000ML POUR (IV SOLUTION) ×3 IMPLANT

## 2018-01-26 NOTE — Interval H&P Note (Signed)
History and Physical Interval Note:  01/26/2018 7:27 AM  Christina Booker  has presented today for surgery, with the diagnosis of Osteoarthritis Right Hip  The various methods of treatment have been discussed with the patient and family. After consideration of risks, benefits and other options for treatment, the patient has consented to  Procedure(s): RIGHT TOTAL HIP ARTHROPLASTY ANTERIOR APPROACH (Right) as a surgical intervention .  The patient's history has been reviewed, patient examined, no change in status, stable for surgery.  I have reviewed the patient's chart and labs.  Questions were answered to the patient's satisfaction.     Marybelle Killings

## 2018-01-26 NOTE — Anesthesia Procedure Notes (Signed)
Procedure Name: Intubation Performed by: Milford Cage, CRNA Pre-anesthesia Checklist: Patient identified, Emergency Drugs available, Suction available and Patient being monitored Patient Re-evaluated:Patient Re-evaluated prior to induction Oxygen Delivery Method: Circle System Utilized Preoxygenation: Pre-oxygenation with 100% oxygen Induction Type: IV induction Ventilation: Mask ventilation without difficulty Laryngoscope Size: Glidescope and 3 Grade View: Grade I Tube type: Oral Number of attempts: 1 Airway Equipment and Method: Stylet Placement Confirmation: ETT inserted through vocal cords under direct vision,  positive ETCO2 and breath sounds checked- equal and bilateral Secured at: 21 cm Tube secured with: Tape Dental Injury: Teeth and Oropharynx as per pre-operative assessment

## 2018-01-26 NOTE — H&P (Signed)
Office Visit Note              Patient: Christina Booker                                        Date of Birth: 1940-07-18                                                    MRN: [id number] Visit Date: 12/20/2017                                                                     Requested by: Raina Mina., MD [address], [place] [address] PCP: Raina Mina., MD   Assessment & Plan: Visit Diagnoses:  1. Acute pain of right knee   2. Pain in right hip   3. S/P lumbar fusion   4. Unilateral primary osteoarthritis, right hip     Plan: Progressive right hip osteoarthritis with loss of joint space since December 2018 images with subchondral sclerosis and marginal osteophytes, subchondral cyst formation.  She is having significant pain not responsive to anti-inflammatories, gabapentin and Tylenol.  She is used a cane and at times a walker due to her severe hip pain.  When she turns overnight it wakes her up at night.  We discussed options of intra-articular injection versus total hip arthroplasty and she states she would like to proceed with total hip arthroplasty since her pain is progressed and at times she now rates it is severe and limits her ambulation.  Patient's husband is present today and included in the discussion.  We discussed risks of infection, femur fracture, revision surgery.  Options of spinal anesthesia was discussed.  Questions elicited and answered she understands request we proceed.  Follow-Up Instructions: No follow-ups on file.   Orders:     Orders Placed This Encounter  Procedures  . XR Pelvis 1-2 Views  . XR Knee 1-2 Views Right  . XR Lumbar Spine 2-3 Views   No orders of the defined types were placed in this encounter.     Procedures: No procedures performed   Clinical Data: No additional findings.   Subjective:    Chief Complaint  Patient presents with  . Lower Back - Pain  . Right Hip - Pain  . Right Knee - Pain     HPI 77 year old female returns with progressive right groin pain that radiates into her thigh down stops just above her knee.  She is had some episodes where she is fallen to the hip giving way.  She is taken Celebrex without relief Tylenol 6 tablets a day we discussed not exceeding the recommended dosage.  She has been amatory with a left hip limp.  She denies associated back pain with this.  Previous L4-5 fusion with Gill procedure 2016 and later L3-4 fusion September 2018 with good relief of claudication symptoms.  She gets relief from her hip with sitting she is been ambulating with a cane without relief.  Review of Systems positive for lumbar spine fusion L3-L5.  Cervical fusion C7-T1 solid.  Positive history of postoperative nausea and vomiting.  Previous cholecystectomy 2018 doing well.  Right hip osteoarthritis with progressive symptoms otherwise negative is a pertains HPI   Objective: Vital Signs: Ht 5' (1.524 m)   Wt 170 lb (77.1 kg)   BMI 33.20 kg/m   Physical Exam  Constitutional: She is oriented to person, place, and time. She appears well-developed.  HENT:  Head: Normocephalic.  Right Ear: External ear normal.  Left Ear: External ear normal.  Eyes: Pupils are equal, round, and reactive to light.  Neck: No tracheal deviation present. No thyromegaly present.  Cardiovascular: Normal rate.  Pulmonary/Chest: Effort normal.  Abdominal: Soft.  Neurological: She is alert and oriented to person, place, and time.  Skin: Skin is warm and dry.  Psychiatric: She has a normal mood and affect. Her behavior is normal.    Ortho Exam patient has well-healed lumbar incision.  No abductor hip flexion weakness.  She has 10 degrees internal rotation right hip with sharp severe pain which reproduces her symptoms external rotation 30 degrees.  She can cross her left leg over her right but not right over the left due to limitation of right hip range of motion.  No knee crepitus.  Distal  pulses are 2+.  Specialty Comments:  No specialty comments available.  Imaging: No results found.   PMFS History:     Patient Active Problem List   Diagnosis Date Noted  . PONV (postoperative nausea and vomiting) 11/21/2016  . Lumbar stenosis 11/21/2016  . History of fusion of cervical spine 10/11/2016  . Cervical spinal stenosis 09/07/2016  . Spinal stenosis of lumbar region with neurogenic claudication 06/01/2016  . Cervical radiculopathy 05/12/2016  . Spinal stenosis of cervical region 05/12/2016  . Cervicalgia 05/12/2016  . Ganglion of left hand 12/22/2014  . S/P lumbar fusion 04/16/2014       Past Medical History:  Diagnosis Date  . Arthritis    hands, back  . Cervical spinal stenosis   . Complication of anesthesia   . Constipation   . Headache   . Hot flashes   . Hyperlipidemia   . Hypertension   . Irritable bowel syndrome   . PONV (postoperative nausea and vomiting)   . Rosacea   . Stroke [facility]) 12/01/2009   tia         Family History  Problem Relation Age of Onset  . Cancer Mother   . Uterine cancer Mother   . Hypertension Mother   . Heart attack Father          Past Surgical History:  Procedure Laterality Date  . ABDOMINAL HYSTERECTOMY    . ANTERIOR CERVICAL DECOMP/DISCECTOMY FUSION N/A 09/07/2016   Procedure: C7-T1 Anterior Cervical Discectomy and Fusion, Allograft, Plate;  Surgeon: Marybelle Killings, MD;  Location: [place];  Service: Orthopedics;  Laterality: N/A;  . BACK SURGERY    . CARPAL TUNNEL RELEASE Right 2009  . CARPAL TUNNEL RELEASE Left   . CATARACT EXTRACTION, BILATERAL    . CERVICAL DISCECTOMY  09/07/2016   C7-T1 Anterior Cervical Discectomy and Fusion, Allograft, Plate (N/A)  . EYE SURGERY    . GANGLION CYST EXCISION Left 12/22/2014   Procedure: Excision Left Hand Ganglion cyst;  Surgeon: Marybelle Killings, MD;  Location: [place];  Service: Orthopedics;  Laterality: Left;  . HEMORRHOID  SURGERY    . TUBAL LIGATION  Social History        Occupational History  . Not on file  Tobacco Use  . Smoking status: Former Smoker    Last attempt to quit: 04/09/1992    Years since quitting: 25.7  . Smokeless tobacco: Never Used  Substance and Sexual Activity  . Alcohol use: Yes    Alcohol/week: 4.0 standard drinks    Types: 4 Glasses of wine per week    Comment: 4  . Drug use: No  . Sexual activity: Not on file

## 2018-01-26 NOTE — Transfer of Care (Addendum)
Immediate Anesthesia Transfer of Care Note  Patient: Christina Booker  Procedure(s) Performed: RIGHT TOTAL HIP ARTHROPLASTY ANTERIOR APPROACH (Right )  Patient Location: PACU  Anesthesia Type:General  Level of Consciousness: awake, alert  and oriented  Airway & Oxygen Therapy: Patient Spontanous Breathing and Patient connected to face mask oxygen  Post-op Assessment: Report given to RN and Post -op Vital signs reviewed and stable  Post vital signs: Reviewed and stable  Last Vitals:  Vitals Value Taken Time  BP 98/58 01/26/2018 10:09 AM  Temp    Pulse 79 01/26/2018 10:09 AM  Resp 13 01/26/2018 10:09 AM  SpO2 100 % 01/26/2018 10:09 AM  Vitals shown include unvalidated device data.  Last Pain:  Vitals:   01/26/18 0618  TempSrc:   PainSc: 5       Patients Stated Pain Goal: 2 (26/20/35 5974)  Complications: No apparent anesthesia complications

## 2018-01-26 NOTE — Op Note (Addendum)
Preop diagnosis: Right primary hip osteoarthritis  Postop diagnosis: Same  Procedure: Right total hip arthroplasty, direct anterior approach  Surgeon: Rodell Perna, MD  Assistant: Benjiman Core, PA-C medically necessary and present for the entire procedure  EBL: See anesthetic record  Anesthesia: Initial spinal later converted to general.  Implants:Depuy gription 61mm cup, Neutral poly liner,  #4 Duofix high offset, 32 metal ball plus 5 mm neck.  Procedure after induction of spinal anesthesia with the patient continued to have pain after skin incision she was converted to general anesthesia.  Patient been placed on the Hana table with Hana boots central post careful padding positioning 1015 drapes DuraPrep C arm was brought in to visualize the hip for prepping and draping make sure both hips were visualized leg lengths were equal.  Vancomycin was given preoperatively and timeout procedure was completed.  Direct anterior approach was made.  Skin protector was used fascia was split elevated with fingertip with Allis clamp.  Dull cobra placed medially over the top of the capsule.  Transverse bleeders were coagulated.  Anterior capsule was resected.  Dull cobra placed on each side of the neck and C-arm visualization with the oscillating saw for visualization of the neck for appropriate length neck cut.  Neck was cut completed with the osteotome to avoid damage to the greater trochanter.  Labrum was resected.  Diona Foley that was resected showed full-thickness wear through the weightbearing portion of the femoral head.  Acetabulum had degenerative changes in the cartilage as well grade III and IV chondromalacia.  Sequential reaming up to 52mm under C arm visualization.  The grip shin 50 mm no hole cup was selected and impacted under C-arm for appropriate abduction and cup version.  It seated down was secure.  Apex centralizer hole eliminator was twisted and was tight.  Neutral liner was without limp was inserted.   Posterior capsule was resected hydraulic hook was applied leg was taken out 210 degrees external rotation taken down and under an preparation of the femoral side was performed.  We progressed up to a #4 stem which gave a tight fit.  Initially regular neck was used and then followed by a high offset neck.  With +1.5 ball the neck length was restored and leg lengths were equal by fluoroscopic visualization but with external rotation at 80 degrees she will begin to sublux.  We switch this due to the excessive shuck and instability to a +5 neck length which made the patient a few millimeters long but gave good stability.  She could be extended to 45 degrees externally rotated to 90.  Permanent stem and ball were selected and implanted.  Calcar did not need to be playing.  There was some bleeding post only on the capsule which delayed closure hip had to be dislocated again and portion of the posterior capsule was coagulated.  Some thrombin was placed in the depth of the wound V lock closure 2-0 Vicryl and subtenons tissue skin staple closure postop dressing and transferred to recovery room.  Discussed with family that operative hip was slightly long however it was required in order to get good stability and if she had problems we could put 1/4 inch heel lift on her at the opposite heel.  Patient had previous instrumented lumbar spine fusion multilevel and hip stability was a priority with higher dislocation rate in patients who had instrumented spine fusions.  Patient tolerated procedure well transfer the care of room stable condition.

## 2018-01-27 ENCOUNTER — Encounter (HOSPITAL_COMMUNITY): Payer: Self-pay | Admitting: Orthopaedic Surgery

## 2018-01-27 LAB — CBC
HCT: 23.8 % — ABNORMAL LOW (ref 36.0–46.0)
Hemoglobin: 7.7 g/dL — ABNORMAL LOW (ref 12.0–15.0)
MCH: 29.3 pg (ref 26.0–34.0)
MCHC: 32.4 g/dL (ref 30.0–36.0)
MCV: 90.5 fL (ref 80.0–100.0)
PLATELETS: 149 10*3/uL — AB (ref 150–400)
RBC: 2.63 MIL/uL — ABNORMAL LOW (ref 3.87–5.11)
RDW: 13.2 % (ref 11.5–15.5)
WBC: 13.5 10*3/uL — ABNORMAL HIGH (ref 4.0–10.5)
nRBC: 0 % (ref 0.0–0.2)

## 2018-01-27 LAB — BASIC METABOLIC PANEL
Anion gap: 5 (ref 5–15)
BUN: 13 mg/dL (ref 8–23)
CALCIUM: 8 mg/dL — AB (ref 8.9–10.3)
CO2: 22 mmol/L (ref 22–32)
CREATININE: 0.86 mg/dL (ref 0.44–1.00)
Chloride: 109 mmol/L (ref 98–111)
GFR calc non Af Amer: >60 mL/min (ref >60)
GLUCOSE: 139 mg/dL — AB (ref 70–99)
Potassium: 4.1 mmol/L (ref 3.5–5.1)
Sodium: 136 mmol/L (ref 135–145)

## 2018-01-27 NOTE — Progress Notes (Signed)
   Subjective: 1 Day Post-Op Procedure(s) (LRB): RIGHT TOTAL HIP ARTHROPLASTY ANTERIOR APPROACH (Right) Patient reports pain as moderate.    Objective: Vital signs in last 24 hours: Temp:  [97.9 F (36.6 C)-98.9 F (37.2 C)] 97.9 F (36.6 C) (11/16 0352) Pulse Rate:  [71-88] 80 (11/16 0352) Resp:  [11-26] 15 (11/16 0352) BP: (79-118)/(50-93) 106/63 (11/16 0352) SpO2:  [90 %-100 %] 90 % (11/16 0352)  Intake/Output from previous day: 11/15 0701 - 11/16 0700 In: 2900 [I.V.:2500] Out: 600 [Blood:600] Intake/Output this shift: No intake/output data recorded.  Recent Labs    01/25/18 1440  HGB 13.4   Recent Labs    01/25/18 1440  WBC 8.8  RBC 4.79  HCT 44.3  PLT 251   Recent Labs    01/25/18 1440 01/27/18 0342  NA 138 136  K 3.9 4.1  CL 107 109  CO2 22 22  BUN 11 13  CREATININE 1.11* 0.86  GLUCOSE 100* 139*  CALCIUM 9.3 8.0*   No results for input(s): LABPT, INR in the last 72 hours.  Neurologically intact Dg C-arm 1-60 Min  Result Date: 01/26/2018 CLINICAL DATA:  Anterior approach right hip replacement. Reported fluoro time is 21 seconds EXAM: DG C-ARM 61-120 MIN; OPERATIVE RIGHT HIP WITH PELVIS COMPARISON:  AP view of the pelvis dated December 20, 2017 FINDINGS: The patient has undergone interval placement of a right hip joint prosthesis. Radiographic positioning of the prosthetic components is good. The interface with the native bone is normal. IMPRESSION: There is no immediate postprocedure complication following right hip joint prosthesis placement. Electronically Signed   By: David  Martinique M.D.   On: 01/26/2018 10:13   Dg Hip Port Unilat With Pelvis 1v Right  Result Date: 01/26/2018 CLINICAL DATA:  Status post total hip replacement on the right EXAM: DG HIP (WITH OR WITHOUT PELVIS) 1V PORT RIGHT COMPARISON:  Intraoperative right hip images January 26, 2018; frontal pelvis December 20, 2017 FINDINGS: Frontal pelvis image obtained. There is a total hip  replacement on the right with prosthetic components well-seated on frontal view. No acute fracture or dislocation. There is slight narrowing of the left hip joint. There is postoperative air and skin staples on the right laterally. Postoperative changes also noted in the lower lumbar spine. IMPRESSION: Status post total hip replacement right with prosthetic components well-seated on frontal view. No fracture or dislocation. Mild narrowing left hip joint. Postoperative change also noted in lower lumbar spine. Electronically Signed   By: Lowella Grip III M.D.   On: 01/26/2018 10:39   Dg Hip Operative Unilat With Pelvis Right  Result Date: 01/26/2018 CLINICAL DATA:  Anterior approach right hip replacement. Reported fluoro time is 21 seconds EXAM: DG C-ARM 61-120 MIN; OPERATIVE RIGHT HIP WITH PELVIS COMPARISON:  AP view of the pelvis dated December 20, 2017 FINDINGS: The patient has undergone interval placement of a right hip joint prosthesis. Radiographic positioning of the prosthetic components is good. The interface with the native bone is normal. IMPRESSION: There is no immediate postprocedure complication following right hip joint prosthesis placement. Electronically Signed   By: David  Martinique M.D.   On: 01/26/2018 10:13    Assessment/Plan: 1 Day Post-Op Procedure(s) (LRB): RIGHT TOTAL HIP ARTHROPLASTY ANTERIOR APPROACH (Right) Up with therapy. Dressing dry. AM CBC still pending. BP was low this AM. Possible Home Sunday or Monday depending on PT progress.   Christina Booker 01/27/2018, 8:42 AM

## 2018-01-27 NOTE — Care Management Note (Addendum)
Case Management Note  Patient Details  Name: Christina Booker MRN: [id number] Date of Birth: [birth year]  Subjective/Objective:    Pt from home with husband for THR.  Pt uses RW, 3n1, and shower seat at home regularly PTA.  Pt states husband is physically limited and will not be able to provide much help upon discharge.    Pt very anxious about d/c and hoping that maximum [facility] services will be provided.  Pt states she has tried to hire personal care services but has been unable to find one that will provide help for short-term recovery.   Pt states this surgery has been more painful than she anticipated and she is worried about the stress it will put on her husband to care for her. Also, she is worried about falling.    Action/Plan: Pt does not need any DME.    [facility] was set up for patient preoperatively.  Pt is agreeable to this agency and hopes for maximum support.  Pt will need HH orders for PT, Nurse's Aide, Social work (to help arrange personal care services), and any others deemed necessary by provider.    Expected Discharge Date:       01/29/18           Expected Discharge Plan:  [place]  In-House Referral:  NA  Discharge planning Services  CM Consult  Post Acute Care Choice:  Home Health Choice offered to:  Patient  DME Arranged:  N/A DME Agency:  NA  HH Arranged:  PT, Nurse's Aide, Social Work [employer] Agency:  [facility] [facility] [employer]  Status of Service:  In process, will continue to follow  If discussed at Long Length of Stay Meetings, dates discussed:    Additional Comments: 01/28/18- D/W [place] liaison.  [facility] will follow up to help arrange PCS.   Claudie Leach, RN 01/27/2018, 5:32 PM

## 2018-01-27 NOTE — Progress Notes (Signed)
Physical Therapy Treatment Patient Details Name: Christina Booker MRN: [id number] DOB: [birth year] Today's Date: 01/27/2018    History of Present Illness Patient is S/P right anterior THA on 01/26/2018. She has a 2-3 week history prior to the surgery of significant hip pain. PMH:HTN, CVA, Low back Sx, Cervical fusion, Arthritis    PT Comments    Patient increased her ambulation distance today but was more limited by pain with transfers this afternoon. She became SOB with sitting at the edge of the bed. It did resolve. She reported no sharp chest pain or syncope. Nursing will continue to monitor. She was able to ambulate afterwards.    Follow Up Recommendations  Follow surgeon's recommendation for DC plan and follow-up therapies     Equipment Recommendations  3in1 (PT)    Recommendations for Other Services Rehab consult     Precautions / Restrictions Precautions Precautions: Anterior Hip Precaution Booklet Issued: Yes (comment) Precaution Comments: exercises given  Restrictions Weight Bearing Restrictions: Yes RLE Weight Bearing: Weight bearing as tolerated    Mobility  Bed Mobility Overal bed mobility: Needs Assistance Bed Mobility: Supine to Sit     Supine to sit: Mod assist     General bed mobility comments: continued to need mod a to the edge of the bed. Had more pain this afternoon. Upon sitting the patient became short of breath. Sh ereported she flet like she had something in her throat. Nursing was called and came to monitor. She did improve. She requested to walk.   Transfers Overall transfer level: Needs assistance Equipment used: Rolling walker (2 wheeled) Transfers: Sit to/from Stand Sit to Stand: Min assist         General transfer comment: Min a for strength and inital standing balance. Mod cuing for hand placement when sitting and standing.   Ambulation/Gait Ambulation/Gait assistance: Min guard Gait Distance (Feet): 25 Feet Assistive device:  Rolling walker (2 wheeled) Gait Pattern/deviations: Step-to pattern;Decreased stance time - right Gait velocity: decreased   General Gait Details: despite inital shortness of breath patient was able to improve ambulation distance.    Stairs             Wheelchair Mobility    Modified Rankin (Stroke Patients Only)       Balance                                            Cognition Arousal/Alertness: Awake/alert Behavior During Therapy: WFL for tasks assessed/performed Overall Cognitive Status: Within Functional Limits for tasks assessed                                        Exercises      General Comments        Pertinent Vitals/Pain Pain Assessment: Faces Faces Pain Scale: Hurts whole lot Pain Location: right hip  Pain Descriptors / Indicators: Aching Pain Intervention(s): Limited activity within patient's tolerance    Home Living                      Prior Function            PT Goals (current goals can now be found in the care plan section) Acute Rehab PT Goals Patient Stated Goal: to go home  PT Goal  Formulation: With patient Time For Goal Achievement: 02/03/18 Potential to Achieve Goals: Good    Frequency    7X/week      PT Plan Current plan remains appropriate    Co-evaluation              AM-PAC PT "6 Clicks" Daily Activity  Outcome Measure  Difficulty turning over in bed (including adjusting bedclothes, sheets and blankets)?: A Lot Difficulty moving from lying on back to sitting on the side of the bed? : A Lot Difficulty sitting down on and standing up from a chair with arms (e.g., wheelchair, bedside commode, etc,.)?: A Lot Help needed moving to and from a bed to chair (including a wheelchair)?: A Lot Help needed walking in hospital room?: A Lot Help needed climbing 3-5 steps with a railing? : Total 6 Click Score: 11    End of Session Equipment Utilized During Treatment: Gait  belt Activity Tolerance: Patient limited by pain Patient left: in chair;with call bell/phone within reach Nurse Communication: Mobility status PT Visit Diagnosis: Pain;Muscle weakness (generalized) (M62.81);Other abnormalities of gait and mobility (R26.89) Pain - Right/Left: Right Pain - part of body: Hip     Time: 3532-9924 PT Time Calculation (min) (ACUTE ONLY): 20 min  Charges:  $Gait Training: 8-22 mins                       Carney Living PT DPT  01/27/2018, 4:01 PM

## 2018-01-27 NOTE — Plan of Care (Signed)

## 2018-01-27 NOTE — Evaluation (Signed)
Physical Therapy Evaluation Patient Details Name: Christina Booker MRN: [id number] DOB: 09/04/1940 Today's Date: 01/27/2018   History of Present Illness  Patient is S/P right anterior THA on 01/26/2018. She has a 2-3 week history prior to the surgery of significant hip pain. PMH:HTN, CVA, Low back Sx, Cervical fusion, Arthritis  Clinical Impression  Patient presents with expected limitations in right hip strength and movement. She ambulated 74' with min guard. She required mod assistance to get out of bed and min a to stand. She has 4 steps to go into her house. She will require stair training prior to discharge. She lives at home with her husband. She is not sure how much assistance he will be able to provide. Skilled acute therapy will continue to work with the patient.     Follow Up Recommendations Follow surgeon's recommendation for DC plan and follow-up therapies    Equipment Recommendations  3in1 (PT)    Recommendations for Other Services Rehab consult     Precautions / Restrictions Precautions Precautions: Anterior Hip Precaution Booklet Issued: Yes (comment) Precaution Comments: exercises given  Restrictions Weight Bearing Restrictions: Yes RLE Weight Bearing: Weight bearing as tolerated      Mobility  Bed Mobility Overal bed mobility: Needs Assistance Bed Mobility: Supine to Sit     Supine to sit: Mod assist     General bed mobility comments: Needed assistance to get her right LE to the edge of the bed and mod a to sit up  Transfers Overall transfer level: Needs assistance Equipment used: Rolling walker (2 wheeled) Transfers: Sit to/from Stand Sit to Stand: Min assist         General transfer comment: Min a for strength and inital standing balance. Mod cuing for hand placement when sitting and standing.   Ambulation/Gait Ambulation/Gait assistance: Min guard Gait Distance (Feet): 15 Feet Assistive device: Rolling walker (2 wheeled) Gait  Pattern/deviations: Step-to pattern Gait velocity: slow  Gait velocity interpretation: <1.31 ft/sec, indicative of household ambulator General Gait Details: Slow stpe to gait pattern. Minor increase in pain towards the end of ambualtion.   Stairs            Wheelchair Mobility    Modified Rankin (Stroke Patients Only)       Balance Overall balance assessment: Independent                                           Pertinent Vitals/Pain Pain Assessment: 0-10 Pain Score: 6  Pain Location: right hip  Pain Descriptors / Indicators: Aching Pain Intervention(s): Limited activity within patient's tolerance;Monitored during session;Premedicated before session;Ice applied    Home Living Family/patient expects to be discharged to:: Private residence Living Arrangements: Spouse/significant other Available Help at Discharge: Family Type of Home: House Home Access: Stairs to enter Entrance Stairs-Rails: Right Entrance Stairs-Number of Steps: 4 Home Layout: One level        Prior Function Level of Independence: Independent with assistive device(s)         Comments: For the past 3 weeks the patient has had to use a walker to walk 2nd to significant right hip pain     Hand Dominance   Dominant Hand: Right    Extremity/Trunk Assessment   Upper Extremity Assessment Upper Extremity Assessment: Overall WFL for tasks assessed    Lower Extremity Assessment Lower Extremity Assessment: RLE deficits/detail RLE Deficits / Details:  Limited active movement of the right hip  RLE: Unable to fully assess due to pain RLE Sensation: WNL RLE Coordination: WNL    Cervical / Trunk Assessment Cervical / Trunk Assessment: Kyphotic  Communication   Communication: No difficulties  Cognition Arousal/Alertness: Awake/alert Behavior During Therapy: WFL for tasks assessed/performed Overall Cognitive Status: Within Functional Limits for tasks assessed                                         General Comments General comments (skin integrity, edema, etc.): surgical incision covered with bandage. No unexpected edema    Exercises     Assessment/Plan    PT Assessment Patient needs continued PT services  PT Problem List Decreased strength;Decreased range of motion;Decreased activity tolerance;Decreased mobility;Decreased safety awareness;Pain;Decreased knowledge of use of DME       PT Treatment Interventions DME instruction;Gait training;Stair training;Functional mobility training;Therapeutic activities;Therapeutic exercise;Neuromuscular re-education;Patient/family education    PT Goals (Current goals can be found in the Care Plan section)  Acute Rehab PT Goals Patient Stated Goal: to go home  PT Goal Formulation: With patient Time For Goal Achievement: 02/03/18 Potential to Achieve Goals: Good    Frequency 7X/week   Barriers to discharge Decreased caregiver support Patient reports her husband is 75 and she is unsure how much assist he will be able to provide     Co-evaluation               AM-PAC PT "6 Clicks" Daily Activity  Outcome Measure Difficulty turning over in bed (including adjusting bedclothes, sheets and blankets)?: A Lot Difficulty moving from lying on back to sitting on the side of the bed? : A Lot Difficulty sitting down on and standing up from a chair with arms (e.g., wheelchair, bedside commode, etc,.)?: A Lot Help needed moving to and from a bed to chair (including a wheelchair)?: A Lot Help needed walking in hospital room?: A Lot Help needed climbing 3-5 steps with a railing? : Total 6 Click Score: 11    End of Session Equipment Utilized During Treatment: Gait belt Activity Tolerance: Patient limited by pain Patient left: in chair;with call bell/phone within reach Nurse Communication: Mobility status PT Visit Diagnosis: Pain;Muscle weakness (generalized) (M62.81);Other abnormalities of gait and  mobility (R26.89) Pain - Right/Left: Right Pain - part of body: Hip    Time: 1610-9604 PT Time Calculation (min) (ACUTE ONLY): 22 min   Charges:   PT Evaluation $PT Eval Low Complexity: 1 Low           Carney Living PT DPT  01/27/2018, 9:47 AM

## 2018-01-28 LAB — CBC
HCT: 22.8 % — ABNORMAL LOW (ref 36.0–46.0)
HEMOGLOBIN: 7 g/dL — AB (ref 12.0–15.0)
MCH: 27.9 pg (ref 26.0–34.0)
MCHC: 30.7 g/dL (ref 30.0–36.0)
MCV: 90.8 fL (ref 80.0–100.0)
Platelets: 155 10*3/uL (ref 150–400)
RBC: 2.51 MIL/uL — ABNORMAL LOW (ref 3.87–5.11)
RDW: 13.3 % (ref 11.5–15.5)
WBC: 11.3 10*3/uL — ABNORMAL HIGH (ref 4.0–10.5)
nRBC: 0 % (ref 0.0–0.2)

## 2018-01-28 LAB — PREPARE RBC (CROSSMATCH)

## 2018-01-28 MED ORDER — SODIUM CHLORIDE 0.9% IV SOLUTION: Freq: Once | INTRAVENOUS | Status: DC

## 2018-01-28 MED ORDER — FUROSEMIDE 10 MG/ML IJ SOLN
20.0000 mg | Freq: Once | INTRAMUSCULAR | Status: AC
  Administered 2018-01-28: 20 mg via INTRAVENOUS
  Filled 2018-01-28: qty 2

## 2018-01-28 NOTE — Progress Notes (Signed)
Acute blood loss anemia. 2 units PRBC's were ordered. Thigh normal tenderness, not tense. Dressing dry. Plan transfusion then continue therapy.

## 2018-01-28 NOTE — Progress Notes (Signed)
Subjective: 2 Days Post-Op Procedure(s) (LRB): RIGHT TOTAL HIP ARTHROPLASTY ANTERIOR APPROACH (Right) Patient reports pain as mild.  Feeling very weak and tired.  No chest pain/pressure/palpitations/sob.   Objective: Vital signs in last 24 hours: Temp:  [98.4 F (36.9 C)-98.9 F (37.2 C)] 98.6 F (37 C) (11/17 0356) Pulse Rate:  [81-90] 81 (11/17 0356) Resp:  [15-18] 15 (11/17 0356) BP: (98-119)/(55-66) 98/55 (11/17 0356) SpO2:  [90 %-97 %] 90 % (11/17 0356)  Intake/Output from previous day: 11/16 0701 - 11/17 0700 In: 5137.1 [P.O.:1560; I.V.:3577.1] Out: 1700 [Urine:1700] Intake/Output this shift: No intake/output data recorded.  Recent Labs    01/25/18 1440 01/27/18 0702 01/28/18 0323  HGB 13.4 7.7* 7.0*   Recent Labs    01/27/18 0702 01/28/18 0323  WBC 13.5* 11.3*  RBC 2.63* 2.51*  HCT 23.8* 22.8*  PLT 149* 155   Recent Labs    01/25/18 1440 01/27/18 0342  NA 138 136  K 3.9 4.1  CL 107 109  CO2 22 22  BUN 11 13  CREATININE 1.11* 0.86  GLUCOSE 100* 139*  CALCIUM 9.3 8.0*   No results for input(s): LABPT, INR in the last 72 hours.  Neurologically intact Neurovascular intact Sensation intact distally Intact pulses distally Dorsiflexion/Plantar flexion intact Incision: dressing C/D/I No cellulitis present Compartment soft    Assessment/Plan: 2 Days Post-Op Procedure(s) (LRB): RIGHT TOTAL HIP ARTHROPLASTY ANTERIOR APPROACH (Right) Advance diet Up with therapy Plan for discharge tomorrow  ABLA- has trended down to 7.0.  Patient symptomatic and will transfuse with 2 units prbc today WBAT RLE    Christina Booker 01/28/2018, 9:21 AM

## 2018-01-28 NOTE — Progress Notes (Signed)
Physical Therapy Treatment Patient Details Name: Christina Booker MRN: [id number] DOB: 06-15-1940 Today's Date: 01/28/2018    History of Present Illness Patient is S/P right anterior THA on 01/26/2018. She has a 2-3 week history prior to the surgery of significant hip pain. PMH:HTN, CVA, Low back Sx, Cervical fusion, Arthritis    PT Comments    Patient only appropraite for light rrange of motion and AAROM iin the bed. The patient does report improved pain with light PROM and improved pain overall. She is motivated to participate after she gets her transfusion. Acute PT will follow up in the afternoon if time permits after transfusion.   Follow Up Recommendations  Follow surgeon's recommendation for DC plan and follow-up therapies     Equipment Recommendations  3in1 (PT)    Recommendations for Other Services Rehab consult     Precautions / Restrictions Precautions Precautions: Anterior Hip Precaution Booklet Issued: Yes (comment) Precaution Comments: exercises given  Restrictions Weight Bearing Restrictions: Yes RLE Weight Bearing: Weight bearing as tolerated    Mobility  Bed Mobility               General bed mobility comments: not perfromed 2nd to low HCT and HGB   Transfers                    Ambulation/Gait                 Stairs             Wheelchair Mobility    Modified Rankin (Stroke Patients Only)       Balance                                            Cognition Arousal/Alertness: Awake/alert Behavior During Therapy: WFL for tasks assessed/performed Overall Cognitive Status: Within Functional Limits for tasks assessed                                        Exercises Total Joint Exercises Ankle Circles/Pumps: AROM;20 reps Quad Sets: 10 reps;AAROM Heel Slides: 10 reps;AAROM(with luight therapy assistance ) Hip ABduction/ADduction: PROM;15 reps Knee Flexion: PROM(Hip flexion in  pain free range )    General Comments        Pertinent Vitals/Pain Pain Assessment: Faces Faces Pain Scale: No hurt    Home Living                      Prior Function            PT Goals (current goals can now be found in the care plan section) Acute Rehab PT Goals PT Goal Formulation: With patient Time For Goal Achievement: 02/03/18 Potential to Achieve Goals: Good    Frequency    7X/week      PT Plan Current plan remains appropriate    Co-evaluation              AM-PAC PT "6 Clicks" Daily Activity  Outcome Measure  Difficulty turning over in bed (including adjusting bedclothes, sheets and blankets)?: A Lot Difficulty moving from lying on back to sitting on the side of the bed? : A Lot Difficulty sitting down on and standing up from a chair with arms (e.g., wheelchair, bedside commode, etc,.)?: A  Lot Help needed moving to and from a bed to chair (including a wheelchair)?: A Lot Help needed walking in hospital room?: A Lot Help needed climbing 3-5 steps with a railing? : Total 6 Click Score: 11    End of Session   Activity Tolerance: Patient limited by fatigue Patient left: in bed;with call bell/phone within reach Nurse Communication: Mobility status PT Visit Diagnosis: Pain;Muscle weakness (generalized) (M62.81);Other abnormalities of gait and mobility (R26.89) Pain - Right/Left: Right Pain - part of body: Hip     Time: 6629-4765 PT Time Calculation (min) (ACUTE ONLY): 10 min  Charges:  $Therapeutic Exercise: 8-22 mins                        Carney Living PT DPT  01/28/2018, 10:58 AM

## 2018-01-29 LAB — TYPE AND SCREEN
ABO/RH(D): A POS
Antibody Screen: NEGATIVE
UNIT DIVISION: 0
Unit division: 0

## 2018-01-29 LAB — CBC
HCT: 31.3 % — ABNORMAL LOW (ref 36.0–46.0)
Hemoglobin: 10.3 g/dL — ABNORMAL LOW (ref 12.0–15.0)
MCH: 28.6 pg (ref 26.0–34.0)
MCHC: 32.9 g/dL (ref 30.0–36.0)
MCV: 86.9 fL (ref 80.0–100.0)
PLATELETS: 171 10*3/uL (ref 150–400)
RBC: 3.6 MIL/uL — ABNORMAL LOW (ref 3.87–5.11)
RDW: 13.9 % (ref 11.5–15.5)
WBC: 11.4 10*3/uL — ABNORMAL HIGH (ref 4.0–10.5)
nRBC: 0 % (ref 0.0–0.2)

## 2018-01-29 LAB — BPAM RBC
BLOOD PRODUCT EXPIRATION DATE: 201912112359
Blood Product Expiration Date: 201911222359
ISSUE DATE / TIME: 201911171514
ISSUE DATE / TIME: 201911172252
UNIT TYPE AND RH: 6200
Unit Type and Rh: 600

## 2018-01-29 MED ORDER — ASPIRIN 325 MG PO TBEC: 325.0000 mg | DELAYED_RELEASE_TABLET | Freq: Every day | ORAL | 0 refills | Status: AC

## 2018-01-29 MED ORDER — OXYCODONE-ACETAMINOPHEN 5-325 MG PO TABS: 1.0000 | ORAL_TABLET | ORAL | 0 refills | Status: AC | PRN

## 2018-01-29 MED FILL — Thrombin (Recombinant) For Soln 20000 Unit: CUTANEOUS | Qty: 1 | Status: AC

## 2018-01-29 NOTE — Plan of Care (Signed)
  Problem: Education: Goal: Knowledge of General Education information will improve Description Including pain rating scale, medication(s)/side effects and non-pharmacologic comfort measures Outcome: Progressing   Problem: Clinical Measurements: Goal: Ability to maintain clinical measurements within normal limits will improve Outcome: Progressing Goal: Will remain free from infection Outcome: Progressing   Problem: Activity: Goal: Risk for activity intolerance will decrease Outcome: Progressing   Problem: Nutrition: Goal: Adequate nutrition will be maintained Outcome: Progressing   Problem: Elimination: Goal: Will not experience complications related to bowel motility Outcome: Progressing Goal: Will not experience complications related to urinary retention Outcome: Progressing   Problem: Pain Managment: Goal: General experience of comfort will improve Outcome: Progressing   Problem: Safety: Goal: Ability to remain free from injury will improve Outcome: Progressing   Problem: Skin Integrity: Goal: Risk for impaired skin integrity will decrease Outcome: Progressing

## 2018-01-29 NOTE — Progress Notes (Signed)
Patient request to be a DNR. A yellow DNR sheet is placed in her chart and a DNR bracelet is applied. Patient also request to try and get home health for a few weeks. States that her husband is old and age and does not have the ability to help her much around the house.

## 2018-01-29 NOTE — Progress Notes (Signed)
   Subjective: 3 Days Post-Op Procedure(s) (LRB): RIGHT TOTAL HIP ARTHROPLASTY ANTERIOR APPROACH (Right) Patient reports pain as moderate.  Pain less than yesterday. Walked in hall with therapist.   Objective: Vital signs in last 24 hours: Temp:  [98.3 F (36.8 C)-99.7 F (37.6 C)] 98.3 F (36.8 C) (11/18 1339) Pulse Rate:  [76-87] 79 (11/18 1339) Resp:  [15-18] 18 (11/18 1339) BP: (100-122)/(54-71) 118/67 (11/18 1339) SpO2:  [95 %-100 %] 99 % (11/18 1339)  Intake/Output from previous day: 11/17 0701 - 11/18 0700 In: 1895 [P.O.:960; I.V.:935] Out: 1150 [Urine:1150] Intake/Output this shift: Total I/O In: 600 [P.O.:600] Out: -   Recent Labs    01/27/18 0702 01/28/18 0323 01/29/18 0302  HGB 7.7* 7.0* 10.3*   Recent Labs    01/28/18 0323 01/29/18 0302  WBC 11.3* 11.4*  RBC 2.51* 3.60*  HCT 22.8* 31.3*  PLT 155 171   Recent Labs    01/27/18 0342  NA 136  K 4.1  CL 109  CO2 22  BUN 13  CREATININE 0.86  GLUCOSE 139*  CALCIUM 8.0*   No results for input(s): LABPT, INR in the last 72 hours.  Neurologically intact No results found.  Assessment/Plan: 3 Days Post-Op Procedure(s) (LRB): RIGHT TOTAL HIP ARTHROPLASTY ANTERIOR APPROACH (Right) Acute blood loss anemia. 2 units PRBC's given . Ambulating in hall with therapy. Office one to 2 wks.   Marybelle Killings 01/29/2018, 3:18 PM

## 2018-01-29 NOTE — Progress Notes (Signed)
Physical Therapy Treatment Patient Details Name: Christina Booker MRN: [id number] DOB: 01/19/1941 Today's Date: 01/29/2018    History of Present Illness Patient is S/P right direct anterior THA on 01/26/2018. She has a 2-3 week history prior to the surgery of significant hip pain. PMH:HTN, CVA, Low back Sx, Cervical fusion, Arthritis    PT Comments    Patient seen for mobility progression. This session focused on LE strengthening/ROM exercises and review of HEP handout. Pt tolerated session well and requires assistance for completion of exercises. Continue to progress as tolerated.    Follow Up Recommendations  Follow surgeon's recommendation for DC plan and follow-up therapies     Equipment Recommendations  3in1 (PT);Other (comment);Rolling walker with 5" wheels(youth sized walker )    Recommendations for Other Services       Precautions / Restrictions Precautions Precautions: Fall Precaution Comments: direct anterior- no precautions Restrictions Weight Bearing Restrictions: Yes RLE Weight Bearing: Weight bearing as tolerated    Mobility  Bed Mobility                  Transfers                    Ambulation/Gait                 Stairs         General stair comments: verbally reviewed stair training    Wheelchair Mobility    Modified Rankin (Stroke Patients Only)       Balance                                            Cognition Arousal/Alertness: Awake/alert Behavior During Therapy: WFL for tasks assessed/performed Overall Cognitive Status: Within Functional Limits for tasks assessed                                        Exercises Total Joint Exercises Ankle Circles/Pumps: AROM;20 reps Quad Sets: Strengthening;Both;10 reps;Supine Short Arc Quad: AAROM;Right;Strengthening;10 reps;Supine Heel Slides: AAROM;Right;Strengthening;10 reps;Supine Hip ABduction/ADduction: AAROM;Right;10  reps;Supine    General Comments General comments (skin integrity, edema, etc.): HEP handout reviewed and pt educated on ambulation schedule      Pertinent Vitals/Pain Pain Assessment: Faces Faces Pain Scale: Hurts little more Pain Location: right hip with ROM Pain Descriptors / Indicators: Grimacing;Sore Pain Intervention(s): Limited activity within patient's tolerance;Monitored during session;Repositioned    Home Living                      Prior Function            PT Goals (current goals can now be found in the care plan section) Progress towards PT goals: Progressing toward goals    Frequency    7X/week      PT Plan Current plan remains appropriate    Co-evaluation              AM-PAC PT "6 Clicks" Daily Activity  Outcome Measure  Difficulty turning over in bed (including adjusting bedclothes, sheets and blankets)?: Unable Difficulty moving from lying on back to sitting on the side of the bed? : Unable Difficulty sitting down on and standing up from a chair with arms (e.g., wheelchair, bedside commode, etc,.)?: Unable Help needed moving to  and from a bed to chair (including a wheelchair)?: A Little Help needed walking in hospital room?: A Little Help needed climbing 3-5 steps with a railing? : A Little 6 Click Score: 12    End of Session   Activity Tolerance: Patient tolerated treatment well Patient left: with call bell/phone within reach;in bed Nurse Communication: Mobility status PT Visit Diagnosis: Pain;Muscle weakness (generalized) (M62.81);Other abnormalities of gait and mobility (R26.89) Pain - Right/Left: Right Pain - part of body: Hip     Time: 1430-1455 PT Time Calculation (min) (ACUTE ONLY): 25 min  Charges:  $Therapeutic Exercise: 23-37 mins                     Earney Navy, PTA Acute Rehabilitation Services Pager: [phone] Office: [phone]     Darliss Cheney 01/29/2018, 3:02 PM

## 2018-01-29 NOTE — Anesthesia Postprocedure Evaluation (Signed)
Anesthesia Post Note  Patient: [name]  Procedure(s) Performed: RIGHT TOTAL HIP ARTHROPLASTY ANTERIOR APPROACH (Right )     Patient location during evaluation: PACU Anesthesia Type: Combined General/Spinal Level of consciousness: awake and alert Pain management: pain level controlled Vital Signs Assessment: post-procedure vital signs reviewed and stable Respiratory status: spontaneous breathing, nonlabored ventilation and respiratory function stable Cardiovascular status: blood pressure returned to baseline and stable Postop Assessment: no apparent nausea or vomiting, no headache and no backache Anesthetic complications: no    Last Vitals:  Vitals:   01/28/18 2330 01/29/18 0330  BP: 114/61 (!) 101/59  Pulse: 87 80  Resp: 17 15  Temp: 37.3 C 37.1 C  SpO2: 97% 95%    Last Pain:  Vitals:   01/29/18 0400  TempSrc:   PainSc: Asleep   Pain Goal: Patients Stated Pain Goal: 2 (01/26/18 2114)               Lidia Collum

## 2018-01-29 NOTE — Progress Notes (Signed)
Physical Therapy Treatment Patient Details Name: Christina Booker MRN: [id number] DOB: 01-21-1941 Today's Date: 01/29/2018    History of Present Illness Patient is S/P right direct anterior THA on 01/26/2018. She has a 2-3 week history prior to the surgery of significant hip pain. PMH:HTN, CVA, Low back Sx, Cervical fusion, Arthritis    PT Comments    Patient is making progress toward Pt goals. Pt is able to ambulate 150 ft with seated rest break due to bilat UE fatigue and able to safely ascend/descend stairs simulating home entrance. Overall pt requires min guard assist.  Continue to progress as tolerated.    Follow Up Recommendations  Follow surgeon's recommendation for DC plan and follow-up therapies     Equipment Recommendations  3in1 (PT);Other (comment);Rolling walker with 5" wheels(youth sized walker )    Recommendations for Other Services       Precautions / Restrictions Precautions Precautions: Fall Precaution Comments: direct anterior- no precautions Restrictions Weight Bearing Restrictions: Yes RLE Weight Bearing: Weight bearing as tolerated    Mobility  Bed Mobility Overal bed mobility: Needs Assistance Bed Mobility: Supine to Sit     Supine to sit: Min guard     General bed mobility comments: use of rail and increased time and effort; no physical assist needed; cues for sequencing   Transfers Overall transfer level: Needs assistance Equipment used: Rolling walker (2 wheeled) Transfers: Sit to/from Stand Sit to Stand: Min guard         General transfer comment: cues for safe hand placement; assist to steady RW   Ambulation/Gait Ambulation/Gait assistance: Min guard Gait Distance (Feet): 150 Feet Assistive device: Rolling walker (2 wheeled) Gait Pattern/deviations: Step-through pattern;Decreased stance time - right;Decreased step length - left;Decreased dorsiflexion - right;Decreased weight shift to right;Antalgic Gait velocity: decreased    General Gait Details: one seated rest break due to bilat UE fatigue; cues for posture, safe proximity to RW, and R heel strike/foot flat   Stairs Stairs: Yes Stairs assistance: Min guard Stair Management: Two rails;Step to pattern;Forwards Number of Stairs: 2 General stair comments: cues for sequencing and technique    Wheelchair Mobility    Modified Rankin (Stroke Patients Only)       Balance Overall balance assessment: Needs assistance   Sitting balance-Leahy Scale: Good     Standing balance support: Bilateral upper extremity supported;During functional activity Standing balance-Leahy Scale: Poor                              Cognition Arousal/Alertness: Awake/alert Behavior During Therapy: WFL for tasks assessed/performed Overall Cognitive Status: Within Functional Limits for tasks assessed                                        Exercises      General Comments        Pertinent Vitals/Pain Pain Assessment: Faces Faces Pain Scale: Hurts little more    Home Living                      Prior Function            PT Goals (current goals can now be found in the care plan section)      Frequency    7X/week      PT Plan Current plan remains appropriate    Co-evaluation  AM-PAC PT "6 Clicks" Daily Activity  Outcome Measure  Difficulty turning over in bed (including adjusting bedclothes, sheets and blankets)?: Unable Difficulty moving from lying on back to sitting on the side of the bed? : Unable Difficulty sitting down on and standing up from a chair with arms (e.g., wheelchair, bedside commode, etc,.)?: Unable Help needed moving to and from a bed to chair (including a wheelchair)?: A Little Help needed walking in hospital room?: A Little Help needed climbing 3-5 steps with a railing? : A Little 6 Click Score: 12    End of Session Equipment Utilized During Treatment: Gait belt Activity  Tolerance: Patient tolerated treatment well Patient left: with call bell/phone within reach;Other (comment)(pt in bathroom attempting to have BM-RN aware) Nurse Communication: Mobility status PT Visit Diagnosis: Pain;Muscle weakness (generalized) (M62.81);Other abnormalities of gait and mobility (R26.89) Pain - Right/Left: Right Pain - part of body: Hip     Time: 6546-5035 PT Time Calculation (min) (ACUTE ONLY): 35 min  Charges:  $Gait Training: 23-37 mins                     Earney Navy, PTA Acute Rehabilitation Services Pager: [phone] Office: [phone]     Darliss Cheney 01/29/2018, 11:01 AM

## 2018-01-29 NOTE — Progress Notes (Signed)
Discharge instructions completed with pt.  Pt verbalized understanding of the information.  Pt denies chest pain, shortness of breath, dizziness, lightheadedness, and n/v.  Pt's IV discontinued.  Pt discharged home.  

## 2018-01-29 NOTE — Discharge Instructions (Signed)
Walk daily with walker. See Dr. Lorin Mercy in one to two weeks. OK to shower leave dressing on.

## 2018-01-31 ENCOUNTER — Telehealth (INDEPENDENT_AMBULATORY_CARE_PROVIDER_SITE_OTHER): Payer: Self-pay | Admitting: Orthopaedic Surgery

## 2018-01-31 DIAGNOSIS — Z87891 Personal history of nicotine dependence: Secondary | ICD-10-CM | POA: Diagnosis not present

## 2018-01-31 DIAGNOSIS — M5412 Radiculopathy, cervical region: Secondary | ICD-10-CM | POA: Diagnosis not present

## 2018-01-31 DIAGNOSIS — M67442 Ganglion, left hand: Secondary | ICD-10-CM | POA: Diagnosis not present

## 2018-01-31 DIAGNOSIS — Z8673 Personal history of transient ischemic attack (TIA), and cerebral infarction without residual deficits: Secondary | ICD-10-CM | POA: Diagnosis not present

## 2018-01-31 DIAGNOSIS — Z9181 History of falling: Secondary | ICD-10-CM | POA: Diagnosis not present

## 2018-01-31 DIAGNOSIS — I10 Essential (primary) hypertension: Secondary | ICD-10-CM | POA: Diagnosis not present

## 2018-01-31 DIAGNOSIS — Z471 Aftercare following joint replacement surgery: Secondary | ICD-10-CM | POA: Diagnosis not present

## 2018-01-31 DIAGNOSIS — M4802 Spinal stenosis, cervical region: Secondary | ICD-10-CM | POA: Diagnosis not present

## 2018-01-31 DIAGNOSIS — Z96641 Presence of right artificial hip joint: Secondary | ICD-10-CM | POA: Diagnosis not present

## 2018-01-31 DIAGNOSIS — Z7982 Long term (current) use of aspirin: Secondary | ICD-10-CM | POA: Diagnosis not present

## 2018-01-31 DIAGNOSIS — M48062 Spinal stenosis, lumbar region with neurogenic claudication: Secondary | ICD-10-CM | POA: Diagnosis not present

## 2018-01-31 NOTE — Telephone Encounter (Signed)
Patient states Oxycodone is making her sick.  She called her PCP who told her that she could discontinue the Oxycodone and go back to her regular regimen of Tylenol, Gabapentin, and Celebrex. She will wait to start the Celebrex tomorrow.  FYI

## 2018-01-31 NOTE — Telephone Encounter (Signed)
Patient left a voicemail wanting to know what medication can she take other than oxycodone? Please advise # [phone]

## 2018-01-31 NOTE — Telephone Encounter (Signed)
OK 

## 2018-02-01 NOTE — Discharge Summary (Addendum)
Patient ID: Christina Booker MRN: [id number] DOB/AGE: 14-Jun-1940 77 y.o.  Admit date: 01/26/2018 Discharge date: 02/01/2018  Admission Diagnoses:  Active Problems:   Unilateral primary osteoarthritis, right hip   Arthritis of right hip   Discharge Diagnoses:  Active Problems:   Unilateral primary osteoarthritis, right hip   Arthritis of right hip  status post Procedure(s): RIGHT TOTAL HIP ARTHROPLASTY ANTERIOR APPROACH Acute blood loss anemia  Treated with transfusion.  Past Medical History:  Diagnosis Date  . Arthritis    hands, back  . Arthritis of right hip   . Cervical spinal stenosis   . Complication of anesthesia   . Constipation   . Headache   . Hot flashes   . Hyperlipidemia   . Hypertension   . Hyperthyroidism    current problem  . Irritable bowel syndrome   . PAF (paroxysmal atrial fibrillation) ([place])    declined asnticoagulation  . PONV (postoperative nausea and vomiting)   . Rosacea   . Stroke [facility]) 12/01/2009   tia  2010    Surgeries: Procedure(s): RIGHT TOTAL HIP ARTHROPLASTY ANTERIOR APPROACH on 01/26/2018   Consultants:   Discharged Condition: Improved  Hospital Course: Christina Booker is an 77 y.o. female who was admitted 01/26/2018 for operative treatment of right hip arthritis. Patient failed conservative treatments (please see the history and physical for the specifics) and had severe unremitting pain that affects sleep, daily activities and work/hobbies. After pre-op clearance, the patient was taken to the operating room on 01/26/2018 and underwent  Procedure(s): RIGHT TOTAL HIP ARTHROPLASTY ANTERIOR APPROACH.    Patient was given perioperative antibiotics:  Anti-infectives (From admission, onward)   Start     Dose/Rate Route Frequency Ordered Stop   01/26/18 1415  vancomycin (VANCOCIN) IVPB 1000 mg/200 mL premix     1,000 mg 200 mL/hr over 60 Minutes Intravenous Every 12 hours 01/26/18 1400 01/26/18 1658   01/26/18 0600  vancomycin  (VANCOCIN) IVPB 1000 mg/200 mL premix     1,000 mg 200 mL/hr over 60 Minutes Intravenous On call to O.R. 01/26/18 0556 01/26/18 0800       Patient was given sequential compression devices and early ambulation to prevent DVT.   Patient benefited maximally from hospital stay and there were no complications. At the time of discharge, the patient was urinating/moving their bowels without difficulty, tolerating a regular diet, pain is controlled with oral pain medications and they have been cleared by PT/OT.   Recent vital signs: No data found.   Recent laboratory studies: No results for input(s): WBC, HGB, HCT, PLT, NA, K, CL, CO2, BUN, CREATININE, GLUCOSE, INR, CALCIUM in the last 72 hours.  Invalid input(s): PT, 2   Discharge Medications:   Allergies as of 01/29/2018      Reactions   Penicillins Rash, Other (See Comments)   Has patient had a PCN reaction causing immediate rash, facial/tongue/throat swelling, SOB or lightheadedness with hypotension: Yes Has patient had a PCN reaction causing severe rash involving mucus membranes or skin necrosis: No Has patient had a PCN reaction that required hospitalization: No Has patient had a PCN reaction occurring within the last 10 years: No If all of the above answers are "NO", then may proceed with Cephalosporin use.   Methocarbamol Nausea Only      Medication List    STOP taking these medications   acetaminophen 500 MG tablet Commonly known as:  TYLENOL   celecoxib 200 MG capsule Commonly known as:  CELEBREX   HYDROcodone-acetaminophen  5-325 MG tablet Commonly known as:  NORCO/VICODIN   promethazine 12.5 MG tablet Commonly known as:  PHENERGAN   traMADol 50 MG tablet Commonly known as:  ULTRAM     TAKE these medications   aspirin 325 MG EC tablet Take 1 tablet (325 mg total) by mouth daily with breakfast. What changed:    medication strength  how much to take  when to take this   atorvastatin 20 MG tablet Commonly  known as:  LIPITOR Take 20 mg by mouth at bedtime.   BENEFIBER PO Take 15 mLs by mouth daily.   Biotin 5 MG Caps Take 5 mg by mouth 2 (two) times daily.   buPROPion 150 MG 24 hr tablet Commonly known as:  WELLBUTRIN XL Take 150 mg by mouth every morning.   diphenhydrAMINE 25 mg capsule Commonly known as:  BENADRYL Take 50 mg by mouth at bedtime.   gabapentin 300 MG capsule Commonly known as:  NEURONTIN Take 300 mg by mouth 2 (two) times daily.   glycerin adult 2 g suppository Place 1 suppository rectally as needed for constipation.   lisinopril 5 MG tablet Commonly known as:  PRINIVIL,ZESTRIL Take 5 mg by mouth at bedtime.   methimazole 5 MG tablet Commonly known as:  TAPAZOLE Take 5 mg by mouth daily.   oxyCODONE-acetaminophen 5-325 MG tablet Commonly known as:  PERCOCET/ROXICET Take 1-2 tablets by mouth every 4 (four) hours as needed for severe pain.   pantoprazole 40 MG tablet Commonly known as:  PROTONIX Take 40 mg by mouth daily.   polyethylene glycol powder powder Commonly known as:  GLYCOLAX/MIRALAX Take 2.125-4.25 g by mouth daily as needed for moderate constipation.   Turmeric Curcumin 500 MG Caps Take 500 mg by mouth daily.   Vitamin D3 25 MCG (1000 UT) Caps Take 1,000 Units by mouth 2 (two) times daily.       Diagnostic Studies: Dg Chest 2 View  Result Date: 01/26/2018 CLINICAL DATA:  Shortness of breath, history of hypertension, stroke, AFib, preop evaluation for right total hip arthroplasty EXAM: CHEST - 2 VIEW COMPARISON:  11/25/2016 FINDINGS: The heart size and mediastinal contours are within normal limits. Both lungs are clear. The visualized skeletal structures are unremarkable. Lower cervical fusion hardware noted. Degenerative changes of the spine. Previous cholecystectomy. Partial imaging of the lumbar fusion hardware posteriorly. IMPRESSION: No active cardiopulmonary disease. Electronically Signed   By: Jerilynn Mages.  Shick M.D.   On: 01/26/2018 08:34    Dg C-arm 1-60 Min  Result Date: 01/26/2018 CLINICAL DATA:  Anterior approach right hip replacement. Reported fluoro time is 21 seconds EXAM: DG C-ARM 61-120 MIN; OPERATIVE RIGHT HIP WITH PELVIS COMPARISON:  AP view of the pelvis dated December 20, 2017 FINDINGS: The patient has undergone interval placement of a right hip joint prosthesis. Radiographic positioning of the prosthetic components is good. The interface with the native bone is normal. IMPRESSION: There is no immediate postprocedure complication following right hip joint prosthesis placement. Electronically Signed   By: David  Martinique M.D.   On: 01/26/2018 10:13   Dg Hip Port Unilat With Pelvis 1v Right  Result Date: 01/26/2018 CLINICAL DATA:  Status post total hip replacement on the right EXAM: DG HIP (WITH OR WITHOUT PELVIS) 1V PORT RIGHT COMPARISON:  Intraoperative right hip images January 26, 2018; frontal pelvis December 20, 2017 FINDINGS: Frontal pelvis image obtained. There is a total hip replacement on the right with prosthetic components well-seated on frontal view. No acute fracture or dislocation. There  is slight narrowing of the left hip joint. There is postoperative air and skin staples on the right laterally. Postoperative changes also noted in the lower lumbar spine. IMPRESSION: Status post total hip replacement right with prosthetic components well-seated on frontal view. No fracture or dislocation. Mild narrowing left hip joint. Postoperative change also noted in lower lumbar spine. Electronically Signed   By: Lowella Grip III M.D.   On: 01/26/2018 10:39   Dg Hip Operative Unilat With Pelvis Right  Result Date: 01/26/2018 CLINICAL DATA:  Anterior approach right hip replacement. Reported fluoro time is 21 seconds EXAM: DG C-ARM 61-120 MIN; OPERATIVE RIGHT HIP WITH PELVIS COMPARISON:  AP view of the pelvis dated December 20, 2017 FINDINGS: The patient has undergone interval placement of a right hip joint prosthesis.  Radiographic positioning of the prosthetic components is good. The interface with the native bone is normal. IMPRESSION: There is no immediate postprocedure complication following right hip joint prosthesis placement. Electronically Signed   By: David  Martinique M.D.   On: 01/26/2018 10:13      Follow-up Information    Marybelle Killings, MD Follow up in 1 week(s).   Specialty:  Orthopedic Surgery Why:  see Dr. Lorin Mercy in one to two weeks.  Contact information: [place] [address] [phone]        [facility] Follow up.   Specialty:  [place] Why:  A representative from [facility] will contact you to arrange start date and time for your services.  Contact information: [address] [place] [address] [phone]           Discharge Plan:  discharge to home Disposition:     Signed: Benjiman Core  02/01/2018, 10:02 AM

## 2018-02-12 DIAGNOSIS — E041 Nontoxic single thyroid nodule: Secondary | ICD-10-CM | POA: Diagnosis not present

## 2018-02-12 DIAGNOSIS — D500 Iron deficiency anemia secondary to blood loss (chronic): Secondary | ICD-10-CM | POA: Diagnosis not present

## 2018-02-12 DIAGNOSIS — N183 Chronic kidney disease, stage 3 (moderate): Secondary | ICD-10-CM | POA: Diagnosis not present

## 2018-02-12 DIAGNOSIS — E0590 Thyrotoxicosis, unspecified without thyrotoxic crisis or storm: Secondary | ICD-10-CM | POA: Diagnosis not present

## 2018-02-12 DIAGNOSIS — M150 Primary generalized (osteo)arthritis: Secondary | ICD-10-CM | POA: Diagnosis not present

## 2018-02-12 DIAGNOSIS — I129 Hypertensive chronic kidney disease with stage 1 through stage 4 chronic kidney disease, or unspecified chronic kidney disease: Secondary | ICD-10-CM | POA: Diagnosis not present

## 2018-02-12 DIAGNOSIS — I10 Essential (primary) hypertension: Secondary | ICD-10-CM | POA: Diagnosis not present

## 2018-02-13 ENCOUNTER — Encounter (INDEPENDENT_AMBULATORY_CARE_PROVIDER_SITE_OTHER): Payer: Self-pay | Admitting: Orthopaedic Surgery

## 2018-02-13 ENCOUNTER — Ambulatory Visit (INDEPENDENT_AMBULATORY_CARE_PROVIDER_SITE_OTHER): Payer: PPO | Admitting: Orthopaedic Surgery

## 2018-02-13 ENCOUNTER — Ambulatory Visit (INDEPENDENT_AMBULATORY_CARE_PROVIDER_SITE_OTHER): Payer: PPO

## 2018-02-13 VITALS — BP 124/78 | HR 70 | Ht 60.0 in | Wt 170.0 lb

## 2018-02-13 DIAGNOSIS — M1611 Unilateral primary osteoarthritis, right hip: Secondary | ICD-10-CM | POA: Diagnosis not present

## 2018-02-13 DIAGNOSIS — Z96641 Presence of right artificial hip joint: Secondary | ICD-10-CM

## 2018-02-13 NOTE — Progress Notes (Signed)
Post-Op Visit Note   Patient: Christina Booker           Date of Birth: 1940-05-13           MRN: [id number] Visit Date: 02/13/2018 PCP: Raina Mina., MD   Assessment & Plan: Post right total hip arthroplasty Staples removed incision looks good.  She had postoperative anemia and required blood transfusion.  Dr. Shellee Milo was following her blood count and I discussed with her that usually by 2 weeks she has had significant improvement and should return to normal by 4 weeks.  Chief Complaint:  Chief Complaint  Patient presents with  . Right Hip - Routine Post Op    01/26/18 Right THA Direct Anterior    Visit Diagnoses:  1. Unilateral primary osteoarthritis, right hip   2. History of total hip arthroplasty, right     Plan: Continue ambulating with a walker when she is doing better she can transition to a cane in her left hand.  I will check her in 5 weeks..  Follow-Up Instructions: Return in about 5 weeks (around 03/20/2018).   Orders:  Orders Placed This Encounter  Procedures  . XR HIP UNILAT W OR W/O PELVIS 2-3 VIEWS RIGHT   No orders of the defined types were placed in this encounter.   Imaging: Xr Hip Unilat W Or W/o Pelvis 2-3 Views Right  Result Date: 02/13/2018 Standing AP frog-leg x-ray right hip obtained and reviewed.  This shows right total hip arthroplasty with equal leg lengths.  No subsidence. Impression: Satisfactory right total hip arthroplasty   PMFS History: Patient Active Problem List   Diagnosis Date Noted  . Arthritis of right hip 01/26/2018  . PONV (postoperative nausea and vomiting) 11/21/2016  . Lumbar stenosis 11/21/2016  . History of fusion of cervical spine 10/11/2016  . Cervical spinal stenosis 09/07/2016  . Spinal stenosis of lumbar region with neurogenic claudication 06/01/2016  . Cervical radiculopathy 05/12/2016  . Spinal stenosis of cervical region 05/12/2016  . Cervicalgia 05/12/2016  . Ganglion of left hand 12/22/2014  . S/P  lumbar fusion 04/16/2014   Past Medical History:  Diagnosis Date  . Arthritis    hands, back  . Arthritis of right hip   . Cervical spinal stenosis   . Complication of anesthesia   . Constipation   . Headache   . Hot flashes   . Hyperlipidemia   . Hypertension   . Hyperthyroidism    current problem  . Irritable bowel syndrome   . PAF (paroxysmal atrial fibrillation) ([place])    declined asnticoagulation  . PONV (postoperative nausea and vomiting)   . Rosacea   . Stroke [facility]) 12/01/2009   tia  2010    Family History  Problem Relation Age of Onset  . Cancer Mother   . Uterine cancer Mother   . Hypertension Mother   . Heart attack Father     Past Surgical History:  Procedure Laterality Date  . ABDOMINAL HYSTERECTOMY    . ANTERIOR CERVICAL DECOMP/DISCECTOMY FUSION N/A 09/07/2016   Procedure: C7-T1 Anterior Cervical Discectomy and Fusion, Allograft, Plate;  Surgeon: Marybelle Killings, MD;  Location: [facility];  Service: Orthopedics;  Laterality: N/A;  . BACK SURGERY    . CARPAL TUNNEL RELEASE Right 2009  . CARPAL TUNNEL RELEASE Left   . CATARACT EXTRACTION, BILATERAL    . CERVICAL DISCECTOMY  09/07/2016   C7-T1 Anterior Cervical Discectomy and Fusion, Allograft, Plate (N/A)  . CHOLECYSTECTOMY    . EYE  SURGERY    . GANGLION CYST EXCISION Left 12/22/2014   Procedure: Excision Left Hand Ganglion cyst;  Surgeon: Marybelle Killings, MD;  Location: [facility];  Service: Orthopedics;  Laterality: Left;  . HEMORRHOID SURGERY    . TOTAL HIP ARTHROPLASTY Right 01/26/2018   Procedure: RIGHT TOTAL HIP ARTHROPLASTY ANTERIOR APPROACH;  Surgeon: Marybelle Killings, MD;  Location: [facility];  Service: Orthopedics;  Laterality: Right;  . TUBAL LIGATION     Social History   Occupational History  . Not on file  Tobacco Use  . Smoking status: Former Smoker    Last attempt to quit: 04/09/1992    Years since quitting: 25.8  . Smokeless tobacco: Never Used  Substance and Sexual Activity  . Alcohol  use: Yes    Alcohol/week: 4.0 standard drinks    Types: 4 Glasses of wine per week    Comment: 4  . Drug use: No  . Sexual activity: Not on file

## 2018-03-20 ENCOUNTER — Encounter (INDEPENDENT_AMBULATORY_CARE_PROVIDER_SITE_OTHER): Payer: Self-pay | Admitting: Orthopaedic Surgery

## 2018-03-20 ENCOUNTER — Ambulatory Visit (INDEPENDENT_AMBULATORY_CARE_PROVIDER_SITE_OTHER): Payer: PPO | Admitting: Orthopaedic Surgery

## 2018-03-20 VITALS — BP 142/92 | HR 75 | Ht 60.0 in | Wt 170.0 lb

## 2018-03-20 DIAGNOSIS — Z96641 Presence of right artificial hip joint: Secondary | ICD-10-CM | POA: Insufficient documentation

## 2018-03-20 NOTE — Progress Notes (Signed)
Follow-up right anterior total of arthroplasty 01/26/2018.  She has little bit of numbness adjacent to the incision.  She is walking well without a limp.  She relates it took longer to rehab from this from her lumbar spine fusion.  She does have some mild discomfort in the right medial knee where previous x-rays showed minimal narrowing.  She will continue to work on increasing her walking distance for general health.  Office follow-up as needed.  She is happy with the surgical result.

## 2018-03-22 DIAGNOSIS — E042 Nontoxic multinodular goiter: Secondary | ICD-10-CM | POA: Diagnosis not present

## 2018-03-22 DIAGNOSIS — E041 Nontoxic single thyroid nodule: Secondary | ICD-10-CM | POA: Diagnosis not present

## 2018-04-18 DIAGNOSIS — B9689 Other specified bacterial agents as the cause of diseases classified elsewhere: Secondary | ICD-10-CM | POA: Diagnosis not present

## 2018-04-18 DIAGNOSIS — J019 Acute sinusitis, unspecified: Secondary | ICD-10-CM | POA: Diagnosis not present

## 2018-05-15 DIAGNOSIS — M150 Primary generalized (osteo)arthritis: Secondary | ICD-10-CM | POA: Diagnosis not present

## 2018-05-15 DIAGNOSIS — F3340 Major depressive disorder, recurrent, in remission, unspecified: Secondary | ICD-10-CM | POA: Diagnosis not present

## 2018-05-15 DIAGNOSIS — E782 Mixed hyperlipidemia: Secondary | ICD-10-CM | POA: Diagnosis not present

## 2018-05-15 DIAGNOSIS — N183 Chronic kidney disease, stage 3 (moderate): Secondary | ICD-10-CM | POA: Diagnosis not present

## 2018-05-15 DIAGNOSIS — D500 Iron deficiency anemia secondary to blood loss (chronic): Secondary | ICD-10-CM | POA: Diagnosis not present

## 2018-05-15 DIAGNOSIS — R7303 Prediabetes: Secondary | ICD-10-CM | POA: Diagnosis not present

## 2018-05-15 DIAGNOSIS — E042 Nontoxic multinodular goiter: Secondary | ICD-10-CM | POA: Diagnosis not present

## 2018-05-15 DIAGNOSIS — I129 Hypertensive chronic kidney disease with stage 1 through stage 4 chronic kidney disease, or unspecified chronic kidney disease: Secondary | ICD-10-CM | POA: Diagnosis not present

## 2018-05-15 DIAGNOSIS — E041 Nontoxic single thyroid nodule: Secondary | ICD-10-CM | POA: Diagnosis not present

## 2018-05-15 DIAGNOSIS — E0590 Thyrotoxicosis, unspecified without thyrotoxic crisis or storm: Secondary | ICD-10-CM | POA: Diagnosis not present

## 2018-07-16 ENCOUNTER — Other Ambulatory Visit: Payer: Self-pay

## 2018-07-26 DIAGNOSIS — L821 Other seborrheic keratosis: Secondary | ICD-10-CM | POA: Diagnosis not present

## 2018-07-26 DIAGNOSIS — L578 Other skin changes due to chronic exposure to nonionizing radiation: Secondary | ICD-10-CM | POA: Diagnosis not present

## 2018-07-26 DIAGNOSIS — L820 Inflamed seborrheic keratosis: Secondary | ICD-10-CM | POA: Diagnosis not present

## 2018-07-26 DIAGNOSIS — L570 Actinic keratosis: Secondary | ICD-10-CM | POA: Diagnosis not present

## 2018-10-01 DIAGNOSIS — E0510 Thyrotoxicosis with toxic single thyroid nodule without thyrotoxic crisis or storm: Secondary | ICD-10-CM | POA: Diagnosis not present

## 2018-10-01 DIAGNOSIS — E042 Nontoxic multinodular goiter: Secondary | ICD-10-CM | POA: Diagnosis not present

## 2018-11-06 ENCOUNTER — Ambulatory Visit (INDEPENDENT_AMBULATORY_CARE_PROVIDER_SITE_OTHER): Payer: PPO | Admitting: Orthopaedic Surgery

## 2018-11-06 ENCOUNTER — Ambulatory Visit (INDEPENDENT_AMBULATORY_CARE_PROVIDER_SITE_OTHER): Payer: PPO

## 2018-11-06 ENCOUNTER — Encounter: Payer: Self-pay | Admitting: Orthopaedic Surgery

## 2018-11-06 VITALS — Ht 60.0 in | Wt 170.0 lb

## 2018-11-06 DIAGNOSIS — M65341 Trigger finger, right ring finger: Secondary | ICD-10-CM | POA: Diagnosis not present

## 2018-11-06 DIAGNOSIS — M48061 Spinal stenosis, lumbar region without neurogenic claudication: Secondary | ICD-10-CM

## 2018-11-06 DIAGNOSIS — M79604 Pain in right leg: Secondary | ICD-10-CM | POA: Diagnosis not present

## 2018-11-06 DIAGNOSIS — Z981 Arthrodesis status: Secondary | ICD-10-CM

## 2018-11-06 NOTE — Progress Notes (Signed)
Office Visit Note   Patient: Christina Booker           Date of Birth: [birth year]           MRN: [id number] Visit Date: 11/06/2018              Requested by: Raina Mina., MD [place] [place],  [place] [address] PCP: Raina Mina., MD   Assessment & Plan: Visit Diagnoses:  1. Pain in right leg   2. Trigger finger, right ring finger   3. Spinal stenosis of lumbar region, unspecified whether neurogenic claudication present   4. S/P lumbar fusion     Plan: Trigger finger injection performed with good relief.  Pathophysiology discussed.  She is already had bilateral carpal tunnel releases by Dr. Almira Bar in the past.  She may have some progression of foraminal narrowing above the solid fusion.  She can work on some quad strengthening exercises which we discussed.  She can return if she has increased symptoms.  Trigger finger release discussed if she has persistent triggering.  Follow-Up Instructions: No follow-ups on file.   Orders:  Orders Placed This Encounter  Procedures   Hand/UE Inj: R ring A1   XR Lumbar Spine 2-3 Views   XR HIP UNILAT W OR W/O PELVIS 2-3 VIEWS RIGHT   No orders of the defined types were placed in this encounter.     Procedures: Hand/UE Inj: R ring A1 for trigger finger on 11/06/2018 4:07 PM Medications: 0.3 mL lidocaine 1 %; 0.33 mL bupivacaine 0.25 %; 13.33 mg methylPREDNISolone acetate 40 MG/ML      Clinical Data: No additional findings.   Subjective: Chief Complaint  Patient presents with   Lower Back - Pain    11/21/2016 Right L3-4 TLIF,, Removal of Intraspinal, Extradural Cyst, Pedicle Instrumentation and Exchange, Bilateral Lateral Fusion   Right Leg - Pain    01/26/18 Right THA    HPI 78 year old female returns and states she is having some right thigh numbness with some pain in the right groin.  The lip arthroplasty 01/26/2018 had done well.  Previous L3-4T lift with some degenerative endplate changes and mild  narrowing at L2-3.  Patient states she is also had some triggering of her right ring finger with locking.  She also has some based of the thumb pain on the right side particularly with gripping and squeezing and points to the first [facility] joint where she has discomfort.  She is used Tylenol.  She denies neurogenic claudication symptoms.  Review of Systems 14 point systems updated unchanged from November 2019 hip procedure other than as mentioned in HPI.  No chills or fever.  She denies any cellulitis no drainage.  Previous cervical fusion.  Instrumented lumbar fusion initially at L4-5 and then additional L3-4 level fused 11/21/2016.   Objective: Vital Signs: Ht 5' (1.524 m)    Wt 170 lb (77.1 kg)    BMI 33.20 kg/m   Physical Exam Constitutional:      Appearance: She is well-developed.  HENT:     Head: Normocephalic.     Right Ear: External ear normal.     Left Ear: External ear normal.  Eyes:     Pupils: Pupils are equal, round, and reactive to light.  Neck:     Thyroid: No thyromegaly.     Trachea: No tracheal deviation.  Cardiovascular:     Rate and Rhythm: Normal rate.  Pulmonary:     Effort: Pulmonary effort is  normal.  Abdominal:     Palpations: Abdomen is soft.  Skin:    General: Skin is warm and dry.  Neurological:     Mental Status: She is alert and oriented to person, place, and time.  Psychiatric:        Behavior: Behavior normal.     Ortho Exam patient has tenderness right ring finger over the A1 pulley is able to demonstrate locking.  Right direct anterior incisions well-healed.  Some decreased sensation anteriorly over the thumb no hip flexion weakness trace quad weakness on the right negative on the left knee and ankle jerk are intact.  Anterior tib gastrocsoleus is strong.  Distal pulses are intact.  Negative logroll to the hips no trochanteric tenderness right or left.  Specialty Comments:  No specialty comments available.  Imaging: Xr Hip Unilat W Or W/o Pelvis  2-3 Views Right  Result Date: 11/07/2018 AP pelvis frog-leg lateral right hip obtained and reviewed.  This shows well-positioned total hip arthroplasty without evidence of loosening or subsidence.  Instrumented lumbar fusion noted better visualized on lumbar images.  No heterotopic ossification. Impression: Satisfactory right total hip arthroplasty postop images.  Xr Lumbar Spine 2-3 Views  Result Date: 11/07/2018 AP lateral lumbar x-rays obtained and reviewed.  This shows L3-L5 instrumented fusion without screw loosening good position of the cages at midline.  There is 2 mm retrolisthesis at L2-3 unchanged from November 2019 images with L1 and L2 endplate changes.  Facet arthropathy more prominent on the right at L2-3 level than the left.  No evidence of compression fractures. Impression: Satisfactory L3-L5 fusion.  Disc base narrowing and spurring at the 2 levels above the fusion.    PMFS History: Patient Active Problem List   Diagnosis Date Noted   Trigger finger, right ring finger 11/06/2018   History of total right hip replacement 03/20/2018   PONV (postoperative nausea and vomiting) 11/21/2016   Lumbar stenosis 11/21/2016   History of fusion of cervical spine 10/11/2016   Cervical spinal stenosis 09/07/2016   Spinal stenosis of lumbar region with neurogenic claudication 06/01/2016   Cervical radiculopathy 05/12/2016   Spinal stenosis of cervical region 05/12/2016   Cervicalgia 05/12/2016   Ganglion of left hand 12/22/2014   S/P lumbar fusion 04/16/2014   Past Medical History:  Diagnosis Date   Arthritis    hands, back   Arthritis of right hip    Cervical spinal stenosis    Complication of anesthesia    Constipation    Headache    Hot flashes    Hyperlipidemia    Hypertension    Hyperthyroidism    current problem   Irritable bowel syndrome    PAF (paroxysmal atrial fibrillation) ([place])    declined asnticoagulation   PONV (postoperative nausea  and vomiting)    Rosacea    Stroke ([place]) 12/01/2009   tia  2010    Family History  Problem Relation Age of Onset   Cancer Mother    Uterine cancer Mother    Hypertension Mother    Heart attack Father     Past Surgical History:  Procedure Laterality Date   ABDOMINAL HYSTERECTOMY     ANTERIOR CERVICAL DECOMP/DISCECTOMY FUSION N/A 09/07/2016   Procedure: C7-T1 Anterior Cervical Discectomy and Fusion, Allograft, Plate;  Surgeon: Marybelle Killings, MD;  Location: [facility];  Service: Orthopedics;  Laterality: N/A;   BACK SURGERY     CARPAL TUNNEL RELEASE Right 2009   CARPAL TUNNEL RELEASE Left    CATARACT  EXTRACTION, BILATERAL     CERVICAL DISCECTOMY  09/07/2016   C7-T1 Anterior Cervical Discectomy and Fusion, Allograft, Plate (N/A)   CHOLECYSTECTOMY     EYE SURGERY     GANGLION CYST EXCISION Left 12/22/2014   Procedure: Excision Left Hand Ganglion cyst;  Surgeon: Marybelle Killings, MD;  Location: [facility];  Service: Orthopedics;  Laterality: Left;   HEMORRHOID SURGERY     TOTAL HIP ARTHROPLASTY Right 01/26/2018   Procedure: RIGHT TOTAL HIP ARTHROPLASTY ANTERIOR APPROACH;  Surgeon: Marybelle Killings, MD;  Location: [facility];  Service: Orthopedics;  Laterality: Right;   TUBAL LIGATION     Social History   Occupational History   Not on file  Tobacco Use   Smoking status: Former Smoker    Quit date: 04/09/1992    Years since quitting: 26.5   Smokeless tobacco: Never Used  Substance and Sexual Activity   Alcohol use: Yes    Alcohol/week: 4.0 standard drinks    Types: 4 Glasses of wine per week    Comment: 4   Drug use: No   Sexual activity: Not on file

## 2018-11-07 ENCOUNTER — Encounter: Payer: Self-pay | Admitting: Orthopaedic Surgery

## 2018-11-07 MED ORDER — METHYLPREDNISOLONE ACETATE 40 MG/ML IJ SUSP
13.3300 mg | INTRAMUSCULAR | Status: AC | PRN
  Administered 2018-11-06: 13.33 mg

## 2018-11-07 MED ORDER — LIDOCAINE HCL 1 % IJ SOLN
0.3000 mL | INTRAMUSCULAR | Status: AC | PRN
  Administered 2018-11-06: .3 mL

## 2018-11-07 MED ORDER — BUPIVACAINE HCL 0.25 % IJ SOLN
0.3300 mL | INTRAMUSCULAR | Status: AC | PRN
  Administered 2018-11-06: .33 mL

## 2018-12-25 DIAGNOSIS — M5136 Other intervertebral disc degeneration, lumbar region: Secondary | ICD-10-CM | POA: Diagnosis not present

## 2018-12-25 DIAGNOSIS — E0590 Thyrotoxicosis, unspecified without thyrotoxic crisis or storm: Secondary | ICD-10-CM | POA: Diagnosis not present

## 2018-12-25 DIAGNOSIS — N183 Chronic kidney disease, stage 3 unspecified: Secondary | ICD-10-CM | POA: Diagnosis not present

## 2018-12-25 DIAGNOSIS — Z79899 Other long term (current) drug therapy: Secondary | ICD-10-CM | POA: Diagnosis not present

## 2018-12-25 DIAGNOSIS — F330 Major depressive disorder, recurrent, mild: Secondary | ICD-10-CM | POA: Diagnosis not present

## 2018-12-25 DIAGNOSIS — M8949 Other hypertrophic osteoarthropathy, multiple sites: Secondary | ICD-10-CM | POA: Diagnosis not present

## 2018-12-25 DIAGNOSIS — E782 Mixed hyperlipidemia: Secondary | ICD-10-CM | POA: Diagnosis not present

## 2018-12-25 DIAGNOSIS — Z9889 Other specified postprocedural states: Secondary | ICD-10-CM | POA: Diagnosis not present

## 2018-12-25 DIAGNOSIS — R7303 Prediabetes: Secondary | ICD-10-CM | POA: Diagnosis not present

## 2018-12-25 DIAGNOSIS — Z0000 Encounter for general adult medical examination without abnormal findings: Secondary | ICD-10-CM | POA: Diagnosis not present

## 2018-12-25 DIAGNOSIS — Z9181 History of falling: Secondary | ICD-10-CM | POA: Diagnosis not present

## 2018-12-25 DIAGNOSIS — Z8673 Personal history of transient ischemic attack (TIA), and cerebral infarction without residual deficits: Secondary | ICD-10-CM | POA: Diagnosis not present

## 2018-12-25 DIAGNOSIS — E6609 Other obesity due to excess calories: Secondary | ICD-10-CM | POA: Diagnosis not present

## 2019-01-26 DIAGNOSIS — Z1231 Encounter for screening mammogram for malignant neoplasm of breast: Secondary | ICD-10-CM | POA: Diagnosis not present

## 2019-01-28 DIAGNOSIS — L821 Other seborrheic keratosis: Secondary | ICD-10-CM | POA: Diagnosis not present

## 2019-01-28 DIAGNOSIS — L820 Inflamed seborrheic keratosis: Secondary | ICD-10-CM | POA: Diagnosis not present

## 2019-03-25 IMAGING — RF DG CERVICAL SPINE COMPLETE 4+V
1 series · 7 of 7 positions shown · non-contrast
Comparison: 04/27/2016.

ADDENDUM:
Original report by Mustafa Emir. Riquiac. Addendum by Mustafa Emir. [place]: Two
additional lateral intraoperative spot fluoroscopic images of the
cervical spine were added to the original set of images. Hemostats
on the image labeled #6 ([DATE]) are obscured by the patient's
shoulders. Hemostats on the image labeled #7 ([DATE]) project
anterior to the C6-7 disc space level. These additional findings
were called to the operating room at [DATE] p.m..
CLINICAL DATA: Cervical spine surgery.

EXAM:
CERVICAL SPINE - COMPLETE 4+ VIEW

[Series 1: run · 7 of 7 slices shown]
[im 1/7]
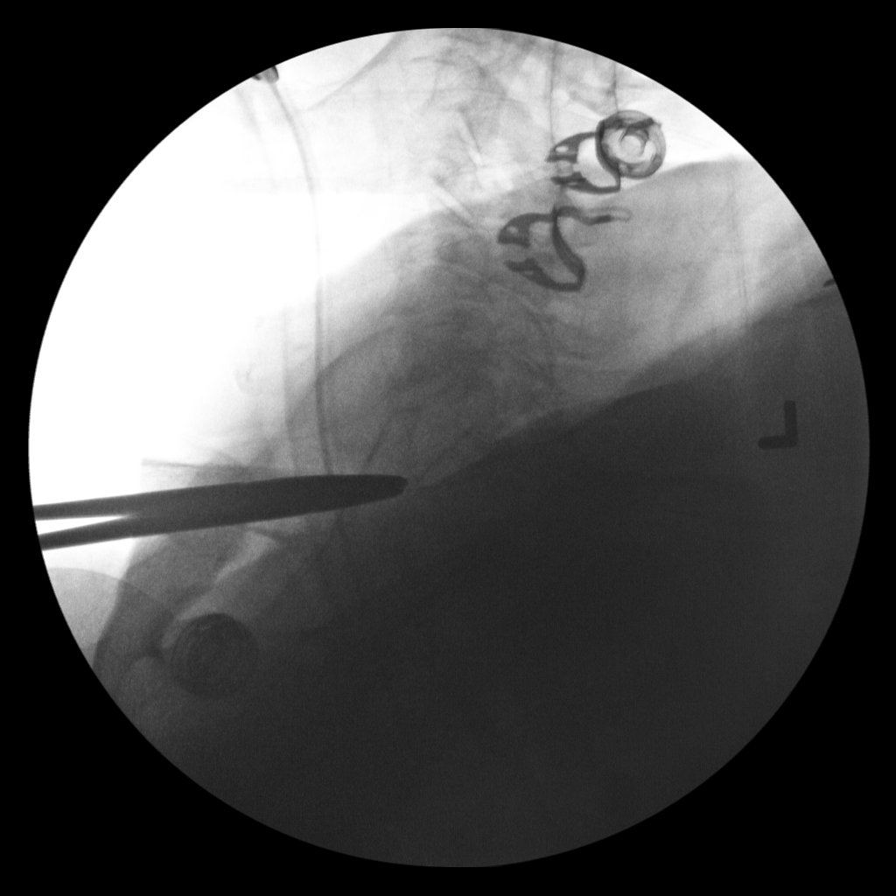
[im 2/7]
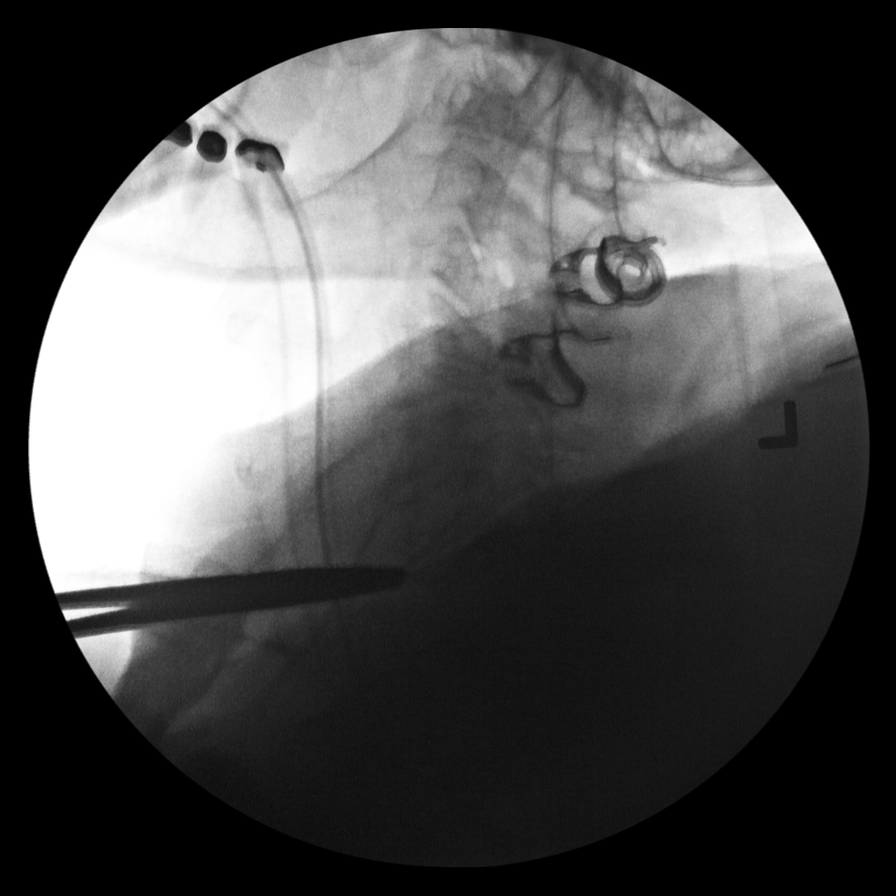
[im 3/7]
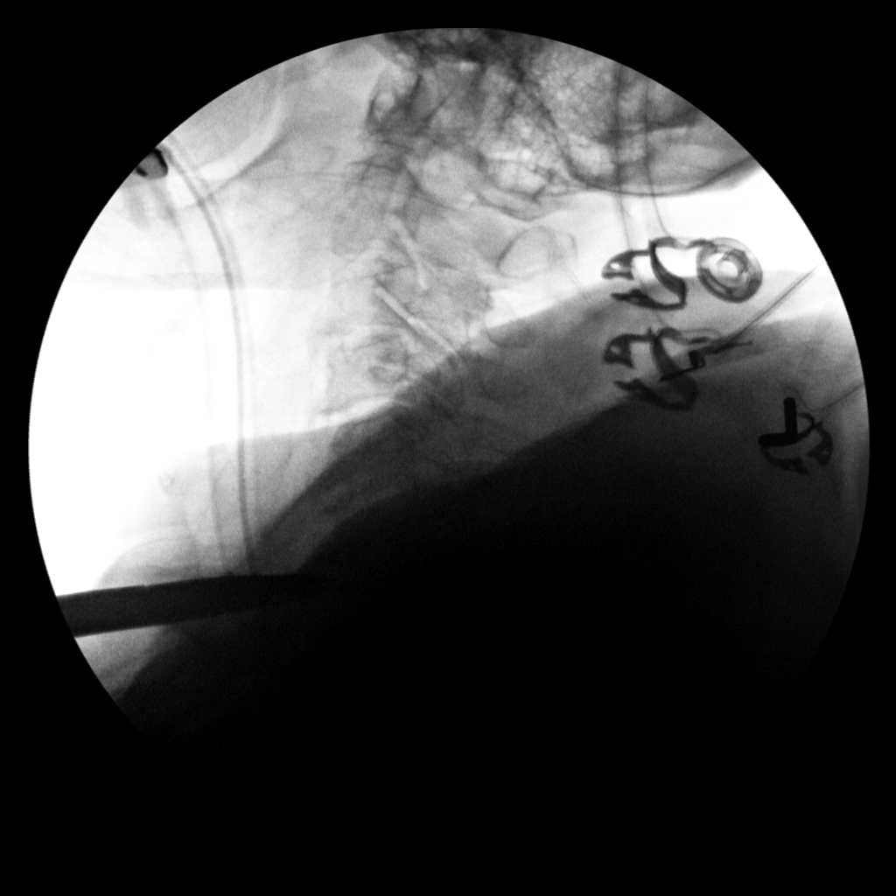
[im 4/7]
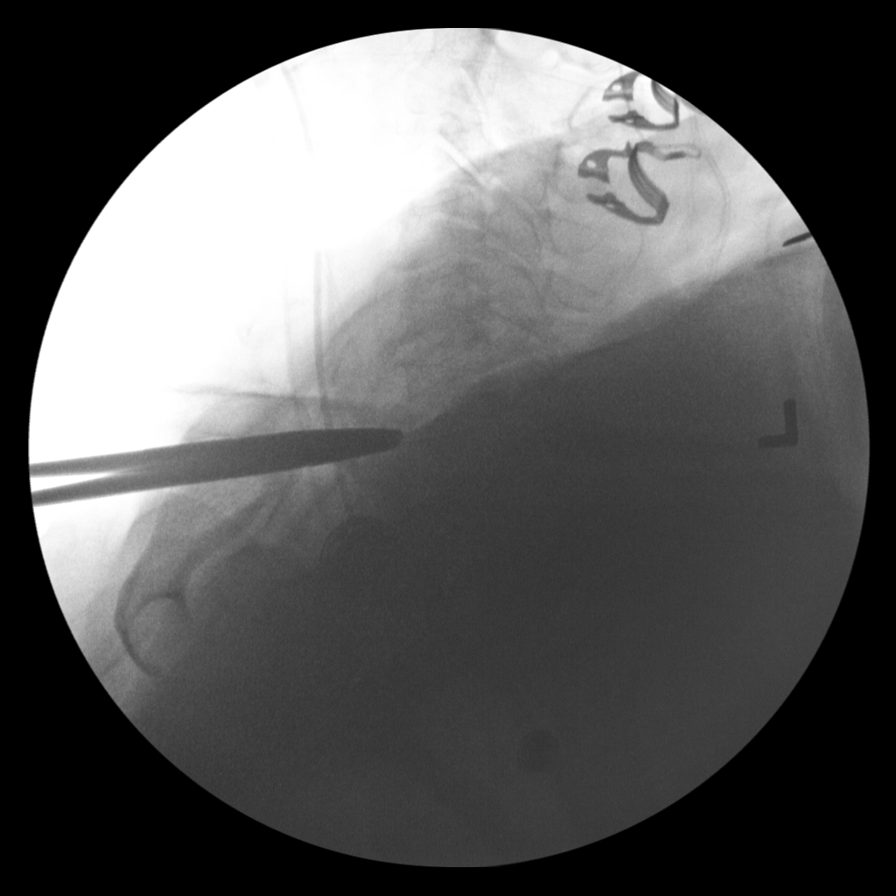
[im 5/7]
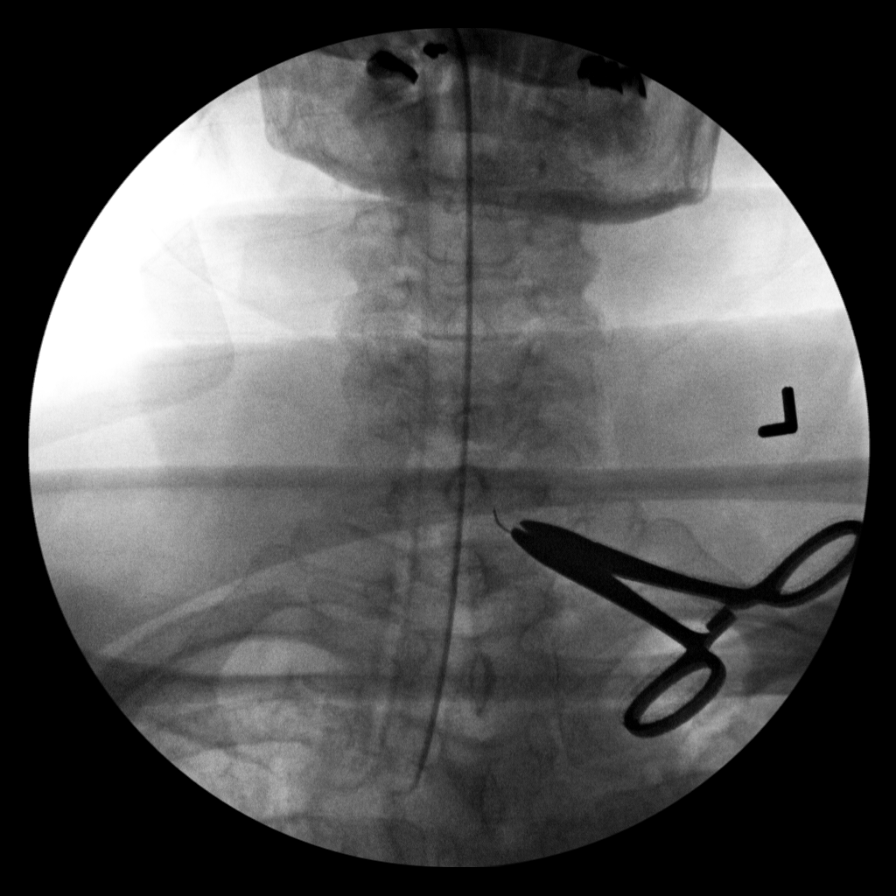
[im 6/7]
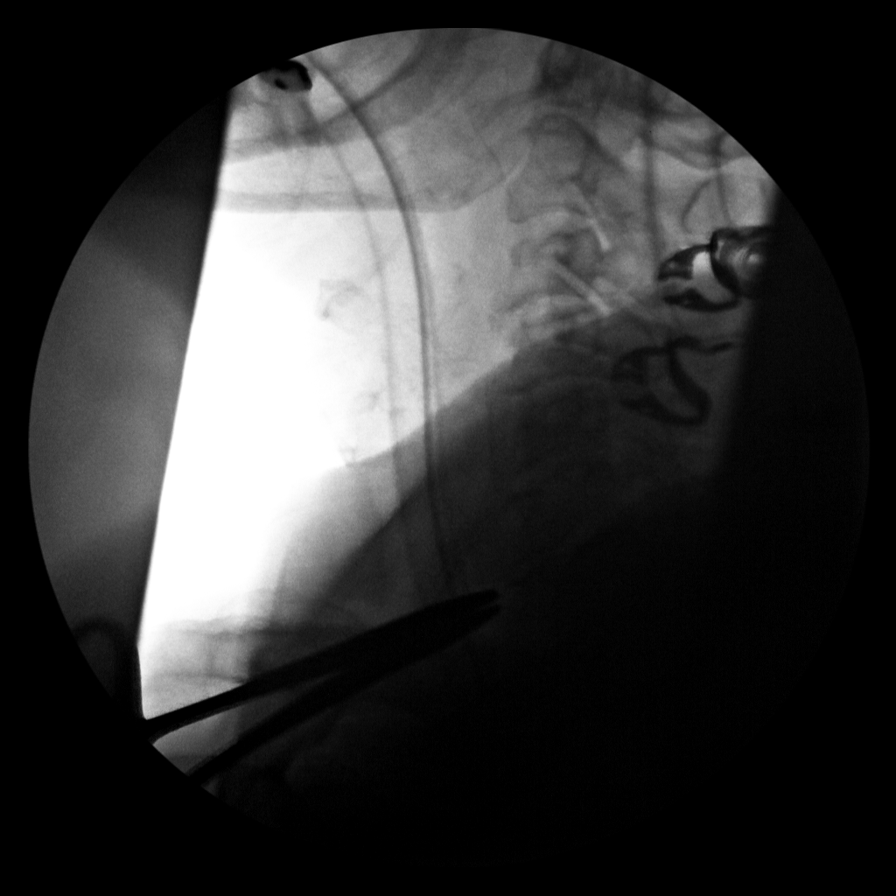
[im 7/7]
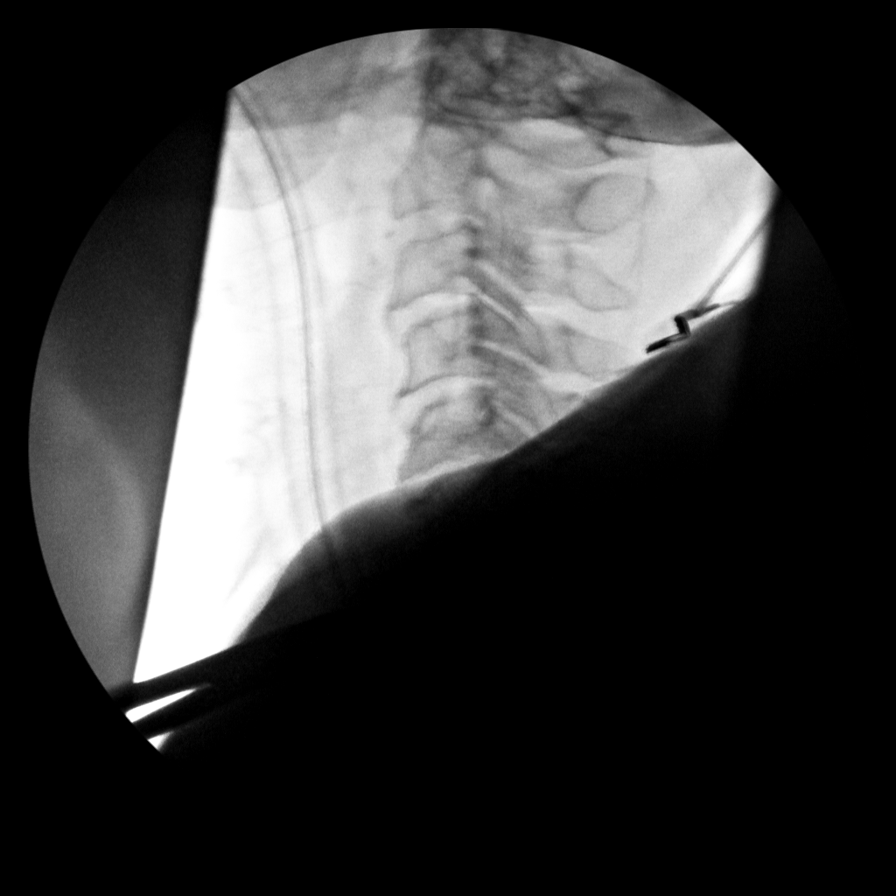

[7 of 7 positions shown; findings below may reference images not displayed]

FINDINGS: Postsurgical changes cervical spine. Numbering on the lateral view
is difficult due to technique. On AP view metallic marker and
hemostat projected over the C7-T1 level.
IMPRESSION: Cervical spine intraoperative images as above. Report phoned to
operating room at the time of the study.

## 2019-06-08 IMAGING — RF DG LUMBAR SPINE 2-3V
1 series · 2 of 2 positions shown · non-contrast
Comparison: 04/27/2016 lumbar spine radiographs

CLINICAL DATA: L3-4 TLIF

EXAM:
LUMBAR SPINE - 2-3 VIEW; DG C-ARM 61-120 MIN

[Series 1: run · 2 of 2 slices shown]
[im 1/2]
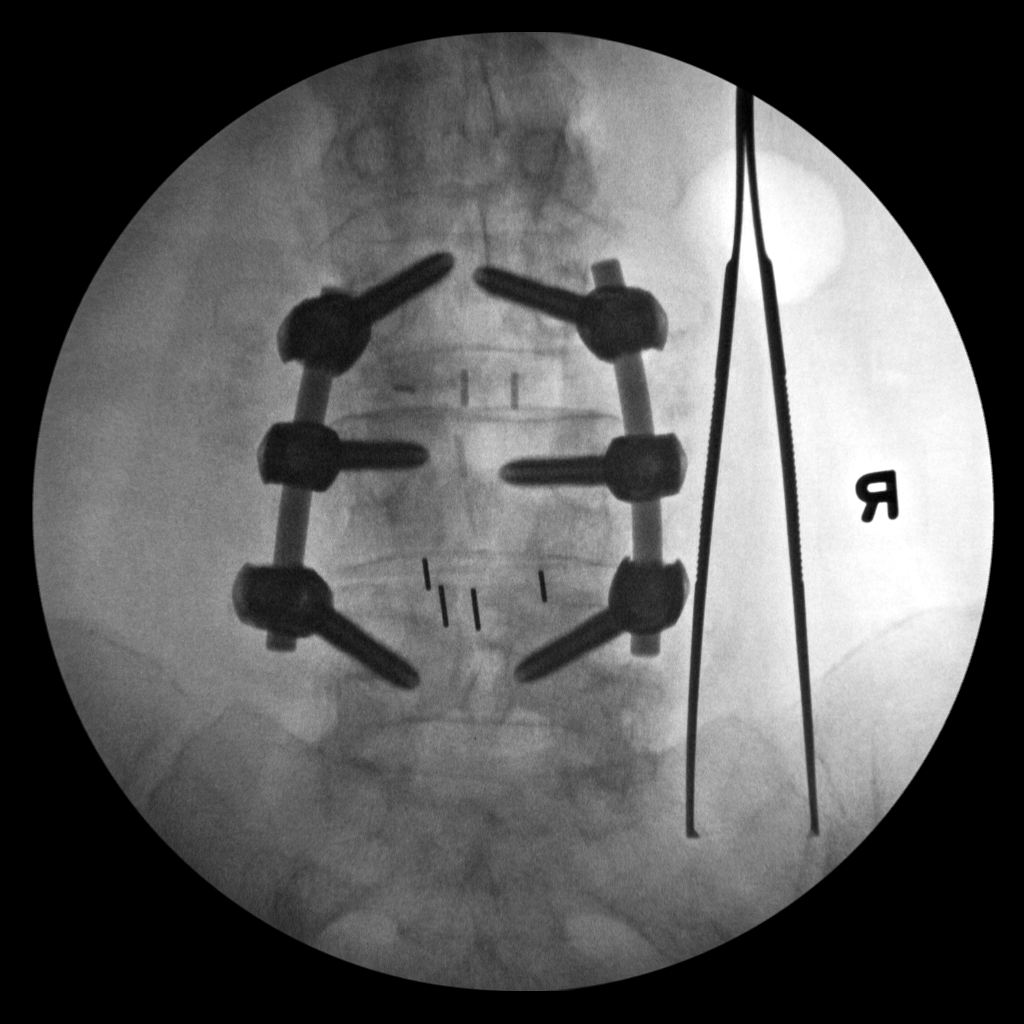
[im 2/2]
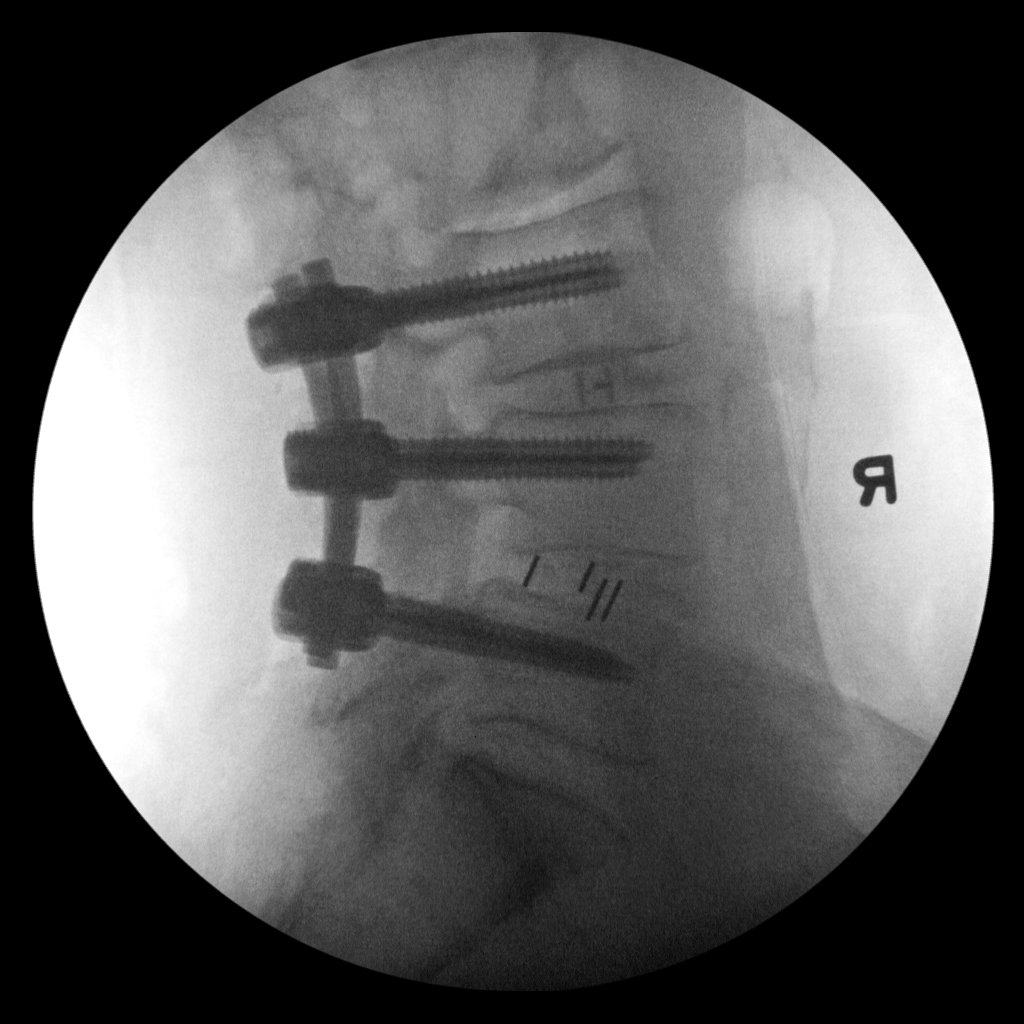

[2 of 2 positions shown; findings below may reference images not displayed]

FINDINGS: New interbody block at L3-4 with posterior spinal fusion hardware is
noted. Pre-existing L4-5 posterior spinal fusion hardware with
interbody block is again seen without significant change.
IMPRESSION: Lumbar spinal fusion hardware now spans L3 through L5 status post
TLIF at L3-4. No immediate complications identified.

## 2019-07-01 DIAGNOSIS — F330 Major depressive disorder, recurrent, mild: Secondary | ICD-10-CM | POA: Diagnosis not present

## 2019-07-01 DIAGNOSIS — Z6833 Body mass index (BMI) 33.0-33.9, adult: Secondary | ICD-10-CM | POA: Diagnosis not present

## 2019-07-01 DIAGNOSIS — R7303 Prediabetes: Secondary | ICD-10-CM | POA: Diagnosis not present

## 2019-07-01 DIAGNOSIS — E6609 Other obesity due to excess calories: Secondary | ICD-10-CM | POA: Diagnosis not present

## 2019-07-01 DIAGNOSIS — R269 Unspecified abnormalities of gait and mobility: Secondary | ICD-10-CM | POA: Diagnosis not present

## 2019-07-01 DIAGNOSIS — N1831 Chronic kidney disease, stage 3a: Secondary | ICD-10-CM | POA: Diagnosis not present

## 2019-07-01 DIAGNOSIS — E782 Mixed hyperlipidemia: Secondary | ICD-10-CM | POA: Diagnosis not present

## 2019-07-01 DIAGNOSIS — E0590 Thyrotoxicosis, unspecified without thyrotoxic crisis or storm: Secondary | ICD-10-CM | POA: Diagnosis not present

## 2019-07-01 DIAGNOSIS — I129 Hypertensive chronic kidney disease with stage 1 through stage 4 chronic kidney disease, or unspecified chronic kidney disease: Secondary | ICD-10-CM | POA: Diagnosis not present

## 2019-07-01 DIAGNOSIS — I700 Atherosclerosis of aorta: Secondary | ICD-10-CM | POA: Diagnosis not present

## 2019-07-01 DIAGNOSIS — Z8673 Personal history of transient ischemic attack (TIA), and cerebral infarction without residual deficits: Secondary | ICD-10-CM | POA: Diagnosis not present

## 2019-10-01 DIAGNOSIS — M5386 Other specified dorsopathies, lumbar region: Secondary | ICD-10-CM | POA: Diagnosis not present

## 2019-10-01 DIAGNOSIS — M10472 Other secondary gout, left ankle and foot: Secondary | ICD-10-CM | POA: Diagnosis not present

## 2019-10-07 DIAGNOSIS — E0520 Thyrotoxicosis with toxic multinodular goiter without thyrotoxic crisis or storm: Secondary | ICD-10-CM | POA: Diagnosis not present

## 2019-10-07 DIAGNOSIS — E0510 Thyrotoxicosis with toxic single thyroid nodule without thyrotoxic crisis or storm: Secondary | ICD-10-CM | POA: Diagnosis not present

## 2019-10-07 DIAGNOSIS — E042 Nontoxic multinodular goiter: Secondary | ICD-10-CM | POA: Diagnosis not present

## 2019-12-31 DIAGNOSIS — Z8673 Personal history of transient ischemic attack (TIA), and cerebral infarction without residual deficits: Secondary | ICD-10-CM | POA: Diagnosis not present

## 2019-12-31 DIAGNOSIS — M8949 Other hypertrophic osteoarthropathy, multiple sites: Secondary | ICD-10-CM | POA: Diagnosis not present

## 2019-12-31 DIAGNOSIS — I130 Hypertensive heart and chronic kidney disease with heart failure and stage 1 through stage 4 chronic kidney disease, or unspecified chronic kidney disease: Secondary | ICD-10-CM | POA: Diagnosis not present

## 2019-12-31 DIAGNOSIS — I5032 Chronic diastolic (congestive) heart failure: Secondary | ICD-10-CM | POA: Diagnosis not present

## 2019-12-31 DIAGNOSIS — E782 Mixed hyperlipidemia: Secondary | ICD-10-CM | POA: Diagnosis not present

## 2019-12-31 DIAGNOSIS — K219 Gastro-esophageal reflux disease without esophagitis: Secondary | ICD-10-CM | POA: Diagnosis not present

## 2019-12-31 DIAGNOSIS — R269 Unspecified abnormalities of gait and mobility: Secondary | ICD-10-CM | POA: Diagnosis not present

## 2019-12-31 DIAGNOSIS — F330 Major depressive disorder, recurrent, mild: Secondary | ICD-10-CM | POA: Diagnosis not present

## 2019-12-31 DIAGNOSIS — R7303 Prediabetes: Secondary | ICD-10-CM | POA: Diagnosis not present

## 2019-12-31 DIAGNOSIS — I10 Essential (primary) hypertension: Secondary | ICD-10-CM | POA: Diagnosis not present

## 2019-12-31 DIAGNOSIS — E0590 Thyrotoxicosis, unspecified without thyrotoxic crisis or storm: Secondary | ICD-10-CM | POA: Diagnosis not present

## 2019-12-31 DIAGNOSIS — Z0000 Encounter for general adult medical examination without abnormal findings: Secondary | ICD-10-CM | POA: Diagnosis not present

## 2019-12-31 DIAGNOSIS — N1831 Chronic kidney disease, stage 3a: Secondary | ICD-10-CM | POA: Diagnosis not present

## 2019-12-31 DIAGNOSIS — E6609 Other obesity due to excess calories: Secondary | ICD-10-CM | POA: Diagnosis not present

## 2020-01-30 DIAGNOSIS — M8949 Other hypertrophic osteoarthropathy, multiple sites: Secondary | ICD-10-CM | POA: Diagnosis not present

## 2020-02-27 DIAGNOSIS — Z1231 Encounter for screening mammogram for malignant neoplasm of breast: Secondary | ICD-10-CM | POA: Diagnosis not present

## 2020-04-09 DIAGNOSIS — Z1211 Encounter for screening for malignant neoplasm of colon: Secondary | ICD-10-CM | POA: Diagnosis not present

## 2020-04-09 DIAGNOSIS — K6289 Other specified diseases of anus and rectum: Secondary | ICD-10-CM | POA: Diagnosis not present

## 2020-06-08 DIAGNOSIS — R531 Weakness: Secondary | ICD-10-CM | POA: Diagnosis not present

## 2020-06-08 DIAGNOSIS — Z20822 Contact with and (suspected) exposure to covid-19: Secondary | ICD-10-CM | POA: Diagnosis not present

## 2020-06-08 DIAGNOSIS — R5383 Other fatigue: Secondary | ICD-10-CM | POA: Diagnosis not present

## 2020-06-08 DIAGNOSIS — J029 Acute pharyngitis, unspecified: Secondary | ICD-10-CM | POA: Diagnosis not present

## 2020-06-08 DIAGNOSIS — Z87891 Personal history of nicotine dependence: Secondary | ICD-10-CM | POA: Diagnosis not present

## 2020-06-15 DIAGNOSIS — R102 Pelvic and perineal pain: Secondary | ICD-10-CM | POA: Diagnosis not present

## 2020-07-02 DIAGNOSIS — E6609 Other obesity due to excess calories: Secondary | ICD-10-CM | POA: Diagnosis not present

## 2020-07-02 DIAGNOSIS — I480 Paroxysmal atrial fibrillation: Secondary | ICD-10-CM | POA: Diagnosis not present

## 2020-07-02 DIAGNOSIS — N1831 Chronic kidney disease, stage 3a: Secondary | ICD-10-CM | POA: Diagnosis not present

## 2020-07-02 DIAGNOSIS — Z6831 Body mass index (BMI) 31.0-31.9, adult: Secondary | ICD-10-CM | POA: Diagnosis not present

## 2020-07-02 DIAGNOSIS — Z8673 Personal history of transient ischemic attack (TIA), and cerebral infarction without residual deficits: Secondary | ICD-10-CM | POA: Diagnosis not present

## 2020-07-02 DIAGNOSIS — E782 Mixed hyperlipidemia: Secondary | ICD-10-CM | POA: Diagnosis not present

## 2020-07-02 DIAGNOSIS — F330 Major depressive disorder, recurrent, mild: Secondary | ICD-10-CM | POA: Diagnosis not present

## 2020-07-02 DIAGNOSIS — E0590 Thyrotoxicosis, unspecified without thyrotoxic crisis or storm: Secondary | ICD-10-CM | POA: Diagnosis not present

## 2020-07-02 DIAGNOSIS — I130 Hypertensive heart and chronic kidney disease with heart failure and stage 1 through stage 4 chronic kidney disease, or unspecified chronic kidney disease: Secondary | ICD-10-CM | POA: Diagnosis not present

## 2020-07-02 DIAGNOSIS — I5032 Chronic diastolic (congestive) heart failure: Secondary | ICD-10-CM | POA: Diagnosis not present

## 2020-07-02 DIAGNOSIS — R7303 Prediabetes: Secondary | ICD-10-CM | POA: Diagnosis not present

## 2020-07-02 DIAGNOSIS — K219 Gastro-esophageal reflux disease without esophagitis: Secondary | ICD-10-CM | POA: Diagnosis not present

## 2020-07-28 ENCOUNTER — Ambulatory Visit (INDEPENDENT_AMBULATORY_CARE_PROVIDER_SITE_OTHER): Payer: PPO

## 2020-07-28 ENCOUNTER — Ambulatory Visit: Payer: PPO | Admitting: Orthopaedic Surgery

## 2020-07-28 ENCOUNTER — Encounter: Payer: Self-pay | Admitting: Orthopaedic Surgery

## 2020-07-28 VITALS — BP 155/85 | HR 73

## 2020-07-28 DIAGNOSIS — Z96641 Presence of right artificial hip joint: Secondary | ICD-10-CM

## 2020-07-28 DIAGNOSIS — M79605 Pain in left leg: Secondary | ICD-10-CM

## 2020-07-28 DIAGNOSIS — Z981 Arthrodesis status: Secondary | ICD-10-CM

## 2020-07-28 NOTE — Progress Notes (Signed)
Office Visit Note   Patient: Christina Booker           Date of Birth: 1941-02-04           MRN: [id number] Visit Date: 07/28/2020              Requested by: Raina Mina., MD [facility] [place],  [place] [address] PCP: Raina Mina., MD   Assessment & Plan: Visit Diagnoses:  1. S/P lumbar fusion   2. Pain in left leg   3. History of total right hip replacement     Plan: Lumbar spine looks unchanged from 2020.  Hips doing well.  Her symptoms are intermittent and episodic completely resolved not increasing.  No evidence of tendinopathy in the leg or ankle.  She can return if she has increased symptoms.  Follow-Up Instructions: Return if symptoms worsen or fail to improve.   Orders:  Orders Placed This Encounter  Procedures  . XR HIP UNILAT W OR W/O PELVIS 2-3 VIEWS LEFT  . XR Lumbar Spine 2-3 Views   No orders of the defined types were placed in this encounter.     Procedures: No procedures performed   Clinical Data: No additional findings.   Subjective: Chief Complaint  Patient presents with  . Left Leg - Pain    HPI 80 year old female previous right total hip arthroplasty and lumbar instrumented fusion L3-L5 is seen with left ankle pain intermittent sometimes shoots from her ankle up to her knee not associated with any particular back pain.  She fell last Saturday landing on the right side.  She not noticed any stiffness in her ankle no injury to the left ankle.  She thinks it might sometimes look slightly swollen.  She has had good range of motion of it.  Denies associated left knee pain.  She is happy with results of her hip and lumbar fusion no neurogenic claudication symptoms currently.  Review of Systems reviewed updated unchanged.   Objective: Vital Signs: BP (!) 155/85   Pulse 73   Physical Exam Constitutional:      Appearance: She is well-developed.  HENT:     Head: Normocephalic.     Right Ear: External ear normal.     Left Ear:  External ear normal.  Eyes:     Pupils: Pupils are equal, round, and reactive to light.  Neck:     Thyroid: No thyromegaly.     Trachea: No tracheal deviation.  Cardiovascular:     Rate and Rhythm: Normal rate.  Pulmonary:     Effort: Pulmonary effort is normal.  Abdominal:     Palpations: Abdomen is soft.  Skin:    General: Skin is warm and dry.  Neurological:     Mental Status: She is alert and oriented to person, place, and time.  Psychiatric:        Behavior: Behavior normal.     Ortho Exam full ankle range of motion negative anterior drawer tib-fib ligaments normal posterior tibial peroneals anterior tib EHL all normal no sciatic notch tenderness no trochanteric bursa tenderness knee reaches full extension.  Pedal pulses normal.  Specialty Comments:  No specialty comments available.  Imaging: XR HIP UNILAT W OR W/O PELVIS 2-3 VIEWS LEFT  Result Date: 07/29/2020 AP pelvis frog-leg right hip obtained and reviewed.  This shows well-positioned total knee arthroplasty.  Left hip shows no significant degenerative changes.  Pelvis is normal.  Instrumented lumbar fusion visualized better on lumbar images. Impression:  Previous right total hip arthroplasty.  Good position alignment.  XR Lumbar Spine 2-3 Views  Result Date: 07/29/2020 AP lateral lumbar spine images are obtained comparison to 11/06/2018 images.  Instrumented fusion L3-L5 without change.  Retrolisthesis and spurring unchanged at L2-3 and L1-2.  Negative for acute compression fractures. Impression: Previous L3-L5 fusion.  Adjacent level degenerative changes as described above.    PMFS History: Patient Active Problem List   Diagnosis Date Noted  . Trigger finger, right ring finger 11/06/2018  . History of total right hip replacement 03/20/2018  . PONV (postoperative nausea and vomiting) 11/21/2016  . Lumbar stenosis 11/21/2016  . History of fusion of cervical spine 10/11/2016  . Cervical spinal stenosis 09/07/2016   . Spinal stenosis of lumbar region with neurogenic claudication 06/01/2016  . Cervical radiculopathy 05/12/2016  . Spinal stenosis of cervical region 05/12/2016  . Cervicalgia 05/12/2016  . Ganglion of left hand 12/22/2014  . S/P lumbar fusion 04/16/2014   Past Medical History:  Diagnosis Date  . Arthritis    hands, back  . Arthritis of right hip   . Cervical spinal stenosis   . Complication of anesthesia   . Constipation   . Headache   . Hot flashes   . Hyperlipidemia   . Hypertension   . Hyperthyroidism    current problem  . Irritable bowel syndrome   . PAF (paroxysmal atrial fibrillation) ([place])    declined asnticoagulation  . PONV (postoperative nausea and vomiting)   . Rosacea   . Stroke [facility]) 12/01/2009   tia  2010    Family History  Problem Relation Age of Onset  . Cancer Mother   . Uterine cancer Mother   . Hypertension Mother   . Heart attack Father     Past Surgical History:  Procedure Laterality Date  . ABDOMINAL HYSTERECTOMY    . ANTERIOR CERVICAL DECOMP/DISCECTOMY FUSION N/A 09/07/2016   Procedure: C7-T1 Anterior Cervical Discectomy and Fusion, Allograft, Plate;  Surgeon: Marybelle Killings, MD;  Location: [facility];  Service: Orthopedics;  Laterality: N/A;  . BACK SURGERY    . CARPAL TUNNEL RELEASE Right 2009  . CARPAL TUNNEL RELEASE Left   . CATARACT EXTRACTION, BILATERAL    . CERVICAL DISCECTOMY  09/07/2016   C7-T1 Anterior Cervical Discectomy and Fusion, Allograft, Plate (N/A)  . CHOLECYSTECTOMY    . EYE SURGERY    . GANGLION CYST EXCISION Left 12/22/2014   Procedure: Excision Left Hand Ganglion cyst;  Surgeon: Marybelle Killings, MD;  Location: [place];  Service: Orthopedics;  Laterality: Left;  . HEMORRHOID SURGERY    . TOTAL HIP ARTHROPLASTY Right 01/26/2018   Procedure: RIGHT TOTAL HIP ARTHROPLASTY ANTERIOR APPROACH;  Surgeon: Marybelle Killings, MD;  Location: [place];  Service: Orthopedics;  Laterality: Right;  . TUBAL LIGATION     Social  History   Occupational History  . Not on file  Tobacco Use  . Smoking status: Former Smoker    Quit date: 04/09/1992    Years since quitting: 28.3  . Smokeless tobacco: Never Used  Vaping Use  . Vaping Use: Never used  Substance and Sexual Activity  . Alcohol use: Yes    Alcohol/week: 4.0 standard drinks    Types: 4 Glasses of wine per week    Comment: 4  . Drug use: No  . Sexual activity: Not on file

## 2020-10-05 DIAGNOSIS — E0590 Thyrotoxicosis, unspecified without thyrotoxic crisis or storm: Secondary | ICD-10-CM | POA: Diagnosis not present

## 2020-10-05 DIAGNOSIS — E0510 Thyrotoxicosis with toxic single thyroid nodule without thyrotoxic crisis or storm: Secondary | ICD-10-CM | POA: Diagnosis not present

## 2020-10-12 DIAGNOSIS — E0520 Thyrotoxicosis with toxic multinodular goiter without thyrotoxic crisis or storm: Secondary | ICD-10-CM | POA: Diagnosis not present

## 2021-01-05 DIAGNOSIS — M5136 Other intervertebral disc degeneration, lumbar region: Secondary | ICD-10-CM | POA: Diagnosis not present

## 2021-01-05 DIAGNOSIS — R7303 Prediabetes: Secondary | ICD-10-CM | POA: Diagnosis not present

## 2021-01-05 DIAGNOSIS — I480 Paroxysmal atrial fibrillation: Secondary | ICD-10-CM | POA: Diagnosis not present

## 2021-01-05 DIAGNOSIS — F330 Major depressive disorder, recurrent, mild: Secondary | ICD-10-CM | POA: Diagnosis not present

## 2021-01-05 DIAGNOSIS — I130 Hypertensive heart and chronic kidney disease with heart failure and stage 1 through stage 4 chronic kidney disease, or unspecified chronic kidney disease: Secondary | ICD-10-CM | POA: Diagnosis not present

## 2021-01-05 DIAGNOSIS — E0590 Thyrotoxicosis, unspecified without thyrotoxic crisis or storm: Secondary | ICD-10-CM | POA: Diagnosis not present

## 2021-01-05 DIAGNOSIS — Z8673 Personal history of transient ischemic attack (TIA), and cerebral infarction without residual deficits: Secondary | ICD-10-CM | POA: Diagnosis not present

## 2021-01-05 DIAGNOSIS — E782 Mixed hyperlipidemia: Secondary | ICD-10-CM | POA: Diagnosis not present

## 2021-01-05 DIAGNOSIS — I5032 Chronic diastolic (congestive) heart failure: Secondary | ICD-10-CM | POA: Diagnosis not present

## 2021-01-05 DIAGNOSIS — I700 Atherosclerosis of aorta: Secondary | ICD-10-CM | POA: Diagnosis not present

## 2021-01-05 DIAGNOSIS — N1831 Chronic kidney disease, stage 3a: Secondary | ICD-10-CM | POA: Diagnosis not present

## 2021-01-05 DIAGNOSIS — E6609 Other obesity due to excess calories: Secondary | ICD-10-CM | POA: Diagnosis not present

## 2021-01-05 DIAGNOSIS — Z0000 Encounter for general adult medical examination without abnormal findings: Secondary | ICD-10-CM | POA: Diagnosis not present

## 2021-01-05 DIAGNOSIS — G3184 Mild cognitive impairment, so stated: Secondary | ICD-10-CM | POA: Diagnosis not present

## 2021-02-16 DIAGNOSIS — L570 Actinic keratosis: Secondary | ICD-10-CM | POA: Diagnosis not present

## 2021-02-16 DIAGNOSIS — L821 Other seborrheic keratosis: Secondary | ICD-10-CM | POA: Diagnosis not present

## 2022-06-20 ENCOUNTER — Encounter: Payer: Self-pay | Admitting: Orthopaedic Surgery

## 2022-06-20 ENCOUNTER — Ambulatory Visit: Payer: PPO | Admitting: Orthopaedic Surgery

## 2022-06-20 VITALS — BP 150/82 | HR 69 | Ht 60.0 in | Wt 158.0 lb

## 2022-06-20 DIAGNOSIS — M1711 Unilateral primary osteoarthritis, right knee: Secondary | ICD-10-CM | POA: Insufficient documentation

## 2022-06-20 MED ORDER — BUPIVACAINE HCL 0.25 % IJ SOLN
4.0000 mL | INTRAMUSCULAR | Status: AC | PRN
Start: 2022-06-20 — End: 2022-06-20
  Administered 2022-06-20: 4 mL via INTRA_ARTICULAR

## 2022-06-20 MED ORDER — LIDOCAINE HCL 1 % IJ SOLN
0.5000 mL | INTRAMUSCULAR | Status: AC | PRN
Start: 2022-06-20 — End: 2022-06-20
  Administered 2022-06-20: .5 mL

## 2022-06-20 MED ORDER — METHYLPREDNISOLONE ACETATE 40 MG/ML IJ SUSP
40.0000 mg | INTRAMUSCULAR | Status: AC | PRN
Start: 2022-06-20 — End: 2022-06-20
  Administered 2022-06-20: 40 mg via INTRA_ARTICULAR

## 2022-06-20 NOTE — Progress Notes (Signed)
Office Visit Note   Patient: Christina Booker           Date of Birth: [birth year]           MRN: [id number] Visit Date: 06/20/2022              Requested by: Gordan Payment., MD [address] [place],  [place] [address] PCP: Gordan Payment., MD   Assessment & Plan: Visit Diagnoses:  1. Primary osteoarthritis of right knee     Plan: Patient was synovitis with mild OA by knee x-ray with 2 episodes where she is not able to walk without a walker.  Knee injection performed we will see how she does and recheck her in 3 weeks.  She has persistent symptoms she will need diagnostic MRI imaging to rule out osteochondral flap tear versus meniscal tear causing her knee locking.  Follow-Up Instructions: Return in about 3 weeks (around 07/11/2022).   Orders:  Orders Placed This Encounter  Procedures   Large Joint Inj   No orders of the defined types were placed in this encounter.     Procedures: Large Joint Inj: R knee on 06/20/2022 10:17 AM Indications: pain and joint swelling Details: 22 G 1.5 in needle, anterolateral approach  Arthrogram: No  Medications: 40 mg methylPREDNISolone acetate 40 MG/ML; 0.5 mL lidocaine 1 %; 4 mL bupivacaine 0.25 % Outcome: tolerated well, no immediate complications Procedure, treatment alternatives, risks and benefits explained, specific risks discussed. Consent was given by the patient. Immediately prior to procedure a time out was called to verify the correct patient, procedure, equipment, support staff and site/side marked as required. Patient was prepped and draped in the usual sterile fashion.       Clinical Data: No additional findings.   Subjective: Chief Complaint  Patient presents with   Right Knee - Pain    HPI 82 year old female seen with onset 2 weeks ago right knee pain and swelling with subtle locking and inability to ambulate.  She had to use the walker.  She has had previous hip arthroplasty by me lumbar fusion L3-4 and L4-5 in  the past.  Cervical fusion.  Her knee pain was sudden she states the following few days she was 90% better doing well until this weekend when again she had sharp pain and had to use the walker.  Today she is using a cane.  She states the knee folic it was stuck and she points to the medial joint line where she is having pain.  She had x-rays not available for review but I do have the report and it showed mild medial and patellofemoral arthritic changes.  Review of Systems all the systems noncontributory to HPI.   Objective: Vital Signs: BP (!) 150/82   Pulse 69   Ht 5' (1.524 m)   Wt 158 lb (71.7 kg)   BMI 30.86 kg/m   Physical Exam Constitutional:      Appearance: She is well-developed.  HENT:     Head: Normocephalic.     Right Ear: External ear normal.     Left Ear: External ear normal. There is no impacted cerumen.  Eyes:     Pupils: Pupils are equal, round, and reactive to light.  Neck:     Thyroid: No thyromegaly.     Trachea: No tracheal deviation.  Cardiovascular:     Rate and Rhythm: Normal rate.  Pulmonary:     Effort: Pulmonary effort is normal.  Abdominal:  Palpations: Abdomen is soft.  Musculoskeletal:     Cervical back: No rigidity.  Skin:    General: Skin is warm and dry.  Neurological:     Mental Status: She is alert and oriented to person, place, and time.  Psychiatric:        Behavior: Behavior normal.     Ortho Exam mild effusion right knee.  Medial joint line tenderness more than lateral.  Collateral ligaments are stable ACL PCL exam is normal.  Some patellofemoral crepitus.  Some pain with hyperextension.  She is able to flex to 110 degrees.  Specialty Comments:  No specialty comments available.  Imaging: ild degenerative changes with small effusion.   Electronically Signed   By: Jasmine PangKim  Fujinaga M.D.   On: 06/15/2022 18:35 Narrative  CLINICAL DATA:  Knee pain  EXAM: RIGHT KNEE - COMPLETE 4+ VIEW  COMPARISON:  None  Available.  FINDINGS: No fracture or malalignment. Mild medial and patellofemoral degenerative change. Small knee effusion Procedure Note  Windell MomentFujinaga, Kim Mika, MD - 06/15/2022 Formatting of this note might be different from the original. CLINICAL DATA:  Knee pain  EXAM: RIGHT KNEE - COMPLETE 4+ VIEW  COMPARISON:  None Available.  FINDINGS: No fracture or malalignment. Mild medial and patellofemoral degenerative change. Small knee effusion  IMPRESSION: Mild degenerative changes with small effusion.   Electronically Signed   By: Jasmine PangKim  Fujinaga M.D.   On: 06/15/2022 18:35  Exam End: 06/14/22 11:33  z   PMFS History: Patient Active Problem List   Diagnosis Date Noted   Primary osteoarthritis of right knee 06/20/2022   Trigger finger, right ring finger 11/06/2018   History of total right hip replacement 03/20/2018   PONV (postoperative nausea and vomiting) 11/21/2016   Lumbar stenosis 11/21/2016   History of fusion of cervical spine 10/11/2016   Cervical spinal stenosis 09/07/2016   Spinal stenosis of lumbar region with neurogenic claudication 06/01/2016   Cervical radiculopathy 05/12/2016   Spinal stenosis of cervical region 05/12/2016   Cervicalgia 05/12/2016   Ganglion of left hand 12/22/2014   S/P lumbar fusion 04/16/2014   Past Medical History:  Diagnosis Date   Arthritis    hands, back   Arthritis of right hip    Cervical spinal stenosis    Complication of anesthesia    Constipation    Headache    Hot flashes    Hyperlipidemia    Hypertension    Hyperthyroidism    current problem   Irritable bowel syndrome    PAF (paroxysmal atrial fibrillation)    declined asnticoagulation   PONV (postoperative nausea and vomiting)    Rosacea    Stroke 12/01/2009   tia  2010    Family History  Problem Relation Age of Onset   Cancer Mother    Uterine cancer Mother    Hypertension Mother    Heart attack Father     Past Surgical History:  Procedure Laterality  Date   ABDOMINAL HYSTERECTOMY     ANTERIOR CERVICAL DECOMP/DISCECTOMY FUSION N/A 09/07/2016   Procedure: C7-T1 Anterior Cervical Discectomy and Fusion, Allograft, Plate;  Surgeon: Eldred MangesYates, Stephaun Million C, MD;  Location[facility];  Service: Orthopedics;  Laterality: N/A;   BACK SURGERY     CARPAL TUNNEL RELEASE Right 2009   CARPAL TUNNEL RELEASE Left    CATARACT EXTRACTION, BILATERAL     CERVICAL DISCECTOMY  09/07/2016   C7-T1 Anterior Cervical Discectomy and Fusion, Allograft, Plate (N/A)   CHOLECYSTECTOMY     EYE SURGERY  GANGLION CYST EXCISION Left 12/22/2014   Procedure: Excision Left Hand Ganglion cyst;  Surgeon: Eldred Manges, MD;  Location: [facility];  Service: Orthopedics;  Laterality: Left;   HEMORRHOID SURGERY     TOTAL HIP ARTHROPLASTY Right 01/26/2018   Procedure: RIGHT TOTAL HIP ARTHROPLASTY ANTERIOR APPROACH;  Surgeon: Eldred Manges, MD;  Location[facility];  Service: Orthopedics;  Laterality: Right;   TUBAL LIGATION     Social History   Occupational History   Not on file  Tobacco Use   Smoking status: Former    Types: Cigarettes    Quit date: 04/09/1992    Years since quitting: 30.2   Smokeless tobacco: Never  Vaping Use   Vaping Use: Never used  Substance and Sexual Activity   Alcohol use: Yes    Alcohol/week: 4.0 standard drinks of alcohol    Types: 4 Glasses of wine per week    Comment: 4   Drug use: No   Sexual activity: Not on file

## 2022-06-23 ENCOUNTER — Telehealth: Payer: Self-pay | Admitting: Orthopaedic Surgery

## 2022-06-23 ENCOUNTER — Other Ambulatory Visit: Payer: Self-pay | Admitting: Orthopaedic Surgery

## 2022-06-23 DIAGNOSIS — M1711 Unilateral primary osteoarthritis, right knee: Secondary | ICD-10-CM

## 2022-06-23 MED ORDER — TRAMADOL HCL 50 MG PO TABS: 50.0000 mg | ORAL_TABLET | Freq: Two times a day (BID) | ORAL | 0 refills | Status: DC | PRN

## 2022-06-23 NOTE — Telephone Encounter (Signed)
Patient advised she is still in pain and not able to walk and wants to know what to do about getting an MRI and something else for pain. Please call patient, please call--[phone]

## 2022-06-23 NOTE — Telephone Encounter (Signed)
Please advise 

## 2022-06-23 NOTE — Addendum Note (Signed)
Addended by: Rogers Seeds on: 06/23/2022 04:42 PM   Modules accepted: Orders

## 2022-06-23 NOTE — Telephone Encounter (Signed)
Patient called and spoke with Cassandra. Advised tramadol was sent to pharmacy. MRI ordered.

## 2022-06-24 ENCOUNTER — Telehealth: Payer: Self-pay | Admitting: Orthopaedic Surgery

## 2022-06-24 NOTE — Telephone Encounter (Signed)
Patient asking if someone can call in some pain medication and that she called yesterday and did not get a call back. Please advise

## 2022-06-24 NOTE — Telephone Encounter (Signed)
I called patient and advised. She has MRI scheduled for Sunday morning. I made follow up appt for Tuesday to review.

## 2022-06-24 NOTE — Telephone Encounter (Signed)
I called patient and advised tramadol was sent in yesterday. Also advised it looks like MRI is authorized so she can call [facility] to schedule. She states that she took the tramadol yesterday and it did not relieve the pain, just made her sleep. She has prednisone 10mg  that was her husbands and would like to know if you think that would help at all. She is having a lot of pain. Please advise.

## 2022-06-26 ENCOUNTER — Ambulatory Visit
Admission: RE | Admit: 2022-06-26 | Discharge: 2022-06-26 | Disposition: A | Discharge status: Home / Self Care | Payer: PPO | Source: Ambulatory Visit | Attending: Orthopaedic Surgery | Admitting: Orthopaedic Surgery

## 2022-06-26 DIAGNOSIS — M1711 Unilateral primary osteoarthritis, right knee: Secondary | ICD-10-CM

## 2022-06-28 ENCOUNTER — Ambulatory Visit: Payer: PPO | Admitting: Orthopaedic Surgery

## 2022-06-28 ENCOUNTER — Encounter: Payer: Self-pay | Admitting: Orthopaedic Surgery

## 2022-06-28 VITALS — BP 133/80 | HR 67 | Ht 60.0 in | Wt 158.0 lb

## 2022-06-28 DIAGNOSIS — M84361A Stress fracture, right tibia, initial encounter for fracture: Secondary | ICD-10-CM

## 2022-06-28 DIAGNOSIS — M1711 Unilateral primary osteoarthritis, right knee: Secondary | ICD-10-CM | POA: Diagnosis not present

## 2022-06-28 DIAGNOSIS — M8430XA Stress fracture, unspecified site, initial encounter for fracture: Secondary | ICD-10-CM | POA: Insufficient documentation

## 2022-06-28 NOTE — Progress Notes (Signed)
Office Visit Note   Patient: Christina Booker           Date of Birth: April 14, 1940           MRN: [id number] Visit Date: 06/28/2022              Requested by: Gordan Payment., MD [address] [place],  [place] [address] PCP: Gordan Payment., MD   Assessment & Plan: Visit Diagnoses:  1. Primary osteoarthritis of right knee   2. Stress fracture of right tibia, initial encounter     Plan: 60-month temporary handicap sticker given.  She uses her walker try to protective weight-bear and stay off her right knee keep it elevated she can continue use some ice off-and-on.  Reviewed the MRI gave her a copy of the report.  I will check her in 3 weeks.  She may require total knee arthroplasty but might get resolution of her symptoms if she can unload the medial tibial plateau and allow the stress fracture to heal.  Follow-Up Instructions: Return in about 3 weeks (around 07/19/2022).   Orders:  No orders of the defined types were placed in this encounter.  No orders of the defined types were placed in this encounter.     Procedures: No procedures performed   Clinical Data: No additional findings.   Subjective: Chief Complaint  Patient presents with   Right Knee - Pain, Follow-up    MRI review    HPI 82 year old female returns with ongoing right knee severe pain over the pes bursa medial compartment.  She is having use a walker has significant pain with weightbearing gets relief with sitting or supine position.  Knee aches a lot if she has been active and her daughter is present today and states she has been extremely active recently.  MRI scan shows changes consistent with stress reaction/subchondral stress fracture medial tibial plateau.  She does have areas of full-thickness cartilage loss patellofemoral and medial compartment as well but had been doing well several months ago until she suddenly started having increased medial joint line pain.  Review of  Systems   Objective: Vital Signs: BP 133/80   Pulse 67   Ht 5' (1.524 m)   Wt 158 lb (71.7 kg)   BMI 30.86 kg/m   Physical Exam  Ortho Exam  Specialty Comments:  No specialty comments available.  Imaging: CLINICAL DATA:  Right knee catching, difficulty walking.   EXAM: MRI OF THE RIGHT KNEE WITHOUT CONTRAST   TECHNIQUE: Multiplanar, multisequence MR imaging of the right knee was performed. No intravenous contrast was administered.   COMPARISON:  Radiographs dated June 14, 2022   FINDINGS: MENISCI   Medial: Complex degenerative tear of the posterior horn of the medial meniscus.   Lateral: Intact.   LIGAMENTS   Cruciates: ACL and PCL are intact.   Collaterals: Medial collateral ligament is intact. Lateral collateral ligament complex is intact.   CARTILAGE   Patellofemoral: Near complete loss of the articular cartilage with subchondral cystic changes of the patella.   Medial:  Full-thickness cartilage loss at the weight-bearing area.   Lateral:  No chondral defect.   JOINT: Moderate joint effusion. Normal Hoffa's fat-pad. Mild plical thickening.   POPLITEAL FOSSA: Popliteus tendon is intact. No Baker's cyst.   EXTENSOR MECHANISM: Intact quadriceps tendon. Intact patellar tendon. Intact lateral patellar retinaculum. Intact medial patellar retinaculum. Intact MPFL.   BONES: Small subchondral fracture in the medial tibial plateau with surrounding edema concerning for  insufficiency fracture. Tricompartmental marginal osteophytes.   Other: No fluid collection or hematoma. Muscles are normal.   IMPRESSION: 1. Small subchondral fracture of the medial tibial plateau with surrounding edema concerning for insufficiency fracture. 2. Moderate tricompartmental osteoarthritis, severe in the medial tibiofemoral and patellofemoral compartments with complex degenerative tear of the posterior horn of the medial meniscus and full-thickness cartilage loss at the  weight-bearing area. 3. Cruciate and collateral ligaments are intact. Quadriceps tendon and patellar tendon are also maintained. 4. Moderate suprapatellar joint effusion.     Electronically Signed   By: Larose Hires D.O.   On: 06/28/2022 10:10   PMFS History: Patient Active Problem List   Diagnosis Date Noted   Stress fracture 06/28/2022   Primary osteoarthritis of right knee 06/20/2022   Trigger finger, right ring finger 11/06/2018   History of total right hip replacement 03/20/2018   PONV (postoperative nausea and vomiting) 11/21/2016   Lumbar stenosis 11/21/2016   History of fusion of cervical spine 10/11/2016   Cervical spinal stenosis 09/07/2016   Spinal stenosis of lumbar region with neurogenic claudication 06/01/2016   Cervical radiculopathy 05/12/2016   Spinal stenosis of cervical region 05/12/2016   Cervicalgia 05/12/2016   Ganglion of left hand 12/22/2014   S/P lumbar fusion 04/16/2014   Past Medical History:  Diagnosis Date   Arthritis    hands, back   Arthritis of right hip    Cervical spinal stenosis    Complication of anesthesia    Constipation    Headache    Hot flashes    Hyperlipidemia    Hypertension    Hyperthyroidism    current problem   Irritable bowel syndrome    PAF (paroxysmal atrial fibrillation)    declined asnticoagulation   PONV (postoperative nausea and vomiting)    Rosacea    Stroke 12/01/2009   tia  2010    Family History  Problem Relation Age of Onset   Cancer Mother    Uterine cancer Mother    Hypertension Mother    Heart attack Father     Past Surgical History:  Procedure Laterality Date   ABDOMINAL HYSTERECTOMY     ANTERIOR CERVICAL DECOMP/DISCECTOMY FUSION N/A 09/07/2016   Procedure: C7-T1 Anterior Cervical Discectomy and Fusion, Allograft, Plate;  Surgeon: Eldred Manges, MD;  Location[facility];  Service: Orthopedics;  Laterality: N/A;   BACK SURGERY     CARPAL TUNNEL RELEASE Right 2009   CARPAL TUNNEL RELEASE Left     CATARACT EXTRACTION, BILATERAL     CERVICAL DISCECTOMY  09/07/2016   C7-T1 Anterior Cervical Discectomy and Fusion, Allograft, Plate (N/A)   CHOLECYSTECTOMY     EYE SURGERY     GANGLION CYST EXCISION Left 12/22/2014   Procedure: Excision Left Hand Ganglion cyst;  Surgeon: Eldred Manges, MD;  Location: [facility];  Service: Orthopedics;  Laterality: Left;   HEMORRHOID SURGERY     TOTAL HIP ARTHROPLASTY Right 01/26/2018   Procedure: RIGHT TOTAL HIP ARTHROPLASTY ANTERIOR APPROACH;  Surgeon: Eldred Manges, MD;  Location: [facility];  Service: Orthopedics;  Laterality: Right;   TUBAL LIGATION     Social History   Occupational History   Not on file  Tobacco Use   Smoking status: Former    Types: Cigarettes    Quit date: 04/09/1992    Years since quitting: 30.2   Smokeless tobacco: Never  Vaping Use   Vaping Use: Never used  Substance and Sexual Activity   Alcohol use: Yes  Alcohol/week: 4.0 standard drinks of alcohol    Types: 4 Glasses of wine per week    Comment: 4   Drug use: No   Sexual activity: Not on file

## 2022-07-12 ENCOUNTER — Ambulatory Visit: Payer: PPO | Admitting: Orthopaedic Surgery

## 2022-07-19 ENCOUNTER — Ambulatory Visit: Payer: PPO | Admitting: Orthopaedic Surgery

## 2022-07-19 ENCOUNTER — Encounter: Payer: Self-pay | Admitting: Orthopaedic Surgery

## 2022-07-19 VITALS — BP 156/77 | HR 91 | Ht 60.0 in | Wt 158.0 lb

## 2022-07-19 DIAGNOSIS — M84361A Stress fracture, right tibia, initial encounter for fracture: Secondary | ICD-10-CM | POA: Diagnosis not present

## 2022-07-21 NOTE — Progress Notes (Signed)
Office Visit Note   Patient: Christina Booker           Date of Birth: 07/27/1940           MRN: [id number] Visit Date: 07/19/2022              Requested by: Gordan Payment., MD [address] [place],  [place] [address] PCP: Gordan Payment., MD   Assessment & Plan: Visit Diagnoses:  1. Stress fracture of right tibia, initial encounter     Plan: Her symptoms have significantly improved with time.  We cautioned her about trying not to ramp her activity level up at a rapid pace to avoid reaggravating her problem if she slowly increases her activities over several months she should be able to get back to anything she like to do.  Return as needed.  Follow-Up Instructions: No follow-ups on file.   Orders:  No orders of the defined types were placed in this encounter.  No orders of the defined types were placed in this encounter.     Procedures: No procedures performed   Clinical Data: No additional findings.   Subjective: Chief Complaint  Patient presents with   Right Knee - Fracture, Follow-up    HPI 82 year old female well-known for many years post lumbar decompression fusion.  Cervical fusion.  Few years later patient had adjacent level stenosis and had L3-4 fused above her solid 4-5 fusion.  Right total of arthroplasty 2019.  She is here concerning ongoing problems with right knee pain and workup after failed medications and injection included an MRI on 06/26/2022 which showed small subchondral fracture medial tibial plateau with edema consistent with insufficiency fracture.  Patient had moderate tricompartmental arthritis.  In the last month she states her pain is gotten significantly better and she got rid of the walker last week and is walking without it.  After her husband's death she was extremely active, doing a lot of cleaning do a lot of walking being on her feet multiple places etc.  Review of Systems updated unchanged   Objective: Vital Signs: BP (!) 156/77    Pulse 91   Ht 5' (1.524 m)   Wt 158 lb (71.7 kg)   BMI 30.86 kg/m   Physical Exam Constitutional:      Appearance: She is well-developed.  HENT:     Head: Normocephalic.     Right Ear: External ear normal.     Left Ear: External ear normal. There is no impacted cerumen.  Eyes:     Pupils: Pupils are equal, round, and reactive to light.  Neck:     Thyroid: No thyromegaly.     Trachea: No tracheal deviation.  Cardiovascular:     Rate and Rhythm: Normal rate.  Pulmonary:     Effort: Pulmonary effort is normal.  Abdominal:     Palpations: Abdomen is soft.  Musculoskeletal:     Cervical back: No rigidity.  Skin:    General: Skin is warm and dry.  Neurological:     Mental Status: She is alert and oriented to person, place, and time.  Psychiatric:        Behavior: Behavior normal.     Ortho Exam trace tenderness medial tibial plateau region of the.  No knee effusion good knee range of motion she is amatory without significant limp.  Specialty Comments:  No specialty comments available.  Imaging: No results found.   PMFS History: Patient Active Problem List   Diagnosis Date  Noted   Stress fracture 06/28/2022   Primary osteoarthritis of right knee 06/20/2022   Trigger finger, right ring finger 11/06/2018   History of total right hip replacement 03/20/2018   PONV (postoperative nausea and vomiting) 11/21/2016   Lumbar stenosis 11/21/2016   History of fusion of cervical spine 10/11/2016   Cervical spinal stenosis 09/07/2016   Spinal stenosis of lumbar region with neurogenic claudication 06/01/2016   Cervical radiculopathy 05/12/2016   Spinal stenosis of cervical region 05/12/2016   Cervicalgia 05/12/2016   Ganglion of left hand 12/22/2014   S/P lumbar fusion 04/16/2014   Past Medical History:  Diagnosis Date   Arthritis    hands, back   Arthritis of right hip    Cervical spinal stenosis    Complication of anesthesia    Constipation    Headache    Hot  flashes    Hyperlipidemia    Hypertension    Hyperthyroidism    current problem   Irritable bowel syndrome    PAF (paroxysmal atrial fibrillation) (HCC)    declined asnticoagulation   PONV (postoperative nausea and vomiting)    Rosacea    Stroke (HCC) 12/01/2009   tia  2010    Family History  Problem Relation Age of Onset   Cancer Mother    Uterine cancer Mother    Hypertension Mother    Heart attack Father     Past Surgical History:  Procedure Laterality Date   ABDOMINAL HYSTERECTOMY     ANTERIOR CERVICAL DECOMP/DISCECTOMY FUSION N/A 09/07/2016   Procedure: C7-T1 Anterior Cervical Discectomy and Fusion, Allograft, Plate;  Surgeon: Eldred Manges, MD;  Location: [facility];  Service: Orthopedics;  Laterality: N/A;   BACK SURGERY     CARPAL TUNNEL RELEASE Right 2009   CARPAL TUNNEL RELEASE Left    CATARACT EXTRACTION, BILATERAL     CERVICAL DISCECTOMY  09/07/2016   C7-T1 Anterior Cervical Discectomy and Fusion, Allograft, Plate (N/A)   CHOLECYSTECTOMY     EYE SURGERY     GANGLION CYST EXCISION Left 12/22/2014   Procedure: Excision Left Hand Ganglion cyst;  Surgeon: Eldred Manges, MD;  Location: [facility];  Service: Orthopedics;  Laterality: Left;   HEMORRHOID SURGERY     TOTAL HIP ARTHROPLASTY Right 01/26/2018   Procedure: RIGHT TOTAL HIP ARTHROPLASTY ANTERIOR APPROACH;  Surgeon: Eldred Manges, MD;  Location: [facility];  Service: Orthopedics;  Laterality: Right;   TUBAL LIGATION     Social History   Occupational History   Not on file  Tobacco Use   Smoking status: Former    Types: Cigarettes    Quit date: 04/09/1992    Years since quitting: 30.3   Smokeless tobacco: Never  Vaping Use   Vaping Use: Never used  Substance and Sexual Activity   Alcohol use: Yes    Alcohol/week: 4.0 standard drinks of alcohol    Types: 4 Glasses of wine per week    Comment: 4   Drug use: No   Sexual activity: Not on file

## 2022-08-22 ENCOUNTER — Telehealth: Payer: Self-pay | Admitting: Orthopaedic Surgery

## 2022-08-22 NOTE — Telephone Encounter (Signed)
Patient called asked for a call back concerning the pain she is experiencing in her left upper arm and lower back. The number to contact patient is [phone]

## 2022-08-23 NOTE — Telephone Encounter (Signed)
I called patient. She is experiencing pain in her arm and around her scapula. She made appointment with Dr. Ophelia Charter for 09/13/2022, but wonders if she may be able to see someone else sooner. Appointment made with [facility] for next week. We will leave appointment with Dr. Ophelia Charter on 09/13/2022 in case Christina Booker would like for her to return to see him. Patient will cancel that appointment closer to scheduled date if it is not needed.

## 2022-08-30 ENCOUNTER — Other Ambulatory Visit (INDEPENDENT_AMBULATORY_CARE_PROVIDER_SITE_OTHER): Payer: PPO

## 2022-08-30 ENCOUNTER — Ambulatory Visit: Payer: PPO | Admitting: Physician Assistant

## 2022-08-30 ENCOUNTER — Encounter: Payer: Self-pay | Admitting: Physician Assistant

## 2022-08-30 DIAGNOSIS — M4802 Spinal stenosis, cervical region: Secondary | ICD-10-CM | POA: Diagnosis not present

## 2022-08-30 MED ORDER — GABAPENTIN 100 MG PO CAPS: 100.0000 mg | ORAL_CAPSULE | Freq: Three times a day (TID) | ORAL | 3 refills | Status: DC

## 2022-08-30 MED ORDER — TRAMADOL HCL 50 MG PO TABS: 50.0000 mg | ORAL_TABLET | Freq: Two times a day (BID) | ORAL | 0 refills | Status: DC | PRN

## 2022-08-30 NOTE — Progress Notes (Signed)
Office Visit Note   Patient: Christina Booker           Date of Birth: 1940-07-04           MRN: [id number] Visit Date: 08/30/2022              Requested by: Gordan Payment., MD [address] [place],  [place] [address] PCP: Gordan Payment., MD   Assessment & Plan: Visit Diagnoses:  1. Spinal stenosis of cervical region     Plan: Patient has a history of cervical and lumbar stenosis.  She is status post C7-T1 anterior fusion with Dr. Ophelia Charter in 2018.  She has done very well per her report.  She has had no injury but over the course the last month has noticed some what she describes as neuropathic type pain which she has had before the surgery in her posterior upper left arm.  She also describes some pain around her left scapula.  She has also had some intermittent weakness in her thumb and index finger but does have a history of carpal tunnel release.  I think most of this is progressive symptoms from her cervical radiculopathy.  She is neurologically intact.  She has been on a regime of 4 g of Tylenol and tramadol and she reports that this morning she woke up symptom-free.  She has taken gabapentin in the past which also could be helpful.  I like to start her on gabapentin.  Because of her age I have recommended decreasing her Tylenol dose to 3 g maximum rather than 4 g.  Can also take tramadol as needed.  She does have a follow-up with Dr. Ophelia Charter in a couple weeks that was already made.  I do not see any prep anything pressing with regards to her exam today.  Since she is much better today I would like her to try medical management that we discussed.  She will follow-up with him in 2 weeks  Follow-Up Instructions: No follow-ups on file.   Orders:  No orders of the defined types were placed in this encounter.  No orders of the defined types were placed in this encounter.     Procedures: No procedures performed   Clinical Data: No additional findings.   Subjective: No chief  complaint on file.   HPI Deniz is a pleasant 82 year old patient who is a patient of Dr. Ophelia Charter.  She presents today with a chief complaint of neuropathic pain in her posterior upper arm.  She is also noticed that she feels like her thumb and index finger are weaker.  Denies any injuries.  She is 6 years status post C7-T1 anterior cervical discectomy with fusion with Dr. Ophelia Charter.  She says the electrical type pain she is getting in her arm is similar to what she had prior to her fusion surgery.  Review of Systems  All other systems reviewed and are negative.    Objective: Vital Signs: There were no vitals taken for this visit.  Physical Exam Constitutional:      Appearance: Normal appearance.  Pulmonary:     Effort: Pulmonary effort is normal.  Skin:    General: Skin is warm and dry.  Neurological:     Mental Status: She is alert.     Ortho Exam Examination she has well-healed anterior surgical scar.  She has no reproduction of her symptoms with neck range of motion.  She has good equal biceps and triceps strength.  Good abductor strength.  She has good abduction and adduction of her fingers.  Sensation is intact in her fingers currently no erythema no point tenderness over her spine Specialty Comments:  No specialty comments available.  Imaging: No results found.   PMFS History: Patient Active Problem List   Diagnosis Date Noted   Stress fracture 06/28/2022   Primary osteoarthritis of right knee 06/20/2022   Trigger finger, right ring finger 11/06/2018   History of total right hip replacement 03/20/2018   PONV (postoperative nausea and vomiting) 11/21/2016   Lumbar stenosis 11/21/2016   History of fusion of cervical spine 10/11/2016   Cervical spinal stenosis 09/07/2016   Spinal stenosis of lumbar region with neurogenic claudication 06/01/2016   Cervical radiculopathy 05/12/2016   Spinal stenosis of cervical region 05/12/2016   Cervicalgia 05/12/2016   Ganglion of left  hand 12/22/2014   S/P lumbar fusion 04/16/2014   Past Medical History:  Diagnosis Date   Arthritis    hands, back   Arthritis of right hip    Cervical spinal stenosis    Complication of anesthesia    Constipation    Headache    Hot flashes    Hyperlipidemia    Hypertension    Hyperthyroidism    current problem   Irritable bowel syndrome    PAF (paroxysmal atrial fibrillation) (HCC)    declined asnticoagulation   PONV (postoperative nausea and vomiting)    Rosacea    Stroke (HCC) 12/01/2009   tia  2010    Family History  Problem Relation Age of Onset   Cancer Mother    Uterine cancer Mother    Hypertension Mother    Heart attack Father     Past Surgical History:  Procedure Laterality Date   ABDOMINAL HYSTERECTOMY     ANTERIOR CERVICAL DECOMP/DISCECTOMY FUSION N/A 09/07/2016   Procedure: C7-T1 Anterior Cervical Discectomy and Fusion, Allograft, Plate;  Surgeon: Eldred Manges, MD;  Location: [facility];  Service: Orthopedics;  Laterality: N/A;   BACK SURGERY     CARPAL TUNNEL RELEASE Right 2009   CARPAL TUNNEL RELEASE Left    CATARACT EXTRACTION, BILATERAL     CERVICAL DISCECTOMY  09/07/2016   C7-T1 Anterior Cervical Discectomy and Fusion, Allograft, Plate (N/A)   CHOLECYSTECTOMY     EYE SURGERY     GANGLION CYST EXCISION Left 12/22/2014   Procedure: Excision Left Hand Ganglion cyst;  Surgeon: Eldred Manges, MD;  Location: [facility];  Service: Orthopedics;  Laterality: Left;   HEMORRHOID SURGERY     TOTAL HIP ARTHROPLASTY Right 01/26/2018   Procedure: RIGHT TOTAL HIP ARTHROPLASTY ANTERIOR APPROACH;  Surgeon: Eldred Manges, MD;  Location: [facility];  Service: Orthopedics;  Laterality: Right;   TUBAL LIGATION     Social History   Occupational History   Not on file  Tobacco Use   Smoking status: Former    Types: Cigarettes    Quit date: 04/09/1992    Years since quitting: 30.4   Smokeless tobacco: Never  Vaping Use   Vaping Use: Never used  Substance and  Sexual Activity   Alcohol use: Yes    Alcohol/week: 4.0 standard drinks of alcohol    Types: 4 Glasses of wine per week    Comment: 4   Drug use: No   Sexual activity: Not on file

## 2022-09-13 ENCOUNTER — Ambulatory Visit: Payer: PPO | Admitting: Orthopaedic Surgery

## 2022-09-13 VITALS — BP 135/78 | Ht 60.0 in | Wt 158.0 lb

## 2022-09-13 DIAGNOSIS — Z981 Arthrodesis status: Secondary | ICD-10-CM | POA: Diagnosis not present

## 2022-09-13 NOTE — Progress Notes (Signed)
Office Visit Note   Patient: Christina Booker           Date of Birth: 01/16/1941           MRN: [id number] Visit Date: 09/13/2022              Requested by: Gordan Payment., MD [address] [place],  [place] [address] PCP: Gordan Payment., MD   Assessment & Plan: Visit Diagnoses:  1. History of fusion of cervical spine     Plan: She has gotten complete relief with gabapentin she can take it for few weeks and then slowly reduce the dose as long as she does not have recurrent symptoms.  She can follow-up with me as needed she has 3 refills are sent in.  Follow-Up Instructions: No follow-ups on file.   Orders:  No orders of the defined types were placed in this encounter.  No orders of the defined types were placed in this encounter.     Procedures: No procedures performed   Clinical Data: No additional findings.   Subjective: Chief Complaint  Patient presents with   Neck - Follow-up   Lower Back - Follow-up    HPI 82 year old female seen with previous neck pain that radiated into her left arm.  C7-T1 fusion done 2018.  Prior to that she had lumbar fusions and cemented x 2 also right total of arthroplasty done by me doing well.  She was started on some gabapentin by [facility] Persons PA and has gotten good relief and states she is not symptomatic at all at this point.  Her previous x-rays demonstrated multilevel spondylosis but she had a significant compression back in 2008 at the time of surgery.  Neck incision is immediately at the level of the collarbone.  Review of Systems all other systems negative.   Objective: Vital Signs: BP 135/78   Ht 5' (1.524 m)   Wt 158 lb (71.7 kg)   BMI 30.86 kg/m   Physical Exam Constitutional:      Appearance: She is well-developed.  HENT:     Head: Normocephalic.     Right Ear: External ear normal.     Left Ear: External ear normal. There is no impacted cerumen.  Eyes:     Pupils: Pupils are equal, round, and reactive  to light.  Neck:     Thyroid: No thyromegaly.     Trachea: No tracheal deviation.  Cardiovascular:     Rate and Rhythm: Normal rate.  Pulmonary:     Effort: Pulmonary effort is normal.  Abdominal:     Palpations: Abdomen is soft.  Musculoskeletal:     Cervical back: No rigidity.  Skin:    General: Skin is warm and dry.  Neurological:     Mental Status: She is alert and oriented to person, place, and time.  Psychiatric:        Behavior: Behavior normal.     Ortho Exam good strength full range of motion negative impingement of her shoulders.  Healed anterior neck incision at the collarbone slightly more prominent than normal.  Specialty Comments:  No specialty comments available.  Imaging: No results found.   PMFS History: Patient Active Problem List   Diagnosis Date Noted   Stress fracture 06/28/2022   Primary osteoarthritis of right knee 06/20/2022   Trigger finger, right ring finger 11/06/2018   History of total right hip replacement 03/20/2018   PONV (postoperative nausea and vomiting) 11/21/2016   Lumbar stenosis  11/21/2016   History of fusion of cervical spine 10/11/2016   Cervical spinal stenosis 09/07/2016   Spinal stenosis of lumbar region with neurogenic claudication 06/01/2016   Cervical radiculopathy 05/12/2016   Spinal stenosis of cervical region 05/12/2016   Cervicalgia 05/12/2016   Ganglion of left hand 12/22/2014   S/P lumbar fusion 04/16/2014   Past Medical History:  Diagnosis Date   Arthritis    hands, back   Arthritis of right hip    Cervical spinal stenosis    Complication of anesthesia    Constipation    Headache    Hot flashes    Hyperlipidemia    Hypertension    Hyperthyroidism    current problem   Irritable bowel syndrome    PAF (paroxysmal atrial fibrillation) (HCC)    declined asnticoagulation   PONV (postoperative nausea and vomiting)    Rosacea    Stroke (HCC) 12/01/2009   tia  2010    Family History  Problem Relation Age  of Onset   Cancer Mother    Uterine cancer Mother    Hypertension Mother    Heart attack Father     Past Surgical History:  Procedure Laterality Date   ABDOMINAL HYSTERECTOMY     ANTERIOR CERVICAL DECOMP/DISCECTOMY FUSION N/A 09/07/2016   Procedure: C7-T1 Anterior Cervical Discectomy and Fusion, Allograft, Plate;  Surgeon: Eldred Manges, MD;  Location: [facility];  Service: Orthopedics;  Laterality: N/A;   BACK SURGERY     CARPAL TUNNEL RELEASE Right 2009   CARPAL TUNNEL RELEASE Left    CATARACT EXTRACTION, BILATERAL     CERVICAL DISCECTOMY  09/07/2016   C7-T1 Anterior Cervical Discectomy and Fusion, Allograft, Plate (N/A)   CHOLECYSTECTOMY     EYE SURGERY     GANGLION CYST EXCISION Left 12/22/2014   Procedure: Excision Left Hand Ganglion cyst;  Surgeon: Eldred Manges, MD;  Location: [facility];  Service: Orthopedics;  Laterality: Left;   HEMORRHOID SURGERY     TOTAL HIP ARTHROPLASTY Right 01/26/2018   Procedure: RIGHT TOTAL HIP ARTHROPLASTY ANTERIOR APPROACH;  Surgeon: Eldred Manges, MD;  Location: [facility];  Service: Orthopedics;  Laterality: Right;   TUBAL LIGATION     Social History   Occupational History   Not on file  Tobacco Use   Smoking status: Former    Types: Cigarettes    Quit date: 04/09/1992    Years since quitting: 30.4   Smokeless tobacco: Never  Vaping Use   Vaping Use: Never used  Substance and Sexual Activity   Alcohol use: Yes    Alcohol/week: 4.0 standard drinks of alcohol    Types: 4 Glasses of wine per week    Comment: 4   Drug use: No   Sexual activity: Not on file

## 2023-01-27 ENCOUNTER — Other Ambulatory Visit: Payer: Self-pay | Admitting: Physician Assistant

## 2023-04-18 ENCOUNTER — Ambulatory Visit: Payer: PPO | Admitting: Orthopaedic Surgery

## 2023-10-18 ENCOUNTER — Encounter (HOSPITAL_BASED_OUTPATIENT_CLINIC_OR_DEPARTMENT_OTHER): Payer: Self-pay

## 2023-10-18 ENCOUNTER — Ambulatory Visit (HOSPITAL_BASED_OUTPATIENT_CLINIC_OR_DEPARTMENT_OTHER)
Admission: EM | Admit: 2023-10-18 | Discharge: 2023-10-18 | Disposition: A | Discharge status: Home / Self Care | Attending: Family Medicine | Admitting: Family Medicine

## 2023-10-18 ENCOUNTER — Ambulatory Visit (HOSPITAL_BASED_OUTPATIENT_CLINIC_OR_DEPARTMENT_OTHER): Admit: 2023-10-18 | Discharge: 2023-10-18 | Disposition: A | Discharge status: Home / Self Care | Admitting: Radiology

## 2023-10-18 DIAGNOSIS — R1031 Right lower quadrant pain: Secondary | ICD-10-CM | POA: Diagnosis present

## 2023-10-18 DIAGNOSIS — R140 Abdominal distension (gaseous): Secondary | ICD-10-CM | POA: Insufficient documentation

## 2023-10-18 LAB — POCT URINE DIPSTICK
Bilirubin, UA: NEGATIVE
Blood, UA: NEGATIVE
Glucose, UA: NEGATIVE mg/dL
Ketones, POC UA: NEGATIVE mg/dL
Nitrite, UA: NEGATIVE
Protein Ur, POC: NEGATIVE mg/dL
Spec Grav, UA: 1.01 (ref 1.010–1.025)
Urobilinogen, UA: 0.2 U/dL
pH, UA: 5.5 (ref 5.0–8.0)

## 2023-10-18 NOTE — ED Provider Notes (Signed)
 [facility]    CSN: [id number] Arrival date & time: 10/18/23  1340      History   Chief Complaint Chief Complaint  Patient presents with   Abdominal Pain    HPI Christina Booker is a 83 y.o. female.   Patient is a 83 year old female who presents today with RLQ abd pain x 3 months. States hasn't been severe until today when leaning over to pick up her dog. States pain so severe, she could hardly sit up. Pain is not present currently.  When it comes is like a dull ache now.  States has had disc problems in her back that presented in a similar way. Denies constipation, denies difficulty with urination. Denies pain when standing up straight. Patient still has appendix. No nausea/vomiting. Denies radiation of pain.   Abdominal Pain   Past Medical History:  Diagnosis Date   Arthritis    hands, back   Arthritis of right hip    Cervical spinal stenosis    Complication of anesthesia    Constipation    Headache    Hot flashes    Hyperlipidemia    Hypertension    Hyperthyroidism    current problem   Irritable bowel syndrome    PAF (paroxysmal atrial fibrillation) (HCC)    declined asnticoagulation   PONV (postoperative nausea and vomiting)    Rosacea    Stroke (HCC) 12/01/2009   tia  2010    Patient Active Problem List   Diagnosis Date Noted   Stress fracture 06/28/2022   Primary osteoarthritis of right knee 06/20/2022   Trigger finger, right ring finger 11/06/2018   History of total right hip replacement 03/20/2018   PONV (postoperative nausea and vomiting) 11/21/2016   Lumbar stenosis 11/21/2016   History of fusion of cervical spine 10/11/2016   Cervical spinal stenosis 09/07/2016   Spinal stenosis of lumbar region with neurogenic claudication 06/01/2016   Cervical radiculopathy 05/12/2016   Spinal stenosis of cervical region 05/12/2016   Cervicalgia 05/12/2016   Ganglion of left hand 12/22/2014   S/P lumbar fusion 04/16/2014    Past Surgical History:   Procedure Laterality Date   ABDOMINAL HYSTERECTOMY     ANTERIOR CERVICAL DECOMP/DISCECTOMY FUSION N/A 09/07/2016   Procedure: C7-T1 Anterior Cervical Discectomy and Fusion, Allograft, Plate;  Surgeon: Barbarann Oneil BROCKS, MD;  Location: [facility] OR;  Service: Orthopedics;  Laterality: N/A;   BACK SURGERY     CARPAL TUNNEL RELEASE Right 2009   CARPAL TUNNEL RELEASE Left    CATARACT EXTRACTION, BILATERAL     CERVICAL DISCECTOMY  09/07/2016   C7-T1 Anterior Cervical Discectomy and Fusion, Allograft, Plate (N/A)   CHOLECYSTECTOMY     EYE SURGERY     GANGLION CYST EXCISION Left 12/22/2014   Procedure: Excision Left Hand Ganglion cyst;  Surgeon: Oneil BROCKS Barbarann, MD;  Location: [facility];  Service: Orthopedics;  Laterality: Left;   HEMORRHOID SURGERY     TOTAL HIP ARTHROPLASTY Right 01/26/2018   Procedure: RIGHT TOTAL HIP ARTHROPLASTY ANTERIOR APPROACH;  Surgeon: Barbarann Oneil BROCKS, MD;  Location: [facility] OR;  Service: Orthopedics;  Laterality: Right;   TUBAL LIGATION      OB History   No obstetric history on file.      Home Medications    Prior to Admission medications   Medication Sig Start Date End Date Taking? Authorizing Provider  aspirin  EC 325 MG EC tablet Take 1 tablet (325 mg total) by mouth daily with breakfast. 01/30/18  Barbarann Oneil BROCKS, MD  atorvastatin  (LIPITOR) 20 MG tablet Take 20 mg by mouth at bedtime.     [provider]  Biotin  5 MG CAPS Take 5 mg by mouth 2 (two) times daily.    [provider]  buPROPion  (WELLBUTRIN  XL) 150 MG 24 hr tablet Take 150 mg by mouth every morning.    [provider]  Cholecalciferol  (VITAMIN D3) 1000 units CAPS Take 1,000 Units by mouth 2 (two) times daily.    [provider]  diphenhydrAMINE (BENADRYL) 25 mg capsule Take 50 mg by mouth at bedtime.    [provider]  gabapentin  (NEURONTIN ) 100 MG capsule TAKE 1 CAPSULE BY MOUTH THREE TIMES DAILY WHEN  NECESSARY  FOR  NEUROPATHY  PAIN.  START  WITH   EVENING  DOSE  AND  ADVANCE  AS  TOLERATED 01/27/23   Barbarann Oneil BROCKS, MD  glycerin  adult 2 g suppository Place 1 suppository rectally as needed for constipation.    [provider]  lisinopril  (PRINIVIL ,ZESTRIL ) 5 MG tablet Take 5 mg by mouth at bedtime.     [provider]  methimazole  (TAPAZOLE ) 5 MG tablet Take 5 mg by mouth daily.    [provider]  pantoprazole  (PROTONIX ) 40 MG tablet Take 40 mg by mouth daily.    [provider]  polyethylene glycol powder (GLYCOLAX /MIRALAX ) powder Take 2.125-4.25 g by mouth daily as needed for moderate constipation.     [provider]  Turmeric Curcumin 500 MG CAPS Take 500 mg by mouth daily.     [provider]  Wheat Dextrin (BENEFIBER PO) Take 15 mLs by mouth daily.     [provider]  zolpidem (AMBIEN) 5 MG tablet Take 2.5-5 mg by mouth at bedtime as needed.    [provider]    Family History Family History  Problem Relation Age of Onset   Cancer Mother    Uterine cancer Mother    Hypertension Mother    Heart attack Father     Social History Social History   Tobacco Use   Smoking status: Former    Current packs/day: 0.00    Types: Cigarettes    Quit date: 04/09/1992    Years since quitting: 31.5   Smokeless tobacco: Never  Vaping Use   Vaping status: Never Used  Substance Use Topics   Alcohol use: Yes    Alcohol/week: 4.0 standard drinks of alcohol    Types: 4 Glasses of wine per week    Comment: 4   Drug use: No     Allergies   Penicillins and Methocarbamol    Review of Systems Review of Systems  Gastrointestinal:  Positive for abdominal pain.     Physical Exam Triage Vital Signs ED Triage Vitals  Encounter Vitals Group     BP 10/18/23 1350 (!) 147/90     Girls Systolic BP Percentile --      Girls Diastolic BP Percentile --      Boys Systolic BP Percentile --      Boys Diastolic BP Percentile --      Pulse Rate 10/18/23 1350 73     Resp  10/18/23 1350 20     Temp 10/18/23 1350 (!) 97.5 F (36.4 C)     Temp Source 10/18/23 1350 Oral     SpO2 10/18/23 1350 98 %     Weight --      Height --      Head Circumference --  Peak Flow --      Pain Score 10/18/23 1352 0     Pain Loc --      Pain Education --      Exclude from Growth Chart --    No data found.  Updated Vital Signs BP (!) 147/90 (BP Location: Right Arm)   Pulse 73   Temp (!) 97.5 F (36.4 C) (Oral)   Resp 20   SpO2 98%   Visual Acuity Right Eye Distance:   Left Eye Distance:   Bilateral Distance:    Right Eye Near:   Left Eye Near:    Bilateral Near:     Physical Exam   UC Treatments / Results  Labs (all labs ordered are listed, but only abnormal results are displayed) Labs Reviewed  POCT URINE DIPSTICK - Abnormal; Notable for the following components:      Result Value   Leukocytes, UA Trace (*)    All other components within normal limits  URINE CULTURE    EKG   Radiology DG Abdomen 1 View Result Date: 10/18/2023 CLINICAL DATA:  abdominal pain EXAM: ABDOMEN - 1 VIEW COMPARISON:  December 01, 2014, Jul 28, 2020 FINDINGS: Nonobstructive bowel gas pattern. No pneumoperitoneum. No organomegaly or radiopaque calculi. Cholecystectomy clips. Multilevel degenerative disc disease of the thoracolumbar spine. Lumbar fusion hardware with interval disc cages. Partially visualized right hip arthroplasty. No acute fracture or destructive lesion. Diffuse aortoiliac atherosclerosis. IMPRESSION: Nonobstructive bowel gas pattern. Electronically Signed   By: Rogelia Myers M.D.   On: 10/18/2023 15:33    Procedures Procedures (including critical care time)  Medications Ordered in UC Medications - No data to display  Initial Impression / Assessment and Plan / UC Course  I have reviewed the triage vital signs and the nursing notes.  Pertinent labs & imaging results that were available during my care of the patient were reviewed by me and considered  in my medical decision making (see chart for details).     RLQ/groin pain- No specific concerns on exam or concerns for appendicitis or other red flags.  This problem has been going on for over 3 months but worsened today when she was bending over.  Most likely muscular in nature.  Could be radiating pain stemming from her lower back and hip.  She has a lot of orthopedic issues. Urinalysis without any concern for infection.  Did have some trace leuks but will send for culture and see if that shows anything.  She can take Tylenol  for pain as needed.  Recommend follow-up with her PCP if the problem continues Final Clinical Impressions(s) / UC Diagnoses   Final diagnoses:  Right lower quadrant abdominal pain     Discharge Instructions      Your urine had trace of bacteria. We can send this for culture and will call if needed.  Your x ray did not show any concerns. I am not sure if this is coming from your back or not. You do have a lot of arthritis and and artificial hip on the right side which could all play a factor. I would follow up with the orthopedic or your PCP if this problem continues. For now I am not seeing anything concerning. You can take Tylenol  for the pain.     ED Prescriptions   None    PDMP not reviewed this encounter.   Adah Wilbert LABOR, FNP 10/18/23 [phone]

## 2023-10-18 NOTE — ED Triage Notes (Signed)
 RLQ abd pain x 3 months. States hasn't been severe until today when leaning over to pick up her dog. States pain so severe, she could hardly sit up. Pain is not present currently. States has had disc problems in her back that presented in a similar way. Denies constipation, denies difficulty with urination. Denies pain when standing up straight. Patient still has appendix. No nausea/vomiting. Denies radiation of pain.

## 2023-10-18 NOTE — Discharge Instructions (Addendum)
 Your urine had trace of bacteria. We can send this for culture and will call if needed.  Your x ray did not show any concerns. I am not sure if this is coming from your back or not. You do have a lot of arthritis and and artificial hip on the right side which could all play a factor. I would follow up with the orthopedic or your PCP if this problem continues. For now I am not seeing anything concerning. You can take Tylenol  for the pain.

## 2023-10-19 LAB — URINE CULTURE: Culture: NO GROWTH

## 2023-10-20 ENCOUNTER — Ambulatory Visit (HOSPITAL_COMMUNITY): Payer: Self-pay
